# Patient Record
Sex: Male | Born: 1940 | Race: White | Hispanic: No | Marital: Married | Smoking: Never smoker
Health system: Southern US, Community
[De-identification: ages and names within clinical notes are randomized; demographics above are authoritative.]

## PROBLEM LIST (undated history)

## (undated) DIAGNOSIS — M1990 Unspecified osteoarthritis, unspecified site: Secondary | ICD-10-CM

## (undated) DIAGNOSIS — R609 Edema, unspecified: Secondary | ICD-10-CM

## (undated) DIAGNOSIS — F419 Anxiety disorder, unspecified: Secondary | ICD-10-CM

## (undated) DIAGNOSIS — Z9221 Personal history of antineoplastic chemotherapy: Secondary | ICD-10-CM

## (undated) DIAGNOSIS — H269 Unspecified cataract: Secondary | ICD-10-CM

## (undated) DIAGNOSIS — C099 Malignant neoplasm of tonsil, unspecified: Secondary | ICD-10-CM

## (undated) DIAGNOSIS — B370 Candidal stomatitis: Secondary | ICD-10-CM

## (undated) DIAGNOSIS — H9190 Unspecified hearing loss, unspecified ear: Secondary | ICD-10-CM

## (undated) DIAGNOSIS — C4491 Basal cell carcinoma of skin, unspecified: Secondary | ICD-10-CM

## (undated) DIAGNOSIS — Z923 Personal history of irradiation: Secondary | ICD-10-CM

## (undated) DIAGNOSIS — J189 Pneumonia, unspecified organism: Secondary | ICD-10-CM

## (undated) DIAGNOSIS — C779 Secondary and unspecified malignant neoplasm of lymph node, unspecified: Secondary | ICD-10-CM

## (undated) DIAGNOSIS — G709 Myoneural disorder, unspecified: Secondary | ICD-10-CM

## (undated) DIAGNOSIS — Z87438 Personal history of other diseases of male genital organs: Secondary | ICD-10-CM

## (undated) DIAGNOSIS — J329 Chronic sinusitis, unspecified: Secondary | ICD-10-CM

## (undated) DIAGNOSIS — K1230 Oral mucositis (ulcerative), unspecified: Secondary | ICD-10-CM

## (undated) DIAGNOSIS — N289 Disorder of kidney and ureter, unspecified: Secondary | ICD-10-CM

## (undated) DIAGNOSIS — Z86010 Personal history of colonic polyps: Secondary | ICD-10-CM

## (undated) DIAGNOSIS — I10 Essential (primary) hypertension: Secondary | ICD-10-CM

## (undated) DIAGNOSIS — T8859XA Other complications of anesthesia, initial encounter: Secondary | ICD-10-CM

## (undated) DIAGNOSIS — E782 Mixed hyperlipidemia: Secondary | ICD-10-CM

## (undated) DIAGNOSIS — K219 Gastro-esophageal reflux disease without esophagitis: Secondary | ICD-10-CM

## (undated) DIAGNOSIS — T7840XA Allergy, unspecified, initial encounter: Secondary | ICD-10-CM

## (undated) HISTORY — DX: Allergy, unspecified, initial encounter: T78.40XA

## (undated) HISTORY — DX: Mixed hyperlipidemia: E78.2

## (undated) HISTORY — DX: Chronic sinusitis, unspecified: J32.9

## (undated) HISTORY — DX: Disorder of kidney and ureter, unspecified: N28.9

## (undated) HISTORY — DX: Basal cell carcinoma of skin, unspecified: C44.91

## (undated) HISTORY — DX: Malignant neoplasm of tonsil, unspecified: C09.9

## (undated) HISTORY — PX: POLYPECTOMY: SHX149

## (undated) HISTORY — DX: Personal history of colonic polyps: Z86.010

## (undated) HISTORY — DX: Candidal stomatitis: B37.0

## (undated) HISTORY — PX: BLEPHAROPLASTY: SUR158

## (undated) HISTORY — DX: Edema, unspecified: R60.9

## (undated) HISTORY — DX: Unspecified osteoarthritis, unspecified site: M19.90

## (undated) HISTORY — DX: Unspecified cataract: H26.9

## (undated) HISTORY — DX: Essential (primary) hypertension: I10

## (undated) HISTORY — PX: CATARACT EXTRACTION, BILATERAL: SHX1313

## (undated) HISTORY — DX: Oral mucositis (ulcerative), unspecified: K12.30

---

## 2002-05-07 ENCOUNTER — Encounter: Payer: Self-pay | Admitting: Urology

## 2002-05-07 ENCOUNTER — Encounter: Admission: RE | Admit: 2002-05-07 | Discharge: 2002-05-07 | Payer: Self-pay | Admitting: Urology

## 2008-05-01 ENCOUNTER — Emergency Department (HOSPITAL_COMMUNITY): Admission: EM | Admit: 2008-05-01 | Discharge: 2008-05-01 | Payer: Self-pay | Admitting: Emergency Medicine

## 2009-07-14 ENCOUNTER — Encounter: Payer: Self-pay | Admitting: Internal Medicine

## 2009-07-14 ENCOUNTER — Encounter (INDEPENDENT_AMBULATORY_CARE_PROVIDER_SITE_OTHER): Payer: Self-pay | Admitting: *Deleted

## 2009-07-26 ENCOUNTER — Ambulatory Visit (HOSPITAL_COMMUNITY): Admission: RE | Admit: 2009-07-26 | Discharge: 2009-07-26 | Payer: Self-pay | Admitting: Emergency Medicine

## 2009-07-26 ENCOUNTER — Encounter: Payer: Self-pay | Admitting: Internal Medicine

## 2009-08-01 ENCOUNTER — Encounter (INDEPENDENT_AMBULATORY_CARE_PROVIDER_SITE_OTHER): Payer: Self-pay | Admitting: *Deleted

## 2009-08-04 ENCOUNTER — Ambulatory Visit: Payer: Self-pay | Admitting: Internal Medicine

## 2009-08-21 ENCOUNTER — Ambulatory Visit: Payer: Self-pay | Admitting: Internal Medicine

## 2009-08-25 DIAGNOSIS — R1013 Epigastric pain: Secondary | ICD-10-CM | POA: Insufficient documentation

## 2009-08-27 ENCOUNTER — Encounter: Payer: Self-pay | Admitting: Internal Medicine

## 2009-08-28 ENCOUNTER — Ambulatory Visit (HOSPITAL_COMMUNITY): Admission: RE | Admit: 2009-08-28 | Discharge: 2009-08-28 | Payer: Self-pay | Admitting: Internal Medicine

## 2009-08-30 HISTORY — PX: COLONOSCOPY: SHX174

## 2011-06-02 LAB — COMPREHENSIVE METABOLIC PANEL
Alkaline Phosphatase: 79
BUN: 15
Glucose, Bld: 96
Potassium: 3.8
Total Protein: 6.8

## 2011-06-02 LAB — POCT I-STAT, CHEM 8
HCT: 45
Hemoglobin: 15.3
Potassium: 3.6
Sodium: 131 — ABNORMAL LOW
TCO2: 29

## 2011-06-02 LAB — CBC
HCT: 43.4
Hemoglobin: 14.7
MCHC: 33.8
RDW: 12.8

## 2011-06-02 LAB — POCT CARDIAC MARKERS
CKMB, poc: 1.4
Myoglobin, poc: 89.6
Troponin i, poc: <0.05

## 2011-06-02 LAB — DIFFERENTIAL
Basophils Absolute: 0.1
Eosinophils Absolute: 0.1
Eosinophils Relative: 1
Lymphocytes Relative: 10 — ABNORMAL LOW
Lymphs Abs: 1
Neutrophils Relative %: 81 — ABNORMAL HIGH

## 2011-09-17 LAB — PULMONARY FUNCTION TEST

## 2012-06-22 ENCOUNTER — Encounter: Payer: Self-pay | Admitting: *Deleted

## 2012-06-23 ENCOUNTER — Encounter: Payer: Self-pay | Admitting: Cardiovascular Disease

## 2012-06-23 ENCOUNTER — Ambulatory Visit (INDEPENDENT_AMBULATORY_CARE_PROVIDER_SITE_OTHER): Payer: Medicare Other | Admitting: Cardiovascular Disease

## 2012-06-23 VITALS — BP 149/74 | HR 60 | Resp 18 | Ht 69.0 in | Wt 190.0 lb

## 2012-06-23 DIAGNOSIS — R1013 Epigastric pain: Secondary | ICD-10-CM

## 2012-06-23 DIAGNOSIS — E785 Hyperlipidemia, unspecified: Secondary | ICD-10-CM

## 2012-06-23 DIAGNOSIS — I10 Essential (primary) hypertension: Secondary | ICD-10-CM

## 2012-06-23 LAB — BASIC METABOLIC PANEL
Calcium: 9.1 mg/dL (ref 8.4–10.5)
GFR: 77.24 mL/min (ref >60.00)
Glucose, Bld: 107 mg/dL — ABNORMAL HIGH (ref 70–99)
Potassium: 3.9 mEq/L (ref 3.5–5.1)
Sodium: 132 mEq/L — ABNORMAL LOW (ref 135–145)

## 2012-06-23 LAB — LIPID PANEL
HDL: 33.6 mg/dL — ABNORMAL LOW (ref >39.00)
LDL Cholesterol: 58 mg/dL (ref 0–99)
Total CHOL/HDL Ratio: 3
VLDL: 20.6 mg/dL (ref 0.0–40.0)

## 2012-06-23 LAB — HEPATIC FUNCTION PANEL
Albumin: 3.9 g/dL (ref 3.5–5.2)
Alkaline Phosphatase: 53 U/L (ref 39–117)
Bilirubin, Direct: 0.1 mg/dL (ref 0.0–0.3)
Total Bilirubin: 0.4 mg/dL (ref 0.3–1.2)

## 2012-06-23 MED ORDER — HYDROCHLOROTHIAZIDE 25 MG PO TABS: 25.0000 mg | ORAL_TABLET | Freq: Every day | ORAL | Status: DC

## 2012-06-23 MED ORDER — POTASSIUM CHLORIDE ER 10 MEQ PO TBCR: 10.0000 meq | EXTENDED_RELEASE_TABLET | Freq: Every day | ORAL | Status: DC

## 2012-06-23 NOTE — Assessment & Plan Note (Signed)
Omar Sawyer seems  to be doing well. His blood pressure is a little bit elevated today. We will increase his HCTZ to 25 mg a day. I would like to add potassium chloride 10 mEq a day.  We discussed the importance of starting a regular exercise program.  We will Draw fasting lab work including lipid profile, hepatic profile, and basic metabolic profile. I'll see him in 3 months for followup office visit and basic metabolic profile.

## 2012-06-23 NOTE — Patient Instructions (Addendum)
Your physician recommends that you schedule a follow-up appointment in: 3 Months  Your physician has recommended you make the following change in your medication:   INCREASE HCTZ TO 25MG  IN THE MORNING START POTASSIUM DAILY WITH HCTZ  Your physician recommends that you return for lab work in: TODAY AND IN 3 MONTHS/BMET  REDUCE HIGH SODIUM FOODS LIKE CANNED SOUP, GRAVY, SAUCES, READY PREPARED FOODS LIKE FROZEN FOODS; LEAN CUISINE, LASAGNA. BACON, SAUSAGE, LUNCH MEAT, FAST FOODS.Marland Kitchen   DASH Diet The DASH diet stands for "Dietary Approaches to Stop Hypertension." It is a healthy eating plan that has been shown to reduce high blood pressure (hypertension) in as little as 14 days, while also possibly providing other significant health benefits. These other health benefits include reducing the risk of breast cancer after menopause and reducing the risk of type 2 diabetes, heart disease, colon cancer, and stroke. Health benefits also include weight loss and slowing kidney failure in patients with chronic kidney disease.  DIET GUIDELINES  Limit salt (sodium). Your diet should contain less than 1500 mg of sodium daily.  Limit refined or processed carbohydrates. Your diet should include mostly whole grains. Desserts and added sugars should be used sparingly.  Include small amounts of heart-healthy fats. These types of fats include nuts, oils, and tub margarine. Limit saturated and trans fats. These fats have been shown to be harmful in the body. CHOOSING FOODS  The following food groups are based on a 2000 calorie diet. See your Registered Dietitian for individual calorie needs. Grains and Grain Products (6 to 8 servings daily)  Eat More Often: Whole-wheat bread, brown rice, whole-grain or wheat pasta, quinoa, popcorn without added fat or salt (air popped).  Eat Less Often: White bread, white pasta, white rice, cornbread. Vegetables (4 to 5 servings daily)  Eat More Often: Fresh, frozen, and  canned vegetables. Vegetables may be raw, steamed, roasted, or grilled with a minimal amount of fat.  Eat Less Often/Avoid: Creamed or fried vegetables. Vegetables in a cheese sauce. Fruit (4 to 5 servings daily)  Eat More Often: All fresh, canned (in natural juice), or frozen fruits. Dried fruits without added sugar. One hundred percent fruit juice ( cup [237 mL] daily).  Eat Less Often: Dried fruits with added sugar. Canned fruit in light or heavy syrup. [employer], Fish, and Poultry (2 servings or less daily. One serving is 3 to 4 oz [85-114 g]).  Eat More Often: Ninety percent or leaner ground beef, tenderloin, sirloin. Round cuts of beef, chicken breast, [place] breast. All fish. Grill, bake, or broil your meat. Nothing should be fried.  Eat Less Often/Avoid: Fatty cuts of meat, [place], or chicken leg, thigh, or wing. Fried cuts of meat or fish. Dairy (2 to 3 servings)  Eat More Often: Low-fat or fat-free milk, low-fat plain or light yogurt, reduced-fat or part-skim cheese.  Eat Less Often/Avoid: Milk (whole, 2%).Whole milk yogurt. Full-fat cheeses. Nuts, Seeds, and Legumes (4 to 5 servings per week)  Eat More Often: All without added salt.  Eat Less Often/Avoid: Salted nuts and seeds, canned beans with added salt. Fats and Sweets (limited)  Eat More Often: Vegetable oils, tub margarines without trans fats, sugar-free gelatin. Mayonnaise and salad dressings.  Eat Less Often/Avoid: Coconut oils, palm oils, butter, stick margarine, cream, half and half, cookies, candy, pie. FOR MORE INFORMATION The Dash Diet Eating Plan: www.dashdiet.org Document Released: 08/05/2011 Document Revised: 11/08/2011 Document Reviewed: 08/05/2011 [facility] Patient Information 2013 [place], [place].

## 2012-06-23 NOTE — Progress Notes (Signed)
    Omar Sawyer Date of Birth  1940/12/15       Omar Sawyer    [facility] [address]  [address] [place], [place]  [address]   [place], [place]  [address] [phone]     [phone]   Fax  [phone]    Fax [phone]  Problem List: 1. Hypertension 2. Hyperlipidemia  History of Present Illness:  Omar Sawyer is a 71 year old gentleman with a long history of hypertension and hyperlipidemia. He's the son of a previous patient of mine. [name].  He presents today for continued management of his hypertension.  He avoids salt.  He does eat out quite a bit.  He is still working Retail banker).  He does not get any regular exercise.    He denies any chest pain or dyspnea with exertion.    Current Outpatient Prescriptions on File Prior to Visit  Medication Sig Dispense Refill  . amLODipine (NORVASC) 5 MG tablet Take 5 mg by mouth daily.       . hydrochlorothiazide (MICROZIDE) 12.5 MG capsule Take 12.5 mg by mouth daily.       Marland Kitchen lisinopril (PRINIVIL,ZESTRIL) 20 MG tablet       . metoprolol succinate (TOPROL-XL) 50 MG 24 hr tablet Take 50 mg by mouth daily. Take 1 1/2 tablets by mouth daily.      Marland Kitchen omeprazole (PRILOSEC) 20 MG capsule Take 20 mg by mouth daily.       . sildenafil (VIAGRA) 100 MG tablet Take 100 mg by mouth daily as needed.      . simvastatin (ZOCOR) 20 MG tablet Take 1 tablet by mouth daily.        No Known Allergies  Past Medical History  Diagnosis Date  . Essential hypertension, benign   . Mixed hyperlipidemia   . Unspecified sinusitis (chronic)   . Wheezing   . Edema   . Shortness of breath     No past surgical history on file.  History  Smoking status  . Never Smoker   Smokeless tobacco  . Not on file    History  Alcohol Use: Not on file    No family history on file.  Reviw of Systems:  Reviewed in the HPI.  All other systems are negative.  Physical Exam: Blood pressure 149/74, pulse 60,  resp. rate 18, height 5\' 9"  (1.753 m), weight 190 lb (86.183 kg), SpO2 98.00%. General: Well developed, well nourished, in no acute distress.  Head: Normocephalic, atraumatic, sclera non-icteric, mucus membranes are moist,   Neck: Supple. Carotids are 2 + without bruits. No JVD  Lungs: Clear bilaterally to auscultation.  Heart: regular rate.  normal  S1 S2. No murmurs, gallops or rubs.  Abdomen: Soft, non-tender, non-distended with normal bowel sounds. No hepatomegaly. No rebound/guarding. No masses.  Msk:  Strength and tone are normal  Extremities: No clubbing or cyanosis. No edema.  Distal pedal pulses are 2+ and equal bilaterally.  Neuro: Alert and oriented X 3. Moves all extremities spontaneously.  Psych:  Responds to questions appropriately with a normal affect.  ECG: 06/23/2012-normal sinus rhythm at 60 beats a minute. He has poor R-wave progression that is probably due to lead placement. His EKG is otherwise normal.  Assessment / Plan:

## 2012-07-03 NOTE — Progress Notes (Signed)
Received another copy

## 2012-07-03 NOTE — Progress Notes (Signed)
Received another copy 

## 2012-07-06 ENCOUNTER — Other Ambulatory Visit: Payer: Self-pay | Admitting: Internal Medicine

## 2012-07-07 ENCOUNTER — Encounter: Payer: Self-pay | Admitting: Internal Medicine

## 2012-07-23 ENCOUNTER — Encounter: Payer: Self-pay | Admitting: Internal Medicine

## 2012-07-23 DIAGNOSIS — Z860100 Personal history of colon polyps, unspecified: Secondary | ICD-10-CM

## 2012-07-23 HISTORY — DX: Personal history of colonic polyps: Z86.010

## 2012-07-23 HISTORY — DX: Personal history of colon polyps, unspecified: Z86.0100

## 2012-09-28 ENCOUNTER — Encounter: Payer: Self-pay | Admitting: Cardiovascular Disease

## 2012-09-28 ENCOUNTER — Ambulatory Visit (INDEPENDENT_AMBULATORY_CARE_PROVIDER_SITE_OTHER): Payer: Medicare Other | Admitting: Cardiovascular Disease

## 2012-09-28 VITALS — BP 110/68 | HR 78 | Ht 69.0 in | Wt 192.6 lb

## 2012-09-28 DIAGNOSIS — E785 Hyperlipidemia, unspecified: Secondary | ICD-10-CM

## 2012-09-28 DIAGNOSIS — I10 Essential (primary) hypertension: Secondary | ICD-10-CM

## 2012-09-28 LAB — BASIC METABOLIC PANEL
BUN: 18 mg/dL (ref 6–23)
GFR: 62.07 mL/min (ref >60.00)
Potassium: 3.7 mEq/L (ref 3.5–5.1)

## 2012-09-28 NOTE — Progress Notes (Signed)
Maureen Ralphs Date of Birth  March 31, 1941       [facility]    [place] [address]  [address] [place], [place]  [address]   [place], [place]  [address] [phone]     [phone]   Fax  [phone]    Fax [phone]  Problem List: 1. Hypertension 2. Hyperlipidemia  History of Present Illness:  Densel is a 72 year old gentleman with a long history of hypertension and hyperlipidemia. He's the son of a previous patient of mine. [name].  He presents today for continued management of his hypertension.  He avoids salt.  He does eat out quite a bit.  He is still working Retail banker).  He does not get any regular exercise.    He denies any chest pain or dyspnea with exertion.    September 28, 2012: He was started on HCTZ and his BP has been better.  He is having some prostate issues.  He denies any CP, syncope or presyncope.  Current Outpatient Prescriptions on File Prior to Visit  Medication Sig Dispense Refill  . amLODipine (NORVASC) 5 MG tablet Take 5 mg by mouth daily.       . Cholecalciferol (VITAMIN D-3) 1000 UNITS CAPS Take 1 tablet by mouth daily.      . fish oil-omega-3 fatty acids 1000 MG capsule Take 2 g by mouth daily.      . hydrochlorothiazide (HYDRODIURIL) 25 MG tablet Take 1 tablet (25 mg total) by mouth daily.  90 tablet  3  . lisinopril (PRINIVIL,ZESTRIL) 20 MG tablet Take 20 mg by mouth daily.       . metoprolol succinate (TOPROL-XL) 50 MG 24 hr tablet Take 50 mg by mouth daily. Take 1 1/2 tablets by mouth daily.      Marland Kitchen omeprazole (PRILOSEC) 20 MG capsule Take 20 mg by mouth daily.       . potassium chloride (K-DUR) 10 MEQ tablet Take 1 tablet (10 mEq total) by mouth daily.  30 tablet  6  . sildenafil (VIAGRA) 100 MG tablet Take 100 mg by mouth daily as needed.      . simvastatin (ZOCOR) 20 MG tablet Take 1 tablet by mouth daily.      . Tamsulosin HCl (FLOMAX) 0.4 MG CAPS Take 0.4 mg by mouth daily.          No Known Allergies  Past Medical History  Diagnosis Date  . Essential hypertension, benign   . Mixed hyperlipidemia   . Unspecified sinusitis (chronic)   . Wheezing   . Edema   . Shortness of breath   . Personal history of adenomatous colonic polyps 07/23/2012    2010    Past Surgical History  Procedure Date  . Colonoscopy     History  Smoking status  . Never Smoker   Smokeless tobacco  . Not on file    History  Alcohol Use: Not on file    No family history on file.  Reviw of Systems:  Reviewed in the HPI.  All other systems are negative.  Physical Exam: Blood pressure 110/68, pulse 78, height 5\' 9"  (1.753 m), weight 192 lb 9.6 oz (87.363 kg), SpO2 96.00%. General: Well developed, well nourished, in no acute distress.  Head: Normocephalic, atraumatic, sclera non-icteric, mucus membranes are moist,   Neck: Supple. Carotids are 2 + without bruits. No JVD  Lungs: Clear bilaterally to auscultation.  Heart: regular rate.  normal  S1 S2. No murmurs, gallops or rubs.  Abdomen: Soft, non-tender, non-distended with normal bowel sounds. No hepatomegaly. No rebound/guarding. No masses.  Msk:  Strength and tone are normal  Extremities: No clubbing or cyanosis. No edema.  Distal pedal pulses are 2+ and equal bilaterally.  Neuro: Alert and oriented X 3. Moves all extremities spontaneously.  Psych:  Responds to questions appropriately with a normal affect.  ECG:  Assessment / Plan:

## 2012-09-28 NOTE — Assessment & Plan Note (Signed)
He's doing very well from a cardiac standpoint. His blood pressure is well-controlled. He is tolerating the HCTZ and potassium. We'll send him for basic metabolic profile today. I seen again in 6 months for followup office visit.  We'll check a fasting lipid profile, lipid profile, and basic metabolic profile

## 2012-09-28 NOTE — Assessment & Plan Note (Signed)
We will check a fasting lipid profile, liver enzymes, and BMP in 6 months.

## 2012-09-28 NOTE — Patient Instructions (Addendum)
Your physician wants you to follow-up in: 6 Months You will receive a reminder letter in the mail two months in advance. If you don't receive a letter, please call our office to schedule the follow-up appointment.  Your physician recommends that you return for a FASTING lipid profile: 6 months  Your physician recommends that you continue on your current medications as directed. Please refer to the Current Medication list given to you today.  Your physician recommends that you return for a FASTING lipid profile: TODAY

## 2012-09-29 ENCOUNTER — Telehealth: Payer: Self-pay | Admitting: Cardiovascular Disease

## 2012-09-29 NOTE — Telephone Encounter (Signed)
Pt rtn call to jodette

## 2012-10-03 ENCOUNTER — Other Ambulatory Visit: Payer: Self-pay | Admitting: Otolaryngology

## 2012-10-03 HISTORY — PX: OTHER SURGICAL HISTORY: SHX169

## 2012-10-03 NOTE — Telephone Encounter (Signed)
PT WAS CALLED WITH LAB RESULTS

## 2012-10-14 ENCOUNTER — Other Ambulatory Visit: Payer: Self-pay

## 2012-10-18 ENCOUNTER — Other Ambulatory Visit (HOSPITAL_COMMUNITY): Payer: Self-pay | Admitting: Otolaryngology

## 2012-10-18 DIAGNOSIS — IMO0002 Reserved for concepts with insufficient information to code with codable children: Secondary | ICD-10-CM

## 2012-10-26 ENCOUNTER — Ambulatory Visit (HOSPITAL_COMMUNITY)
Admission: RE | Admit: 2012-10-26 | Discharge: 2012-10-26 | Disposition: A | Discharge status: Home / Self Care | Payer: Medicare Other | Source: Ambulatory Visit | Attending: Otolaryngology | Admitting: Otolaryngology

## 2012-10-26 ENCOUNTER — Encounter (HOSPITAL_COMMUNITY): Payer: Self-pay

## 2012-10-26 ENCOUNTER — Encounter: Payer: Self-pay | Admitting: Radiation Oncology

## 2012-10-26 ENCOUNTER — Encounter (HOSPITAL_COMMUNITY)
Admission: RE | Admit: 2012-10-26 | Discharge: 2012-10-26 | Disposition: A | Discharge status: Home / Self Care | Payer: Medicare Other | Source: Ambulatory Visit | Attending: Otolaryngology | Admitting: Otolaryngology

## 2012-10-26 DIAGNOSIS — C099 Malignant neoplasm of tonsil, unspecified: Secondary | ICD-10-CM | POA: Insufficient documentation

## 2012-10-26 DIAGNOSIS — C770 Secondary and unspecified malignant neoplasm of lymph nodes of head, face and neck: Secondary | ICD-10-CM | POA: Insufficient documentation

## 2012-10-26 DIAGNOSIS — IMO0002 Reserved for concepts with insufficient information to code with codable children: Secondary | ICD-10-CM

## 2012-10-26 LAB — GLUCOSE, CAPILLARY: Glucose-Capillary: 99 mg/dL (ref 70–99)

## 2012-10-26 MED ORDER — FLUDEOXYGLUCOSE F - 18 (FDG) INJECTION
19.2000 | Freq: Once | INTRAVENOUS | Status: AC | PRN
  Administered 2012-10-26: 19.2 via INTRAVENOUS

## 2012-10-26 MED ORDER — IOHEXOL 300 MG/ML  SOLN: 100.0000 mL | Freq: Once | INTRAMUSCULAR | Status: AC | PRN

## 2012-10-27 ENCOUNTER — Encounter: Payer: Self-pay | Admitting: Radiation Oncology

## 2012-10-27 ENCOUNTER — Ambulatory Visit
Admission: RE | Admit: 2012-10-27 | Discharge: 2012-10-27 | Disposition: A | Discharge status: Home / Self Care | Payer: Medicare Other | Source: Ambulatory Visit | Attending: Radiation Oncology | Admitting: Radiation Oncology

## 2012-10-27 ENCOUNTER — Telehealth: Payer: Self-pay | Admitting: Oncology

## 2012-10-27 VITALS — BP 151/61 | HR 68 | Temp 97.7°F | Resp 20 | Ht 69.0 in | Wt 195.9 lb

## 2012-10-27 DIAGNOSIS — C099 Malignant neoplasm of tonsil, unspecified: Secondary | ICD-10-CM

## 2012-10-27 DIAGNOSIS — Z79899 Other long term (current) drug therapy: Secondary | ICD-10-CM | POA: Insufficient documentation

## 2012-10-27 DIAGNOSIS — B977 Papillomavirus as the cause of diseases classified elsewhere: Secondary | ICD-10-CM | POA: Insufficient documentation

## 2012-10-27 DIAGNOSIS — I10 Essential (primary) hypertension: Secondary | ICD-10-CM | POA: Insufficient documentation

## 2012-10-27 DIAGNOSIS — K219 Gastro-esophageal reflux disease without esophagitis: Secondary | ICD-10-CM | POA: Insufficient documentation

## 2012-10-27 DIAGNOSIS — C779 Secondary and unspecified malignant neoplasm of lymph node, unspecified: Secondary | ICD-10-CM | POA: Insufficient documentation

## 2012-10-27 DIAGNOSIS — E782 Mixed hyperlipidemia: Secondary | ICD-10-CM | POA: Insufficient documentation

## 2012-10-27 DIAGNOSIS — C090 Malignant neoplasm of tonsillar fossa: Secondary | ICD-10-CM | POA: Insufficient documentation

## 2012-10-27 HISTORY — DX: Unspecified hearing loss, unspecified ear: H91.90

## 2012-10-27 HISTORY — DX: Secondary and unspecified malignant neoplasm of lymph node, unspecified: C77.9

## 2012-10-27 HISTORY — DX: Gastro-esophageal reflux disease without esophagitis: K21.9

## 2012-10-27 HISTORY — DX: Anxiety disorder, unspecified: F41.9

## 2012-10-27 HISTORY — DX: Myoneural disorder, unspecified: G70.9

## 2012-10-27 HISTORY — DX: Personal history of other diseases of male genital organs: Z87.438

## 2012-10-27 NOTE — Telephone Encounter (Signed)
C/D 10/27/12 for appt. 11/02/12

## 2012-10-27 NOTE — Progress Notes (Signed)
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

## 2012-10-27 NOTE — Progress Notes (Signed)
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

## 2012-10-27 NOTE — Progress Notes (Signed)
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 male who presented with symptoms suggestive of persistent sinusitis. He tried different rounds of antibiotics, but in spite of these he had persistent tooth, maxillary pain and retro-orbital pain. These symptoms were mainly on the left side. He was seen by Dr. Jearld Fenton of otolaryngology. At assessment, the patient also had some mild sore throat. The patient was examined and found to have an erythematous right tonsil with exudate on its surface. It was much larger than the left tonsil. This examination was on 09/15/2012. The patient was seen for reassessment on 09/29/2012. At that time his sinus symptoms had improved with clindamycin. However his right tonsil was still irregular and enlarged. The patient was scheduled for biopsy. This took place on February 4, revealing strongly/diffusely HPV positive squamous cell carcinoma. PET scan performed yesterday demonstrates a right tonsil mass, 4.0 cm in greatest dimension, with evidence of level II and level III adenopathy in the right neck  The patient denies any history of tobacco abuse. He reports that he did treat with heavily over 20 years ago. He reports that he has had a soreness in his right throat that waxes and wanes. It is not severe. He reports that his left sinusitis symptoms have gone away. He denies any dysphagia or ear pain. He did have  a gastrointestinal virus in January and lost 15 pounds at that time period... has not gained that weight weight back. He has bilateral hearing loss after serving in the [employer] and does have bilateral hearing aids. He denies any  prior cancers or radiotherapy.  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    PAST SURGICAL HISTORY: Past Surgical History  Procedure Laterality Date  . Colonoscopy    . Biopsy of right tonsil Right 10/03/2012    Squamous Cell Carcinoma     . Blepharoplasty      FAMILY HISTORY: family history includes Diabetes in his mother; Heart attack in an unspecified family member; Heart disease in his mother; Hyperlipidemia in his mother; and Hypertension in his brother, father, and mother.  SOCIAL HISTORY:  reports that he has never smoked. He has never used smokeless tobacco. He reports that he does not drink alcohol or use illicit drugs.  ALLERGIES: Review of patient's allergies indicates no known allergies.  MEDICATIONS:  Current Outpatient Prescriptions  Medication Sig Dispense Refill  . amLODipine (NORVASC) 5 MG tablet Take 5 mg by mouth daily.       . Cholecalciferol (VITAMIN D-3) 1000 UNITS CAPS Take 1 tablet by mouth daily.      . fish oil-omega-3 fatty acids 1000 MG capsule Take 2 g by mouth daily.      . hydrochlorothiazide (HYDRODIURIL) 25 MG tablet Take 1 tablet (25 mg total) by mouth daily.  90 tablet  3  . lisinopril (PRINIVIL,ZESTRIL) 20 MG  tablet Take 20 mg by mouth daily.       . metoprolol succinate (TOPROL-XL) 50 MG 24 hr tablet Take 50 mg by mouth daily. Take 1 1/2 tablets by mouth daily.      Marland Kitchen omeprazole (PRILOSEC) 20 MG capsule Take 20 mg by mouth daily.       . potassium chloride (K-DUR) 10 MEQ tablet Take 1 tablet (10 mEq total) by mouth  daily.  30 tablet  6  . sildenafil (VIAGRA) 100 MG tablet Take 100 mg by mouth daily as needed.      . simvastatin (ZOCOR) 20 MG tablet Take 1 tablet by mouth daily.      . Tamsulosin HCl (FLOMAX) 0.4 MG CAPS Take 0.4 mg by mouth daily.       Marland Kitchen doxycycline (VIBRA-TABS) 100 MG tablet Take 100 mg by mouth 2 (two) times daily.        No current facility-administered medications for this encounter.    REVIEW OF SYSTEMS:  As above   PHYSICAL EXAM:  height is 5\' 9"  (1.753 m) and weight is 195 lb 14.4 oz (88.86 kg). His oral temperature is 97.7 F (36.5 C). His blood pressure is 151/61 and his pulse is 68. His respiration is 20 and oxygen saturation is 99%.   General: Alert and oriented, in no acute distress HEENT: Head is normocephalic. Extraocular movements are intact. Oropharynx is notable for an enlarged, erythematous right tonsil. No trismus. Tongue is midline. No palpable or visible lesions elsewhere in the oropharynx. Dentition in fairly good repair, with multiple fillings.  Neck: Slight fullness in the right level II region, otherwise no detectable lymphadenopathy in the neck Heart: Regular in rate and rhythm  Chest: Clear to auscultation bilaterally, with no rhonchi, wheezes, or rales. Abdomen: Soft, nontender, nondistended, with no rigidity or guarding. Extremities: No cyanosis or edema. Lymphatics: As above  Skin: No concerning lesions. Musculoskeletal: symmetric strength and muscle tone throughout. Neurologic: Cranial nerves II through XII are grossly intact. No obvious focalities. Speech is fluent. Coordination is intact. Slight tremor in his hands bilaterally when signing forms Psychiatric: Judgment and insight are intact. Affect is appropriate.   LABORATORY DATA:  Lab Results  Component Value Date   WBC 9.8 05/01/2008   HGB 15.3 05/01/2008   HCT 45.0 05/01/2008   MCV 91.4 05/01/2008   PLT 209 05/01/2008   CMP     Component Value Date/Time   NA 132* 09/28/2012 0916   K 3.7  09/28/2012 0916   CL 91* 09/28/2012 0916   CO2 30 09/28/2012 0916   GLUCOSE 116* 09/28/2012 0916   BUN 18 09/28/2012 0916   CREATININE 1.2 09/28/2012 0916   CALCIUM 9.2 09/28/2012 0916   PROT 7.5 06/23/2012 1037   ALBUMIN 3.9 06/23/2012 1037   AST 24 06/23/2012 1037   ALT 35 06/23/2012 1037   ALKPHOS 53 06/23/2012 1037   BILITOT 0.4 06/23/2012 1037   GFRNONAA >60 05/01/2008 1947   GFRAA  Value: >60        The eGFR has been calculated using the MDRD equation. This calculation has not been validated in all clinical 05/01/2008 1947         RADIOGRAPHY: Ct Soft Tissue Neck W Contrast  10/26/2012  *RADIOLOGY REPORT*  Clinical Data: 72 year old male with right tonsil squamous cell carcinoma diagnosed this month.  CT NECK WITH CONTRAST  Technique:  Multidetector CT imaging of the neck was performed with intravenous contrast.  Contrast:  100 ml Omnipaque-300.  Comparison: PET  CT from the same day reported separately.  Findings:  Negative lung apices.  No superior mediastinal lymphadenopathy.  Negative thyroid, retropharyngeal space, left parapharyngeal space, sublingual space, submandibular glands, parotid glands, visible orbit soft tissues, and visualized brain parenchyma. Visualized paranasal sinuses and mastoids are clear. No acute osseous abnormality identified.  Incidental torus mandibularis.  Heterogeneously enhancing soft tissue mass centered at the right tonsillar pillar and inseparable from the right soft palate encompasses 29 x 27 x 40 mm (AP by transverse by CC). Associated blunting of the right glossotonsillar sulcus.  Tongue base and vallecula otherwise normal.  Epiglottis, hypopharynx and larynx are within normal limits.  Pathologic right level IIA lymph node measures 17 mm short axis (26 mm long axis).  There is also an abnormally heterogeneous lymph node at level III on the right along the posterior margin of the right sternocleidomastoid muscle measuring 8 mm short axis (23 mm long axis).  There  are intervening normal/tiny other level II and level III nodes.  No level I lymphadenopathy.  Small level IV nodes appear symmetric and within normal limits.  No left side cervical lymphadenopathy.  IMPRESSION: Right tonsil oropharyngeal mass compatible with squamous cell carcinoma measuring up to 40 mm largest dimension.  Imaging stage is T2 N2b (abnormal right level II and level III lymph nodes).   Original Report Authenticated By: Erskine Speed, M.D.    Nm Pet Image Initial (pi) Skull Base To Thigh  10/26/2012  *RADIOLOGY REPORT*  Clinical Data: Initial treatment strategy for squamous cell carcinoma of the tonsil.  NUCLEAR MEDICINE PET SKULL BASE TO THIGH  Fasting Blood Glucose:  99  Technique:  19.2 mCi F-18 FDG was injected intravenously. CT data was obtained and used for attenuation correction and anatomic localization only.  (This was not acquired as a diagnostic CT examination.) Additional exam technical data entered on technologist worksheet.  Comparison:  Next CT 10/26/2012 next the  Findings:  Neck: There is hypermetabolic right tonsillar mass measuring approximately 2.9 x 2.5 cm with SUV max = 17.6.  There is an enlarge hypermetabolic level II A lymph node measuring 18 mm short axis (image 29) with SUV max = 11.2.  There is a smaller less hypermetabolic level III lymph node on the right measuring 7 mm short axis (image 42) SUV max = 3.8.  There are no discrete hypermetabolic lymph nodes on the left.  Chest:  No hypermetabolic mediastinal or hilar nodes.  No suspicious pulmonary nodules on the CT scan.  Abdomen/Pelvis:  No abnormal hypermetabolic activity within the liver, pancreas, adrenal glands, or spleen.  No hypermetabolic lymph nodes in the abdomen or pelvis. There are small.  Several portal lymph nodes measuring less than 10 mm each (image 136) without discrete hypermetabolic activity.  Prostate gland is enlarged.  Skeleton:  No focal hypermetabolic activity to suggest skeletal metastasis.   IMPRESSION:  1.  Hypermetabolic right tonsillar mass consistent primary carcinoma. 2.  Hypermetabolic nodal metastasis to the right level IIa and level III lymph nodes. 3.  No evidence of contralateral nodal metastasis. 4.  No evidence of distant metastatic disease.   Original Report Authenticated By: Genevive Bi, M.D.       IMPRESSION/PLAN: This is a very pleasant 72 year old gentleman with clinical T2 N2b M0 right tonsil squamous cell carcinoma, HPV positive. He is an excellent candidate for radiotherapy. I discussed all of the below with the patient and his wife.  1) We will place the patient on next week's  tumor board for multidisciplinary review  2) Will refer the patient to dentistry to discuss whether he needs dental extractions, and also for trismus prevention. Patient will discuss dental hygiene/management/prophylaxis with Dr. Kristin Bruins  3) Will refer to social work for social support  4) Will refer to nutrition for nutrition support  5) Will refer to interventional radiology for PEG tube placement, as the risk of dehydration malnutrition is relatively high for patients receiving chemoradiotherapy for head and neck cancer.   6) Will refer to swallowing therapy for dysphagia prevention  7) Date of simulation TBD after discussion with Dentistry - I told the patient that I anticipate he will be planned to receive 7 weeks of radiotherapy, 35 treatments.   8) The patient was offered enrollment on our single institutional trial investigating open-faced vs close-face head/shoulder masks used to immobilize patients during head and neck radiotherapy. The patient has elected to enroll on this trial.  In regards to the trial, the patient has voluntarily signed copies of the consent forms and all trial related questions were answered.  9) I discussed with the patient the potential benefits of concurrent chemotherapy. He will be referred to medical oncology for consultation  It was a pleasure  meeting the patient today. We discussed the risks, benefits, and side effects of radiotherapy. He understands adjuvant radiotherapy will give him the best chance of cure and local regional control.  We discussed the good prognostic implications of HPV positivity in non-tobacco abusers. We talked in detail about acute and late effects. He understands that some of the most bothersome acute effects will be significant soreness of the mouth and throat, changes in taste, changes in salivary function, skin irritation, hair loss, dehydration, weight loss and fatigue. We talked about late effects which include but are not necessarily limited to dysphagia, hypothyroidism, dry mouth, trismus, nerve or spinal cord injury, dental/jaw injury, and neck edema. No guarantees of treatment were given. A consent form was signed and placed in the patient's medical record. The patient is enthusiastic about proceeding with treatment. I look forward to participating in the patient's care..   I spent 60 minutes minutes face to face with the patient and more than 50% of that time was spent in counseling and/or coordination of care.    __________________________________________   Lonie Peak, MD

## 2012-10-30 ENCOUNTER — Encounter (HOSPITAL_COMMUNITY): Payer: Self-pay | Admitting: Dentistry

## 2012-10-30 ENCOUNTER — Ambulatory Visit (HOSPITAL_COMMUNITY): Payer: Self-pay | Admitting: Dentistry

## 2012-10-30 VITALS — BP 121/67 | HR 64 | Temp 98.1°F

## 2012-10-30 DIAGNOSIS — K011 Impacted teeth: Secondary | ICD-10-CM

## 2012-10-30 DIAGNOSIS — C099 Malignant neoplasm of tonsil, unspecified: Secondary | ICD-10-CM

## 2012-10-30 DIAGNOSIS — Z0189 Encounter for other specified special examinations: Secondary | ICD-10-CM

## 2012-10-30 DIAGNOSIS — K036 Deposits [accretions] on teeth: Secondary | ICD-10-CM

## 2012-10-30 DIAGNOSIS — K0530 Chronic periodontitis, unspecified: Secondary | ICD-10-CM

## 2012-10-30 DIAGNOSIS — M264 Malocclusion, unspecified: Secondary | ICD-10-CM

## 2012-10-30 NOTE — Patient Instructions (Addendum)

## 2012-10-30 NOTE — Progress Notes (Signed)
DENTAL CONSULTATION  Date of Consultation:  10/30/2012 Patient Name:   Omar Sawyer Date of Birth:   1941/06/25 Medical Record Number: [id number]  VITALS: BP 121/67  Pulse 64  Temp(Src) 98.1 F (36.7 C) (Oral)   CHIEF COMPLAINT: Patient referred for medically necessary preradiation therapy dental protocol evaluation.  HPI: Omar Sawyer is a 72 year old male referred by Dr. Lonie Peak for a dental consultation. Patient with recent diagnosis of squamous cell carcinoma of the right tonsil. Patient with anticipated chemoradiation therapy. Patient is now seen as part of a pre-chemoradiation therapy dental protocol evaluation.   Patient currently denies acute toothache, swellings, or abscesses. Patient was last seen in January 2014 for exam and cleaning. This was with Dr. Milinda Antis. Patient is seen on an every 6 month basis. Patient has a lower left molar that needs some type of restoration.  Patient Active Problem List  Diagnosis  . ABDOMINAL PAIN, EPIGASTRIC  . HTN (hypertension)  . Hyperlipidemia  . Personal history of adenomatous colonic polyps  . Cancer  . Tonsil cancer    PMH: Past Medical History  Diagnosis Date  . Essential hypertension, benign   . Mixed hyperlipidemia   . Unspecified sinusitis (chronic)   . Wheezing   . Edema   . Shortness of breath   . Personal history of adenomatous colonic polyps 07/23/2012    2010  . Hearing loss   . Hx of prostatitis   . Hx of peripheral neuropathy   . Tonsillar cancer   . 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 Anxiety     mild new dx  . GERD (gastroesophageal reflux disease)   . Neuromuscular disorder     b/l tremors,     PSH: Past Surgical History  Procedure Laterality Date  . Colonoscopy    . Biopsy of right tonsil Right 10/03/2012     Squamous Cell Carcinoma     . Blepharoplasty      ALLERGIES:. No Known Allergies  MEDICATIONS: Current Outpatient Prescriptions  Medication Sig Dispense Refill  . amLODipine (NORVASC) 5 MG tablet Take 5 mg by mouth daily.       . Cholecalciferol (VITAMIN D-3) 1000 UNITS CAPS Take 1 tablet by mouth daily.      Marland Kitchen doxycycline (VIBRA-TABS) 100 MG tablet Take 100 mg by mouth 2 (two) times daily.       . fish oil-omega-3 fatty acids 1000 MG capsule Take 2 g by mouth daily.      . hydrochlorothiazide (HYDRODIURIL) 25 MG tablet Take 1 tablet (25 mg total) by mouth daily.  90 tablet  3  . lisinopril (PRINIVIL,ZESTRIL) 20 MG tablet Take 20 mg by mouth daily.       . metoprolol succinate (TOPROL-XL) 50 MG 24 hr tablet Take 50 mg by mouth daily. Take 1 1/2 tablets by mouth daily.      Marland Kitchen omeprazole (PRILOSEC) 20 MG capsule Take 20 mg by mouth daily.       . potassium chloride (K-DUR) 10 MEQ tablet Take 1 tablet (10 mEq total) by mouth daily.  30 tablet  6  . sildenafil (VIAGRA) 100 MG tablet Take 100 mg by mouth daily as needed.      . simvastatin (ZOCOR) 20 MG tablet Take 1 tablet  by mouth daily.      . Tamsulosin HCl (FLOMAX) 0.4 MG CAPS Take 0.4 mg by mouth daily.        No current facility-administered medications for this visit.    LABS: Lab Results  Component Value Date   WBC 9.8 05/01/2008   HGB 15.3 05/01/2008   HCT 45.0 05/01/2008   MCV 91.4 05/01/2008   PLT 209 05/01/2008      Component Value Date/Time   NA 132* 09/28/2012 0916   K 3.7 09/28/2012 0916   CL 91* 09/28/2012 0916   CO2 30 09/28/2012 0916   GLUCOSE 116* 09/28/2012 0916   BUN 18 09/28/2012 0916   CREATININE 1.2 09/28/2012 0916   CALCIUM 9.2 09/28/2012 0916   GFRNONAA >60 05/01/2008 1947   GFRAA  Value: >60        The eGFR has been calculated using the MDRD equation. This calculation has not been validated in all clinical 05/01/2008 1947   No results found for this basename: INR, PROTIME   No results found for this basename: PTT     SOCIAL HISTORY: History   Social History  . Marital Status: Married    Spouse Name: N/A    Number of Children: N/A  . Years of Education: N/A   Occupational History  . Not on file.   Social History Main Topics  . Smoking status: Never Smoker   . Smokeless tobacco: Never Used  . Alcohol Use: No  . Drug Use: No  . Sexually Active: Not on file   Other Topics Concern  . Not on file   Social History Narrative  . No narrative on file    FAMILY HISTORY: Family History  Problem Relation Age of Onset  . Heart disease Mother   . Hypertension Mother   . Diabetes Mother   . Hyperlipidemia Mother   . Hypertension Father   . Hypertension Brother   . Heart attack       REVIEW OF SYSTEMS: Reviewed with patient and postive as above.  DENTAL HISTORY: CHIEF COMPLAINT: Patient referred for medically necessary preradiation therapy dental protocol evaluation.  HPI: Omar Sawyer is a 72 year old male referred by Dr. Lonie Peak for a dental consultation. Patient with recent diagnosis of squamous cell carcinoma of the right tonsil. Patient with anticipated chemoradiation therapy. Patient is now seen as part of a pre-chemoradiation therapy dental protocol evaluation.   Patient currently denies acute toothache, swellings, or abscesses. Patient was last seen in January 2014 for exam and cleaning. This was with Dr. Milinda Antis. Patient is seen on an every 6 month basis. Patient has a lower left molar that needs some type of restoration.  DENTAL EXAMINATION:  GENERAL:  The patient is a well-developed, well-nourished male in no acute distress. HEAD AND NECK: Patient with right neck lymphadenopathy. No left neck lymphadenopathy is palpated The patient denies acute TMJ symptoms. INTRAORAL EXAM:Patient with normal saliva. Patient has large, multilobular mandibular lingual tori. DENTITION: Patient is missing tooth numbers 16 and 17. Tooth numbers 1 and 32 are impacted.   PERIODONTAL: Patient with chronic periodontitis with minimal plaque accumulations. There is incipient to moderate bone loss. No excessive tooth mobility is noted. DENTAL CARIES/SUBOPTIMAL RESTORATIONS: Tooth #19 with a distal buccal fractured cusp in need of restoration. ENDODONTIC:  The patient currently denies acute pulpitis symptoms. There is no evidence of periapical pathology.  CROWN AND BRIDGE: The patient has several crown restorations that appear to be acceptable.  PROSTHODONTIC: There  are no partial dentures.  OCCLUSION: Patient with a poor occlusal scheme secondary to multiple malpositioned teeth. The occlusion is stable however.  RADIOGRAPHIC INTERPRETATION: An orthopantogram was obtained along with a full series of dental radiographs.  There are missing tooth numbers 16 and 17. Patient has impacted tooth numbers 1 and 32. There is incipient to moderate bone loss. There are radiopacities consistent with the large, multilobular mandibular lingual tori involving the mandibular arch.  ASSESSMENTS: 1. Chronic periodontitis with bone loss 2. Accretions-minimal  3. Selective areas of gingival recession 4. Lack of attached gingiva in the mandibular anterior area 5. No significant tooth mobility 6. Missing tooth numbers 16 and 17 7. Impacted tooth numbers 1 and 32 8. Large bilateral mandibular lingual tori 9. deep overbite 10. Multiple malpositioned teeth. 11. Poor occlusal scheme but stable occlusion 12. Defect of distal buccal cusp of restoration #19   PLAN/RECOMMENDATIONS: 1. I discussed the risks, benefits, and complications of various treatment options with the patient in relationship to his medical and dental conditions, anticipated chemoradiation therapy, and chemoradiation therapy side effects to include xerostomia, mucositis, taste changes, gum and jawbone changes, and risk for infection, bleeding, and osteonecrosis. We discussed various treatment options to include no  treatment, extraction of teeth in primary field of radiation therapy with alveoloplasty, referral to an oral surgeon for second opinion concerning extraction of teeth in the primary field of radiation therapy, pre-prosthetic surgery as indicated, periodontal therapy, dental restorations, root canal therapy, crown and bridge therapy, implant therapy, and replacement of missing teeth as indicated. We also discussed obtaining impressions for fabrication of fluoride trays and scatter protection devices.The patient currently wishes to proceed with impressions today for the future fabrication of fluoride trays and scatter protection devices. The patient is thinking about obtaining a second opinion from an oral surgeon concerning extraction of teeth in primary field iof radiation therapy.  I have also left a message with Dr. Shelda Altes (primary Dentist) to obtain her opinion concerning whether teeth in the primary field radiation therapy should be extracted at this time.   2. Discussion of findings with medical team and coordination of future medical and dental care as indicated.  Charlynne Pander, DDS

## 2012-10-31 ENCOUNTER — Encounter (HOSPITAL_COMMUNITY): Payer: Self-pay | Admitting: Dentistry

## 2012-10-31 ENCOUNTER — Ambulatory Visit (HOSPITAL_COMMUNITY): Payer: Self-pay | Admitting: Dentistry

## 2012-10-31 ENCOUNTER — Other Ambulatory Visit: Payer: Self-pay | Admitting: Radiology

## 2012-10-31 VITALS — BP 125/77 | HR 61 | Temp 97.6°F

## 2012-10-31 DIAGNOSIS — C099 Malignant neoplasm of tonsil, unspecified: Secondary | ICD-10-CM

## 2012-10-31 DIAGNOSIS — Z463 Encounter for fitting and adjustment of dental prosthetic device: Secondary | ICD-10-CM

## 2012-10-31 DIAGNOSIS — Z0189 Encounter for other specified special examinations: Secondary | ICD-10-CM

## 2012-10-31 MED ORDER — SODIUM FLUORIDE 1.1 % DT GEL: DENTAL | Status: AC

## 2012-10-31 NOTE — Progress Notes (Signed)
10/31/2012  Patient:            Omar Sawyer Date of Birth:  Mar 07, 1941 MRN:                [id number]  BP 125/77  Pulse 61  Temp(Src) 97.6 F (36.4 C) (Oral)  Omar Sawyer now presents for insertion of upper lower fluoride trays and scatter protection devices.  PROCEDURE: Appliances were tried in and adjusted as needed. [place]. Postop instructions were provided and a written and verbal format concerning the use and care of appliances. All questions were answered. Patient to return to clinic for periodic oral examination in approximately 2-3 weeks during radiation therapy. Patient to call if questions or problems arise before then.   Charlynne Pander, DDS

## 2012-10-31 NOTE — Patient Instructions (Signed)
FLUORIDE TRAYS PATIENT INSTRUCTIONS    Obtain prescription from the pharmacy.  Don't be surprised if it needs to be ordered.   Be sure to let the pharmacy know when you are close to needing a new refill for them to have it ready for you without interruption of Fluoride use.   The best time to use your Fluoride is before bed time.   You must brush your teeth very well and floss before using the Fluoride in order to get the best use out of the Fluoride treatments.   Place 1 drop of Fluoride gel per tooth in the tray.   Place the tray on your lower teeth and/or your upper teeth.  Make sure the trays are seated all the way.  Remember, they only fit one way on your teeth.   Insert for 5 full minutes.   At the end of the 5 minutes, take the trays out.  SPIT OUT excess. .    Do NOT rinse your mouth!    Do NOT eat or drink after treatments for at least 30 minutes.  This is why the best time for your treatments is before bedtime.    Clean the inside of your Fluoride trays using COLD WATER and a toothbrush.    In order to keep your Trays from discoloring and free from odors, soak them overnight in denture cleaners such as Efferdent.  Do not use bleach or non denture products.    Store the trays in a safe dry place AWAY from any heat until your next treatment.    Bring the trays with you for your next dental check-up.  The dentist will confirm their fit.    If anything happens to your Fluoride trays, or they don't fit as well after any dental work, please let us know as soon as possible.

## 2012-11-01 ENCOUNTER — Inpatient Hospital Stay
Admission: RE | Admit: 2012-11-01 | Discharge: 2012-11-01 | Disposition: A | Discharge status: Home / Self Care | Payer: Self-pay | Source: Ambulatory Visit | Attending: Radiation Oncology | Admitting: Radiation Oncology

## 2012-11-01 ENCOUNTER — Telehealth: Payer: Self-pay | Admitting: Radiation Oncology

## 2012-11-01 ENCOUNTER — Ambulatory Visit
Admission: RE | Admit: 2012-11-01 | Discharge: 2012-11-01 | Disposition: A | Discharge status: Home / Self Care | Payer: Medicare Other | Source: Ambulatory Visit | Attending: Radiation Oncology | Admitting: Radiation Oncology

## 2012-11-01 DIAGNOSIS — Y842 Radiological procedure and radiotherapy as the cause of abnormal reaction of the patient, or of later complication, without mention of misadventure at the time of the procedure: Secondary | ICD-10-CM | POA: Insufficient documentation

## 2012-11-01 DIAGNOSIS — Z510 Encounter for antineoplastic radiation therapy: Secondary | ICD-10-CM | POA: Insufficient documentation

## 2012-11-01 DIAGNOSIS — Z931 Gastrostomy status: Secondary | ICD-10-CM | POA: Insufficient documentation

## 2012-11-01 DIAGNOSIS — Z79899 Other long term (current) drug therapy: Secondary | ICD-10-CM | POA: Insufficient documentation

## 2012-11-01 DIAGNOSIS — K1231 Oral mucositis (ulcerative) due to antineoplastic therapy: Secondary | ICD-10-CM | POA: Insufficient documentation

## 2012-11-01 DIAGNOSIS — B370 Candidal stomatitis: Secondary | ICD-10-CM | POA: Insufficient documentation

## 2012-11-01 DIAGNOSIS — H9190 Unspecified hearing loss, unspecified ear: Secondary | ICD-10-CM | POA: Insufficient documentation

## 2012-11-01 DIAGNOSIS — C099 Malignant neoplasm of tonsil, unspecified: Secondary | ICD-10-CM | POA: Insufficient documentation

## 2012-11-01 NOTE — Progress Notes (Signed)
Simulation, IMRT treatment planning, and Special treatment procedure note   OUTPATIENT  Diagnosis: head and neck cancer - RIGHT tonsil The patient was taken to the CT simulator and laid in the supine position on the table. An Aquaplast head and shoulder mask was custom fitted to his anatomy. High-resolution CT axial imaging was obtained of the head and neck with contrast. I verified that the quality of the imaging is good for treatment planning.   Treatment planning note I plan to treat the patient with helical Tomotherapy, IMRT. I plan to treat the patient's primary tumor and bilateral neck nodes. I plan to treat to a total dose of 70 Gray in 35 fractions   IMRT planning Note  IMRT is an important modality to deliver adequate dose to the patient's at risk tissues while sparing the patient's normal structures, including the: Parotid tissue, mandible, brain stem, spinal cord, oral cavity, brachial plexus .  This justifies the use of IMRT in the patient's treatment.   Special Treatment Procedure Note:  The patient will be receiving chemotherapy concurrently. Chemotherapy heightens the risk of side effects. I have considered this during the patient's treatment planning process and will monitor the patient accordingly for side effects on a weekly basis. Concurrent chemotherapy increases the complexity of this patient's treatment and therefore this constitutes a special treatment procedure.  -----------------------------------  Lonie Peak, MD

## 2012-11-01 NOTE — Telephone Encounter (Signed)
Met w patient to discuss RO billing. Pt had no financial concerns today.  Dx: Tonsil cancer - Primary 146.0   Attending Rad: SS   Rad Tx: IMRT

## 2012-11-01 NOTE — Progress Notes (Signed)
IV started in left outer arm, lateral to the antecubital space with a 22 gauge angiocath without any resistance.  Brisk blood retrun.  Flushed without any resistance or swelling .  Patient denied any pain.  Escorted to simulation at 12:12pm.  BUN and Creat from 09/28/12 WNL.  He is not a diabetic.

## 2012-11-02 ENCOUNTER — Encounter: Payer: Self-pay | Admitting: Oncology

## 2012-11-02 ENCOUNTER — Telehealth: Payer: Self-pay | Admitting: Oncology

## 2012-11-02 ENCOUNTER — Ambulatory Visit (HOSPITAL_BASED_OUTPATIENT_CLINIC_OR_DEPARTMENT_OTHER): Payer: Medicare Other | Admitting: Oncology

## 2012-11-02 ENCOUNTER — Ambulatory Visit (HOSPITAL_BASED_OUTPATIENT_CLINIC_OR_DEPARTMENT_OTHER): Payer: Medicare Other

## 2012-11-02 ENCOUNTER — Other Ambulatory Visit (HOSPITAL_BASED_OUTPATIENT_CLINIC_OR_DEPARTMENT_OTHER): Payer: Medicare Other | Admitting: Lab

## 2012-11-02 ENCOUNTER — Telehealth: Payer: Self-pay | Admitting: *Deleted

## 2012-11-02 VITALS — BP 128/76 | HR 69 | Temp 97.6°F | Resp 18 | Ht 69.0 in | Wt 194.2 lb

## 2012-11-02 DIAGNOSIS — B977 Papillomavirus as the cause of diseases classified elsewhere: Secondary | ICD-10-CM

## 2012-11-02 DIAGNOSIS — C099 Malignant neoplasm of tonsil, unspecified: Secondary | ICD-10-CM

## 2012-11-02 LAB — COMPREHENSIVE METABOLIC PANEL (CC13)
ALT: 48 U/L (ref 0–55)
AST: 28 U/L (ref 5–34)
Albumin: 3.9 g/dL (ref 3.5–5.0)
CO2: 28 mEq/L (ref 22–29)
Calcium: 9.5 mg/dL (ref 8.4–10.4)
Chloride: 99 mEq/L (ref 98–107)
Creatinine: 1.1 mg/dL (ref 0.7–1.3)
Potassium: 3.8 mEq/L (ref 3.5–5.1)
Total Protein: 7.4 g/dL (ref 6.4–8.3)

## 2012-11-02 LAB — CBC WITH DIFFERENTIAL/PLATELET
BASO%: 0.6 % (ref 0.0–2.0)
EOS%: 3 % (ref 0.0–7.0)
HCT: 44.3 % (ref 38.4–49.9)
HGB: 15.1 g/dL (ref 13.0–17.1)
MCHC: 34 g/dL (ref 32.0–36.0)
MONO#: 0.6 10*3/uL (ref 0.1–0.9)
NEUT%: 68.2 % (ref 39.0–75.0)
RDW: 13.8 % (ref 11.0–14.6)
WBC: 7.7 10*3/uL (ref 4.0–10.3)
lymph#: 1.6 10*3/uL (ref 0.9–3.3)

## 2012-11-02 MED ORDER — ONDANSETRON HCL 8 MG PO TABS: 8.0000 mg | ORAL_TABLET | Freq: Two times a day (BID) | ORAL | Status: DC | PRN

## 2012-11-02 MED ORDER — PROCHLORPERAZINE MALEATE 10 MG PO TABS: 10.0000 mg | ORAL_TABLET | Freq: Four times a day (QID) | ORAL | Status: DC | PRN

## 2012-11-02 MED ORDER — LORAZEPAM 0.5 MG PO TABS: 0.5000 mg | ORAL_TABLET | Freq: Four times a day (QID) | ORAL | Status: DC | PRN

## 2012-11-02 NOTE — Patient Instructions (Addendum)
1.  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  2.  Treatment options:  *  Upfront resection followed by adjuvant radiation +/- chemotherapy.  Without adjuvant therapy, there chance of recurrence can be up to 30-40%.   *  Upfront chemoradiation.  Surgery is then reserved for residual cancer.  Chance of response to upfront chemoradiation is about 80-90%.  Chance of cure with HPV positive cancer is about 70% at 5 years.  3.  Reason to add chemo to radiation:  Improve response, decrease recurrence, increase overall survival by about 5% as opposed to no chemo. 4.  Different types of chemo:  Cisplatin versus Cetuximab (aka Erbitux).  Side effects of cisplatin include but not limited to nausea vomiting, hair loss, fatigue, mouthsore, hearing impairment, kidney dysfunction, abnormal electrolytes, low blood count, risk of bleeding and infection.  Side effects of Erbitux include but not limited to skin rash, electrolytes abnormality, infusion reaction, diarrhea.  5.  There is no definitive data to support one over the other right now.  The caveat is that Cisplatin has been around the longest and by Limmie Schoenberg is considered to be standard of care.  Outside of clinical trial, I reserve Erbitux for patients who are in very poor shape to get chemo.  6.  There is a randomized clinical trial for patients with HPV positive oropharynx cancer.  Everybody receives radiation.  One group gets cisplatin; the other, Erbitux.

## 2012-11-02 NOTE — Telephone Encounter (Signed)
Per staff message and POF I have scheduled appts.  JMW  

## 2012-11-02 NOTE — Progress Notes (Signed)
[facility]  Telephone:([phone] [phone]   MEDICAL ONCOLOGY - INITIAL CONSULATION    Referral MD:  Omar Sawyer, M.D.  Reason for Referral: newly diagnosed cT2 N2b M0 right tonsil squamous cell carcinoma.    HPI:  [name] is a 72 year-old man with no history of smoking.  He developed about 6 weeks ago sinusitis.  He was referred to Dr. Suzanna Sawyer.  His symptoms improved with a course of empiric antibiotic. However, he was noted to have fungating right tonsil mass.  Upon repeat evaluation, the mass was still there.  On 10/03/2012, he had biopsy of the tonsil mass with path case # [id number] consistent with squamous cell carcinoma, p16 positive.  A neck CT on 10/26/2012 showed right tonsil mass (40mm) and right level IIA node at 17mm; and a level III node at 8mm (but 23mm long axis).  A PET scan on 10/26/2012 showed uptake at right tonsil mass with SUV 17; right level II A node SUV 11; right leve II node SUV 3.8. There was no evidence of metastatic disease.  He was kindly referred to the Cancer for evaluation.   Omar Sawyer presented to the clinic for the first time today with his wife.  He reported resolution of sinus draining, congestion, or pain.  He has mild discomfort in the right tonsil/base of tongue.  He does not notice the cervical nodes or any other location.  He still works full time a a Medical illustrator. Patient denies fever, anorexia, weight loss, fatigue, headache, visual changes, confusion, drenching night sweats, mucositis, odynophagia, dysphagia, nausea vomiting, jaundice, chest pain, palpitation, shortness of breath, dyspnea on exertion, productive cough, gum bleeding, epistaxis, hematemesis, hemoptysis, abdominal pain, abdominal swelling, early satiety, melena, hematochezia, hematuria, skin rash, spontaneous bleeding, joint swelling, joint pain, heat or cold intolerance, bowel bladder incontinence, back pain, focal motor weakness, paresthesia,  depression.      Past Medical History  Diagnosis Date  . Essential hypertension, benign   . Mixed hyperlipidemia   . Unspecified sinusitis (chronic)   . Edema   . Personal history of adenomatous colonic polyps 07/23/2012    2010  . Hearing loss   . Hx of prostatitis   . 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 Anxiety     mild new dx  . GERD (gastroesophageal reflux disease)   . Neuromuscular disorder     b/l tremors,   . Basal cell carcinoma   :  Past Surgical History  Procedure Laterality Date  . Colonoscopy  2011    polyps. Dr. Leone Sawyer.  Due now 2013  . Biopsy of right tonsil Right 10/03/2012    Squamous Cell Carcinoma     . Blepharoplasty    :  Current Outpatient Prescriptions  Medication Sig Dispense Refill  . amLODipine (NORVASC) 5 MG tablet Take 5 mg by mouth daily.       Marland Kitchen aspirin 81 MG tablet Take 81 mg by mouth daily.      . Cholecalciferol (VITAMIN D-3) 1000 UNITS CAPS Take 1 tablet by mouth daily.      . fish oil-omega-3 fatty acids 1000 MG capsule Take 2 g by mouth daily.      . hydrochlorothiazide (HYDRODIURIL) 25 MG tablet Take 25 mg by mouth  every other day.      . lisinopril (PRINIVIL,ZESTRIL) 20 MG tablet Take 20 mg by mouth daily.       . metoprolol succinate (TOPROL-XL) 50 MG 24 hr tablet Take 50 mg by mouth daily. Take 1 1/2 tablets by mouth daily.      Marland Kitchen omeprazole (PRILOSEC) 20 MG capsule Take 20 mg by mouth daily.       . potassium chloride (K-DUR) 10 MEQ tablet Take 1 tablet (10 mEq total) by mouth daily.  30 tablet  6  . sildenafil (VIAGRA) 100 MG tablet Take 100 mg by mouth daily as needed.      . simvastatin (ZOCOR) 20 MG tablet Take 1 tablet by mouth daily.      . sodium fluoride (FLUORISHIELD) 1.1 % GEL dental gel Brush and floss. Instill one drop of fluoride into fluoride tray.  Place over teeth for 5 minutes. Remove. Spit out excess. Repeat nightly  120 mL  prn  . Tamsulosin HCl (FLOMAX) 0.4 MG CAPS Take 0.4 mg by mouth daily.        No current facility-administered medications for this visit.     No Known Allergies:  Family History  Problem Relation Age of Onset  . Heart disease Mother   . Hypertension Mother   . Diabetes Mother   . Hyperlipidemia Mother   . Hypertension Father   . Heart disease Father   . Hypertension Brother   . Heart attack    . Cancer Sister     breast cancer  :  History   Social History  . Marital Status: Married    Spouse Name: N/A    Number of Children: 1  . Years of Education: N/A   Occupational History  .      working as a Copywriter, advertising; Lobbyist    Social History Main Topics  . Smoking status: Never Smoker   . Smokeless tobacco: Never Used  . Alcohol Use: No  . Drug Use: No  . Sexually Active: Not on file   Other Topics Concern  . Not on file   Social History Narrative  . No narrative on file  : Exam: ECOG 0.   General:  well-nourished in no acute distress.  Eyes:  no scleral icterus.  ENT:  There was a large, fungating, nonbleeding right tonsil mass. Neck was without thyromegaly.  Lymphatics:  Positive for right cervical level II node about 2cm.  I could not appreciate the right level III node.  There was no supraclavicular or axillary adenopathy.  Respiratory: lungs were clear bilaterally without wheezing or crackles.  Cardiovascular:  Regular rate and rhythm, S1/S2, without murmur, rub or gallop.  There was no pedal edema.  GI:  abdomen was soft, flat, nontender, nondistended, without organomegaly.  Muscoloskeletal:  no spinal tenderness of palpation of vertebral spine.  Skin exam was without echymosis, petichae.  Neuro exam was nonfocal.  Patient was able to get on and off exam table without assistance.  Gait was normal.  Patient was alerted and oriented.  Attention was good.   Language  was appropriate.  Mood was normal without depression.  Speech was not pressured.  Thought content was not tangential.     Lab Results  Component Value Date   WBC 7.7 11/02/2012   HGB 15.1 11/02/2012   HCT 44.3 11/02/2012   PLT 194 11/02/2012   GLUCOSE 106* 11/02/2012   CHOL 112 06/23/2012   TRIG 103.0 06/23/2012   HDL 33.60*  06/23/2012   LDLCALC 58 06/23/2012   ALT 48 11/02/2012   AST 28 11/02/2012   NA 138 11/02/2012   K 3.8 11/02/2012   CL 99 11/02/2012   CREATININE 1.1 11/02/2012   BUN 18.0 11/02/2012   CO2 28 11/02/2012     Assessment and Plan:    1.  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  2.  Treatment options:  *  Upfront resection followed by adjuvant radiation +/- chemotherapy.  Without adjuvant therapy, there chance of recurrence can be up to 30-40%.   *  Upfront chemoradiation.  Surgery is then reserved for residual cancer.  Chance of response to upfront chemoradiation is about 80-90%.  Chance of cure with HPV positive cancer is about 70% at 5 years.   Mr. Dallman would like to proceed with definitive concurrent chemoradiation.  There is no definitive data to support the use of induction chemo in patients without massive tumour burden and HPV positive.   3.  Reason to add chemo to radiation:  Improve response, decrease recurrence, increase overall survival by about 5% as opposed to no chemo. 4.  Different types of chemo:  Cisplatin versus Cetuximab (aka Erbitux).  Side effects of cisplatin include but not limited to nausea vomiting, hair loss, fatigue, mouthsore, hearing impairment, kidney dysfunction, abnormal electrolytes, low blood count, risk of bleeding and infection.  Side effects of Erbitux include but not limited to skin rash, electrolytes abnormality, infusion reaction, diarrhea.  5.  There is no definitive data to support one over the other right now.  The caveat is that Cisplatin has been around the longest and by default is considered to be  standard of care.  Outside of clinical trial, I reserve Erbitux for patients who are in very poor shape to get chemo.  6.  There is a randomized clinical trial RTOG 1016 for patients with HPV positive oropharynx cancer.  Mr. Dalziel was not interested in the trial since he did not want to be getting cetuximab every week.   In preparation for therapy, Dr. Basilio Cairo had referred patient to PEG tube, dental, Speech. He is due to start radiation on 11/13/2012.  I will see him that day to answer last minute question before starting therapy.  He was referred to chemo class.  I prescribed Ativan/Zofran/Compazine prn.   The length of time of the face-to-face encounter was 60 minutes. More than 50% of time was spent counseling and coordination of care.     Thank you for this referral.

## 2012-11-02 NOTE — Progress Notes (Signed)
Checked in new patient. No financial issues. °

## 2012-11-02 NOTE — Telephone Encounter (Signed)
Gave pt appt for chemo class then MD visit with chemo for MArch and April 2014

## 2012-11-03 ENCOUNTER — Ambulatory Visit (HOSPITAL_COMMUNITY): Admission: RE | Admit: 2012-11-03 | Payer: Medicare Other | Source: Ambulatory Visit

## 2012-11-03 ENCOUNTER — Encounter: Payer: Self-pay | Admitting: Nutrition

## 2012-11-05 ENCOUNTER — Other Ambulatory Visit: Payer: Self-pay | Admitting: Radiology

## 2012-11-06 ENCOUNTER — Ambulatory Visit: Payer: Medicare Other

## 2012-11-06 ENCOUNTER — Ambulatory Visit: Payer: Medicare Other | Admitting: Nutrition

## 2012-11-06 ENCOUNTER — Ambulatory Visit: Payer: Medicare Other | Attending: Radiation Oncology

## 2012-11-06 ENCOUNTER — Other Ambulatory Visit: Payer: Medicare Other

## 2012-11-06 DIAGNOSIS — R1311 Dysphagia, oral phase: Secondary | ICD-10-CM | POA: Insufficient documentation

## 2012-11-06 DIAGNOSIS — IMO0001 Reserved for inherently not codable concepts without codable children: Secondary | ICD-10-CM | POA: Insufficient documentation

## 2012-11-06 NOTE — Progress Notes (Signed)
This is a 72 year old male patient of Dr. Gaylyn Rong and Dr. Basilio Cairo diagnosed with tonsil cancer to receive concurrent chemoradiation therapy.  Past medical history includes hypertension, hyperlipidemia, edema, prostatitis, anxiety, and GERD.  Medications include vitamin D 3, omega-3 fatty acids, Ativan, Prilosec, Zofran, K-Dur, Compazine, and Zocor.  Labs include glucose of 106 on March 6.  Height: 69 inches. Weight: 194.2 pounds. Usual body weight 190 pounds in October 2013. BMI 28.67.  Estimated nutrition needs: 2200-2400 calories, 115-130 g protein, 2.4 L fluid.  Patient and wife present to nutrition assessment. Patient is receiving his PEG tomorrow. He has completed chemotherapy teaching class. He is interested in information on eating with tonsil cancer.  Nutrition diagnosis: Food and nutrition related knowledge deficit related to new diagnosis of tonsil cancer and associated treatments as evidenced by no prior need for nutrition related information.  Intervention: I've educated patient and his wife on strategies for consuming a high protein, high calorie diet to achieve weight maintenance throughout treatment. I've reviewed high-protein foods with patient and encouraged him to consume 6 meals or snacks daily. I have briefly educated him on strategies for eating with nausea and vomiting as well as constipation. I've encouraged increased fluid intake. I've encouraged him to take nausea medications as needed. I have briefly reviewed methods for feeding through his feeding tube. I provided fact sheets, oral nutrition supplement samples, and my contact information. I've answered his questions. Teach back method used.  Monitoring, evaluation, goals: Patient will tolerate adequate calories and protein to minimize weight loss. Patient will begin tube feedings once weight loss exceeds 5% of his usual body weight.  Next visit: Monday, March 17, during chemotherapy.

## 2012-11-07 ENCOUNTER — Ambulatory Visit (HOSPITAL_COMMUNITY)
Admission: RE | Admit: 2012-11-07 | Discharge: 2012-11-07 | Disposition: A | Discharge status: Home / Self Care | Payer: Medicare Other | Source: Ambulatory Visit | Attending: Radiation Oncology | Admitting: Radiation Oncology

## 2012-11-07 ENCOUNTER — Encounter (HOSPITAL_COMMUNITY): Payer: Self-pay

## 2012-11-07 DIAGNOSIS — Z79899 Other long term (current) drug therapy: Secondary | ICD-10-CM | POA: Insufficient documentation

## 2012-11-07 DIAGNOSIS — I10 Essential (primary) hypertension: Secondary | ICD-10-CM | POA: Insufficient documentation

## 2012-11-07 DIAGNOSIS — E782 Mixed hyperlipidemia: Secondary | ICD-10-CM | POA: Insufficient documentation

## 2012-11-07 DIAGNOSIS — K219 Gastro-esophageal reflux disease without esophagitis: Secondary | ICD-10-CM | POA: Insufficient documentation

## 2012-11-07 DIAGNOSIS — C099 Malignant neoplasm of tonsil, unspecified: Secondary | ICD-10-CM

## 2012-11-07 LAB — CBC
HCT: 44.9 % (ref 39.0–52.0)
MCHC: 33.9 g/dL (ref 30.0–36.0)
Platelets: 198 10*3/uL (ref 150–400)
RDW: 13.1 % (ref 11.5–15.5)
WBC: 6.8 10*3/uL (ref 4.0–10.5)

## 2012-11-07 LAB — APTT: aPTT: 32 seconds (ref 24–37)

## 2012-11-07 LAB — PROTIME-INR
INR: 1.09 (ref 0.00–1.49)
Prothrombin Time: 14 seconds (ref 11.6–15.2)

## 2012-11-07 MED ORDER — FENTANYL CITRATE 0.05 MG/ML IJ SOLN
INTRAMUSCULAR | Status: AC
  Filled 2012-11-07: qty 6

## 2012-11-07 MED ORDER — SODIUM CHLORIDE 0.9 % IV SOLN: INTRAVENOUS | Status: DC

## 2012-11-07 MED ORDER — MIDAZOLAM HCL 2 MG/2ML IJ SOLN
INTRAMUSCULAR | Status: AC
  Filled 2012-11-07: qty 6

## 2012-11-07 MED ORDER — FENTANYL CITRATE 0.05 MG/ML IJ SOLN
INTRAMUSCULAR | Status: AC | PRN
  Administered 2012-11-07: 100 ug via INTRAVENOUS

## 2012-11-07 MED ORDER — CEFAZOLIN SODIUM-DEXTROSE 2-3 GM-% IV SOLR
2.0000 g | INTRAVENOUS | Status: AC
  Administered 2012-11-07: 2 g via INTRAVENOUS
  Filled 2012-11-07: qty 50

## 2012-11-07 MED ORDER — IOHEXOL 300 MG/ML  SOLN
10.0000 mL | Freq: Once | INTRAMUSCULAR | Status: AC | PRN
  Administered 2012-11-07: 20 mL

## 2012-11-07 MED ORDER — GLUCAGON HCL (RDNA) 1 MG IJ SOLR
1.0000 mg | Freq: Once | INTRAMUSCULAR | Status: AC | PRN
  Filled 2012-11-07: qty 1

## 2012-11-07 MED ORDER — MIDAZOLAM HCL 2 MG/2ML IJ SOLN
INTRAMUSCULAR | Status: AC | PRN
  Administered 2012-11-07: 2 mg via INTRAVENOUS

## 2012-11-07 NOTE — H&P (Signed)
Chief Complaint: "I'm here for a feeding tube" Referring Physician:Ha HPI: Omar Sawyer is an 72 y.o. male with tonsillar cancer who is to receive chemotherapy and radiation therapy. He is at risk for dysphagia from the radiation and therefore is scheduled for perc gastrostomy tube in case he needs TF. He has drank his barium last night. He denies any other c/o and feels well this am. PMHx and meds reviewed.  Past Medical History:  Past Medical History  Diagnosis Date  . Essential hypertension, benign   . Mixed hyperlipidemia   . Unspecified sinusitis (chronic)   . Edema   . Personal history of adenomatous colonic polyps 07/23/2012    2010  . Hearing loss   . Hx of prostatitis   . 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 Anxiety     mild new dx  . GERD (gastroesophageal reflux disease)   . Neuromuscular disorder     b/l tremors,   . Basal cell carcinoma     Past Surgical History:  Past Surgical History  Procedure Laterality Date  . Colonoscopy  2011    polyps. Dr. Leone Payor.  Due now 2013  . Biopsy of right tonsil Right 10/03/2012    Squamous Cell Carcinoma     . Blepharoplasty      Family History:  Family History  Problem Relation Age of Onset  . Heart disease Mother   . Hypertension Mother   . Diabetes Mother   . Hyperlipidemia Mother   . Hypertension Father   . Heart disease Father   . Hypertension Brother   . Heart attack    . Cancer Sister     breast cancer    Social History:  reports that he has never smoked. He has never used smokeless tobacco. He reports that he does not drink alcohol or use illicit drugs.  Allergies: No Known Allergies  Medications: amLODipine (NORVASC) 5 MG tablet 03/28/2012 Sig - Route: Take 5 mg by mouth daily. - Oral Class: Historical Med Number of times this order has been  changed since signing: 3 Order [address] fish oil-omega-3 fatty acids 1000 MG capsule Sig - Route: Take 2 g by mouth daily. - Oral Class: Historical Med Number of times this order has been changed since signing: 1 Order [address] lisinopril (PRINIVIL,ZESTRIL) 20 MG tablet 04/23/2012 Sig - Route: Take 20 mg by mouth daily. - Oral Class: Historical Med Number of times this order has been changed since signing: 3 Order [address] metoprolol succinate (TOPROL-XL) 50 MG 24 hr tablet 04/11/2012 Sig - Route: Take 75 mg by mouth daily. - Oral Class: Historical Med Number of times this order has been changed since signing: 3 Order [address] omeprazole (PRILOSEC) 20 MG capsule 04/23/2012 Sig - Route: Take 20 mg by mouth daily. - Oral Class: Historical Med Number of times this order has been changed since signing: 3 Order [address] potassium chloride (K-DUR) 10 MEQ tablet 30 tablet 6 06/23/2012 Sig - Route: Take 1 tablet (10 mEq total) by mouth daily. - Oral Number of times this order has been changed since signing: 1 Order [address] sildenafil (VIAGRA) 100 MG tablet Sig - Route: Take 100 mg by mouth daily as needed. - Oral Class: Historical Med Number of times this order  has been changed since signing: 1 Order [address] simvastatin (ZOCOR) 20 MG tablet 05/20/2012 Sig - Route: Take 1 tablet by mouth daily. - Oral Class: Historical Med Number of times this order has been changed since signing: 1 Order [address] sodium fluoride (FLUORISHIELD) 1.1 % GEL dental gel 120 mL prn 10/31/2012 10/30/2013 Sig: Brush and floss. Instill one drop of fluoride into fluoride tray. Place over teeth for 5 minutes. Remove. Spit out excess. Repeat nightly Class: Print Number of times this order has been changed since signing: 1 Order [address] Tamsulosin HCl (FLOMAX) 0.4 MG CAPS 05/01/2012 Sig - Route: Take 0.4 mg by mouth daily. - Oral Class: Historical Med   Please HPI for pertinent positives, otherwise complete 10 system ROS  negative.  Physical Exam: Blood pressure 135/73, pulse 66, temperature 97.9 F (36.6 C), temperature source Oral, resp. rate 20, height 5\' 9"  (1.753 m), weight 194 lb (87.998 kg), SpO2 100.00%. Body mass index is 28.64 kg/(m^2).   General Appearance:  Alert, cooperative, no distress, appears stated age  Head:  Normocephalic, without obvious abnormality, atraumatic  ENT: Unremarkable  Neck: Supple, symmetrical, trachea midline, no adenopathy, thyroid: not enlarged, symmetric, no tenderness/mass/nodules  Lungs:   Clear to auscultation bilaterally, no w/r/r, respirations unlabored without use of accessory muscles.  Heart:  Regular rate and rhythm, S1, S2 normal, no murmur, rub or gallop. Carotids 2+ without bruit.  Abdomen:   Soft, non-tender, non distended. No scars.  Neurologic: Normal affect, no gross deficits.   Results for orders placed during the hospital encounter of 11/07/12 (from the past 48 hour(s))  CBC     Status: None   Collection Time    11/07/12  7:45 AM      Result Value Range   WBC 6.8  4.0 - 10.5 K/uL   RBC 5.00  4.22 - 5.81 MIL/uL   Hemoglobin 15.2  13.0 - 17.0 g/dL   HCT 16.1  09.6 - 04.5 %   MCV 89.8  78.0 - 100.0 fL   MCH 30.4  26.0 - 34.0 pg   MCHC 33.9  30.0 - 36.0 g/dL   RDW 40.9  81.1 - 91.4 %   Platelets 198  150 - 400 K/uL  PROTIME-INR     Status: None   Collection Time    11/07/12  8:30 AM      Result Value Range   Prothrombin Time 14.0  11.6 - 15.2 seconds   INR 1.09  0.00 - 1.49  APTT     Status: None   Collection Time    11/07/12  8:30 AM      Result Value Range   aPTT 32  24 - 37 seconds   No results found.  Assessment/Plan Tonsillar cancer Discussed G-tube placement, risks, complications, and eventual removal. Labs reviewed. Consent signed in chart  Brayton El PA-C 11/07/2012, 9:03 AM

## 2012-11-07 NOTE — Procedures (Signed)
Successful fluoroscopic guided insertion of gastrostomy tube without immediate post procedural complicatoin.   The gastrostomy tube may be used immediately for medications.  Tube feeds may be initiated in 24 hours as per the primary team.   

## 2012-11-08 ENCOUNTER — Other Ambulatory Visit: Payer: Self-pay | Admitting: Radiation Oncology

## 2012-11-08 DIAGNOSIS — C099 Malignant neoplasm of tonsil, unspecified: Secondary | ICD-10-CM

## 2012-11-10 ENCOUNTER — Encounter (HOSPITAL_COMMUNITY): Payer: Self-pay | Admitting: Pharmacy Technician

## 2012-11-13 ENCOUNTER — Ambulatory Visit
Admission: RE | Admit: 2012-11-13 | Discharge: 2012-11-13 | Disposition: A | Discharge status: Home / Self Care | Payer: Medicare Other | Source: Ambulatory Visit | Attending: Radiation Oncology | Admitting: Radiation Oncology

## 2012-11-13 ENCOUNTER — Encounter: Payer: Self-pay | Admitting: Radiation Oncology

## 2012-11-13 ENCOUNTER — Ambulatory Visit (HOSPITAL_BASED_OUTPATIENT_CLINIC_OR_DEPARTMENT_OTHER): Payer: Medicare Other | Admitting: Oncology

## 2012-11-13 ENCOUNTER — Telehealth: Payer: Self-pay | Admitting: Oncology

## 2012-11-13 ENCOUNTER — Ambulatory Visit (HOSPITAL_COMMUNITY)
Admission: RE | Admit: 2012-11-13 | Discharge: 2012-11-13 | Disposition: A | Discharge status: Home / Self Care | Payer: Medicare Other | Source: Ambulatory Visit | Attending: Oncology | Admitting: Oncology

## 2012-11-13 ENCOUNTER — Ambulatory Visit (HOSPITAL_BASED_OUTPATIENT_CLINIC_OR_DEPARTMENT_OTHER): Payer: Medicare Other

## 2012-11-13 ENCOUNTER — Ambulatory Visit: Payer: Medicare Other | Admitting: Nutrition

## 2012-11-13 VITALS — BP 142/75 | HR 69 | Temp 97.3°F | Resp 18 | Ht 69.0 in | Wt 197.2 lb

## 2012-11-13 VITALS — BP 135/76 | HR 66 | Temp 97.6°F | Wt 194.4 lb

## 2012-11-13 DIAGNOSIS — C099 Malignant neoplasm of tonsil, unspecified: Secondary | ICD-10-CM

## 2012-11-13 DIAGNOSIS — Z431 Encounter for attention to gastrostomy: Secondary | ICD-10-CM | POA: Insufficient documentation

## 2012-11-13 DIAGNOSIS — Z5111 Encounter for antineoplastic chemotherapy: Secondary | ICD-10-CM

## 2012-11-13 MED ORDER — POTASSIUM CHLORIDE 2 MEQ/ML IV SOLN
Freq: Once | INTRAVENOUS | Status: AC
  Administered 2012-11-13: 10:00:00 via INTRAVENOUS
  Filled 2012-11-13: qty 10

## 2012-11-13 MED ORDER — IOHEXOL 300 MG/ML  SOLN
10.0000 mL | Freq: Once | INTRAMUSCULAR | Status: AC | PRN
  Administered 2012-11-13: 10 mL

## 2012-11-13 MED ORDER — DEXAMETHASONE SODIUM PHOSPHATE 4 MG/ML IJ SOLN
12.0000 mg | Freq: Once | INTRAMUSCULAR | Status: AC
  Administered 2012-11-13: 12 mg via INTRAVENOUS

## 2012-11-13 MED ORDER — SODIUM CHLORIDE 0.9 % IV SOLN
150.0000 mg | Freq: Once | INTRAVENOUS | Status: AC
  Administered 2012-11-13: 150 mg via INTRAVENOUS
  Filled 2012-11-13: qty 5

## 2012-11-13 MED ORDER — PALONOSETRON HCL INJECTION 0.25 MG/5ML
0.2500 mg | Freq: Once | INTRAVENOUS | Status: AC
  Administered 2012-11-13: 0.25 mg via INTRAVENOUS

## 2012-11-13 MED ORDER — CISPLATIN CHEMO INJECTION 100MG/100ML
200.0000 mg | Freq: Once | INTRAVENOUS | Status: AC
  Administered 2012-11-13: 200 mg via INTRAVENOUS
  Filled 2012-11-13: qty 200

## 2012-11-13 NOTE — Progress Notes (Signed)
Omar Sawyer has received his first fraction to his right tonsil today.  He will receive chemotherapy today in med/onc infusion and currently has 20 mcg of potassium infusing.  No voiced concerns today.  Peg tube in place and he is flushing daily with water.  Given Radiatiion therapy and You booklet with pages turned down based on potential side-effect profile.  Will review with him on tomorrow.

## 2012-11-13 NOTE — Procedures (Signed)
Appropriately positioned and functioning gastrostomy tube.  No exchange performed.  Gastrostomy tube is ready for immediate use.

## 2012-11-13 NOTE — Patient Instructions (Addendum)
1.  Head and neck cancer. 2.  Treatment:  Concurrent chemo radiation with every 3 weeks Cisplatin and daily radiation. 3.  Mouth sore prevention:  Salt/baking soda mouth rinses at least 3x/day between meals in addition to Biotene  4.  To prevent esophageal stricture:  Please perform swallowing exercises several times a day. 5.  Nausea/vomiting medications:  If you experience nausea and vomiting with chemotherapy, please take your prescribed medications.  These medications should NOT be taken at the same times.  Take one by itself, and if the first one does not work in about 2-3 hours, take a different medications.  In other words, they should be rotated.   1.  Prochloroperzaine (Brand name:  Compazine):  10mg  by mouth, every 6 hours as needed.  2.  Odansetron (Brand name:  Zofran):  4-8 mg by mouth or under the tongue, every 8-12 hours as needed.  3.   Lorazepam (Brand name:  Ativan):  0.5mg  by mouth or under the tongue, every 6 hours; especially before meal for anticipatory nausea and vomiting.   There are a few other "home remedies" which you may consider trying:  1.  Slide of fresh gingers under the tongue; or ginger candy, or ginger ale.   2.  Sea Band:  Some patients swear by this.  It is a wrist band that is claimed to have some acupressure property.      Follow up:  In about 10 days to ensure you're doing well.

## 2012-11-13 NOTE — Progress Notes (Signed)
@  0920 pt to interventional rad for peg tube (per dr ha tube is too tight). Ambulatory with hydration fluids accompanied by tina from [facility].  dmr

## 2012-11-13 NOTE — Addendum Note (Signed)
Encounter addended by: Lonie Peak, MD on: 11/13/2012  5:37 PM<BR>     Documentation filed: Notes Section

## 2012-11-13 NOTE — Progress Notes (Signed)
I spoke briefly with patient's wife in the chemotherapy area. Patient was receiving his radiation treatment at that time. Today's his first chemotherapy treatment. Patient's wife reports patient has been flushing his feeding tube with water and taking care of it as directed. His weight is stable 194 pounds. He has no nutrition issues at this time.  Nutrition diagnosis: Food and nutrition related knowledge deficit improved.  Intervention: I've educated patient's wife to continue high-calorie, high-protein foods in small amounts throughout the day to promote weight maintenance. I will continue to work with patient and his wife during treatment.  Monitoring, evaluation, goals: Patient will tolerate adequate calories and protein to minimize weight loss. Patient will begin tube feedings once weight loss exceeds 5% of his usual body weight.  Next visit: I will followup with patient on a regular basis throughout treatment. He has my contact information for questions or concerns.

## 2012-11-13 NOTE — Progress Notes (Signed)
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   PAST THERAPY:  Biopsy only.   CURRENT THERAPY:  Due to start concurrent chemo with q3 weeks Cisplatin and daily radiation today 11/13/2012.   INTERVAL HISTORY: Omar Sawyer 72 y.o. male returns for regular follow up to start therapy.  He reports feeling a catch in the right side of his throat.  He needs to clear his throat throughout the day.  He had PEG tube placement with discomfort in the area.  He denied fever, SOB, chest pain, erythema, purulent discharge from PEG tube.  The rest of the 14-point review of system was negative.   Past Medical History  Diagnosis Date  . Essential hypertension, benign   . Mixed hyperlipidemia   . Unspecified sinusitis (chronic)   . Edema   . Personal history of adenomatous colonic polyps 07/23/2012    2010  . Hearing loss   . Hx of prostatitis   . 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 Anxiety     mild new dx  . GERD (gastroesophageal reflux disease)   . Neuromuscular disorder     b/l tremors,   . Basal cell carcinoma     Past Surgical History  Procedure Laterality Date  . Colonoscopy  2011    polyps. Dr. Leone Payor.  Due now 2013  . Biopsy of right tonsil Right 10/03/2012    Squamous Cell Carcinoma     . Blepharoplasty      Current Outpatient Prescriptions  Medication Sig Dispense Refill  . amLODipine (NORVASC) 5 MG tablet Take 5 mg by mouth daily before breakfast.       . aspirin EC 81 MG tablet Take 81 mg by mouth every evening.       . cholecalciferol (VITAMIN D) 1000 UNITS tablet Take 1,000 Units by mouth daily.      . fish oil-omega-3 fatty acids 1000 MG capsule Take 1 g by mouth  daily.       . hydrochlorothiazide (HYDRODIURIL) 25 MG tablet Take 25 mg by mouth every other day.       . lisinopril (PRINIVIL,ZESTRIL) 20 MG tablet Take 20 mg by mouth every evening.       . metoprolol succinate (TOPROL-XL) 50 MG 24 hr tablet Take 75 mg by mouth daily before breakfast.       . omeprazole (PRILOSEC) 20 MG capsule Take 20 mg by mouth daily.       . potassium chloride (K-DUR) 10 MEQ tablet Take 1 tablet (10 mEq total) by mouth daily.  30 tablet  6  . simvastatin (ZOCOR) 20 MG tablet Take 1 tablet by mouth at bedtime.       . sodium fluoride (FLUORISHIELD) 1.1 % GEL dental gel Brush and floss. Instill one drop of fluoride into fluoride tray. Place over teeth for 5 minutes. Remove. Spit out excess. Repeat nightly  120 mL  prn  . Tamsulosin HCl (FLOMAX) 0.4 MG CAPS Take 0.4 mg by mouth daily.       Marland Kitchen LORazepam (ATIVAN) 0.5 MG tablet Take 1 tablet (0.5 mg total) by mouth every 6 (six) hours as needed (anticipation nausea  or vomiting).  30 tablet  1  . ondansetron (ZOFRAN) 8 MG tablet Take 1 tablet (8 mg total) by mouth every 12 (twelve) hours as needed for nausea. Take two times a day as needed for nausea or vomiting starting on the third day after chemotherapy.  30 tablet  1  . prochlorperazine (COMPAZINE) 10 MG tablet Take 1 tablet (10 mg total) by mouth every 6 (six) hours as needed (Nausea or vomiting).  30 tablet  1   No current facility-administered medications for this visit.   Facility-Administered Medications Ordered in Other Visits  Medication Dose Route Frequency Provider Last Rate Last Dose  . CISplatin (PLATINOL) 200 mg in sodium chloride 0.9 % 500 mL chemo infusion  200 mg Intravenous Once Exie Parody, MD      . fosaprepitant (EMEND) 150 mg in sodium chloride 0.9 % 145 mL IVPB  150 mg Intravenous Once Exie Parody, MD 300 mL/hr at 11/13/12 1315 150 mg at 11/13/12 1315    ALLERGIES:  has No Known Allergies.   Filed Vitals:   11/13/12 0825  BP: 142/75  Pulse: 69  Temp:  97.3 F (36.3 C)  Resp: 18   Wt Readings from Last 3 Encounters:  11/13/12 194 lb 6.4 oz (88.179 kg)  11/13/12 197 lb 3.2 oz (89.449 kg)  11/07/12 194 lb (87.998 kg)   ECOG Performance status: 0  PHYSICAL EXAMINATION:   General:  well-nourished man, in no acute distress.  Eyes:  no scleral icterus.  ENT:  There were no oropharyngeal lesions.  Neck was without thyromegaly.  Lymphatics:  Negative cervical, supraclavicular or axillary adenopathy.  Respiratory: lungs were clear bilaterally without wheezing or crackles.  Cardiovascular:  Regular rate and rhythm, S1/S2, without murmur, rub or gallop.  There was no pedal edema.  GI:  abdomen was soft, flat, nontender, nondistended, without organomegaly. PEG tube showed mild erythema at the stopper site due to tight fitting but not purulent discharge.  Muscoloskeletal:  no spinal tenderness of palpation of vertebral spine.  Skin exam was without echymosis, petichae.  Neuro exam was nonfocal.  Patient was able to get on and off exam table without assistance.  Gait was normal.  Patient was alerted and oriented.  Attention was good.   Language was appropriate.  Mood was normal without depression.  Speech was not pressured.  Thought content was not tangential.      LABORATORY/RADIOLOGY DATA:  Lab Results  Component Value Date   WBC 6.8 11/07/2012   HGB 15.2 11/07/2012   HCT 44.9 11/07/2012   PLT 198 11/07/2012   GLUCOSE 106* 11/02/2012   CHOL 112 06/23/2012   TRIG 103.0 06/23/2012   HDL 33.60* 06/23/2012   LDLCALC 58 06/23/2012   ALKPHOS 66 11/02/2012   ALT 48 11/02/2012   AST 28 11/02/2012   NA 138 11/02/2012   K 3.8 11/02/2012   CL 99 11/02/2012   CREATININE 1.1 11/02/2012   BUN 18.0 11/02/2012   CO2 28 11/02/2012   INR 1.09 11/07/2012    ASSESSMENT AND PLAN:   1.  Diagnosis:  Head and neck cancer. 2.  Treatment:  Concurrent chemo radiation with every 3 weeks Cisplatin and daily radiation.  I discussed with patient and his wife potential side effects of  Cisplatin which include but not limited to nausea vomiting, alopecia, fatigue, mucositis, ototoxicity, nephrotoxocity, abnormal electrolytes, cytopenia, risk of bleeding and infection.  Omar Sawyer and his wife expressed informed understanding and wished to start chemo today.  3.  Mouth sore prevention:  Salt/baking soda mouth rinses at least 3x/day between meals in addition to Biotene  4.  To prevent esophageal stricture:  Please perform swallowing exercises several times a day. 5.  PEG tube tightness:  I referred him back to IR for loosening of his PEG tube if appropriate.  6.  Nausea/vomiting medications:  - Prochloroperzaine (Brand name:  Compazine):  10mg  by mouth, every 6 hours as needed.  - Odansetron (Brand name:  Zofran):  4-8 mg by mouth or under the tongue, every 8-12 hours as needed.  -  Lorazepam (Brand name:  Ativan):  0.5mg  by mouth or under the tongue, every 6 hours; especially before meal for anticipatory nausea and vomiting.   Follow up:  In about 10 days to ensure he is doing well.       The length of time of the face-to-face encounter was 25 minutes. More than 50% of time was spent counseling and coordination of care.

## 2012-11-13 NOTE — Patient Instructions (Addendum)
Cisplatin injection What is this medicine? CISPLATIN (SIS pla tin) is a chemotherapy drug. It targets fast dividing cells, like cancer cells, and causes these cells to die. This medicine is used to treat many types of cancer like bladder, ovarian, and testicular cancers. This medicine may be used for other purposes; ask your health care provider or pharmacist if you have questions. What should I tell my health care provider before I take this medicine? They need to know if you have any of these conditions: -blood disorders -hearing problems -kidney disease -recent or ongoing radiation therapy -an unusual or allergic reaction to cisplatin, carboplatin, other chemotherapy, other medicines, foods, dyes, or preservatives -pregnant or trying to get pregnant -breast-feeding How should I use this medicine? This drug is given as an infusion into a vein. It is administered in a hospital or clinic by a specially trained health care professional. Talk to your pediatrician regarding the use of this medicine in children. Special care may be needed. Overdosage: If you think you have taken too much of this medicine contact a poison control center or emergency room at once. NOTE: This medicine is only for you. Do not share this medicine with others. What if I miss a dose? It is important not to miss a dose. Call your doctor or health care professional if you are unable to keep an appointment. What may interact with this medicine? -dofetilide -foscarnet -medicines for seizures -medicines to increase blood counts like filgrastim, pegfilgrastim, sargramostim -probenecid -pyridoxine used with altretamine -rituximab -some antibiotics like amikacin, gentamicin, neomycin, polymyxin B, streptomycin, tobramycin -sulfinpyrazone -vaccines -zalcitabine Talk to your doctor or health care professional before taking any of these medicines: -acetaminophen -aspirin -ibuprofen -ketoprofen -naproxen This list may  not describe all possible interactions. Give your health care provider a list of all the medicines, herbs, non-prescription drugs, or dietary supplements you use. Also tell them if you smoke, drink alcohol, or use illegal drugs. Some items may interact with your medicine. What should I watch for while using this medicine? Your condition will be monitored carefully while you are receiving this medicine. You will need important blood work done while you are taking this medicine. This drug may make you feel generally unwell. This is not uncommon, as chemotherapy can affect healthy cells as well as cancer cells. Report any side effects. Continue your course of treatment even though you feel ill unless your doctor tells you to stop. In some cases, you may be given additional medicines to help with side effects. Follow all directions for their use. Call your doctor or health care professional for advice if you get a fever, chills or sore throat, or other symptoms of a cold or flu. Do not treat yourself. This drug decreases your body's ability to fight infections. Try to avoid being around people who are sick. This medicine may increase your risk to bruise or bleed. Call your doctor or health care professional if you notice any unusual bleeding. Be careful brushing and flossing your teeth or using a toothpick because you may get an infection or bleed more easily. If you have any dental work done, tell your dentist you are receiving this medicine. Avoid taking products that contain aspirin, acetaminophen, ibuprofen, naproxen, or ketoprofen unless instructed by your doctor. These medicines may hide a fever. Do not become pregnant while taking this medicine. Women should inform their doctor if they wish to become pregnant or think they might be pregnant. There is a potential for serious side effects to   an unborn child. Talk to your health care professional or pharmacist for more information. Do not breast-feed an  infant while taking this medicine. Drink fluids as directed while you are taking this medicine. This will help protect your kidneys. Call your doctor or health care professional if you get diarrhea. Do not treat yourself. What side effects may I notice from receiving this medicine? Side effects that you should report to your doctor or health care professional as soon as possible: -allergic reactions like skin rash, itching or hives, swelling of the face, lips, or tongue -signs of infection - fever or chills, cough, sore throat, pain or difficulty passing urine -signs of decreased platelets or bleeding - bruising, pinpoint red spots on the skin, black, tarry stools, nosebleeds -signs of decreased red blood cells - unusually weak or tired, fainting spells, lightheadedness -breathing problems -changes in hearing -gout pain -low blood counts - This drug may decrease the number of white blood cells, red blood cells and platelets. You may be at increased risk for infections and bleeding. -nausea and vomiting -pain, swelling, redness or irritation at the injection site -pain, tingling, numbness in the hands or feet -problems with balance, movement -trouble passing urine or change in the amount of urine Side effects that usually do not require medical attention (report to your doctor or health care professional if they continue or are bothersome): -changes in vision -loss of appetite -metallic taste in the mouth or changes in taste This list may not describe all possible side effects. Call your doctor for medical advice about side effects. You may report side effects to FDA at 1-800-FDA-1088. Where should I keep my medicine? This drug is given in a hospital or clinic and will not be stored at home. NOTE: This sheet is a summary. It may not cover all possible information. If you have questions about this medicine, talk to your doctor, pharmacist, or health care provider.  2012, [employer]  Standard. (11/21/2007 2:40:54 PM) 

## 2012-11-13 NOTE — Progress Notes (Signed)
Weekly Management Note:  outpatient Current Dose:  2 Gy  Projected Dose: 70 Gy   Narrative:  The patient presents for routine under treatment assessment.  CBCT/MVCT images/Port film x-rays were reviewed.  The chart was checked. No complaints thus far. Receiving chemotherapy per IV. S/p PEG tube placement.  Physical Findings:  Vitals with Age-Percentiles 11/13/2012  Length 175.3 cm  Systolic 142  Diastolic 75  Pulse 69  Respiration 18  Weight 89.449 kg  VISIT REPORT    Well - appearing.  CBC    Component Value Date/Time   WBC 6.8 11/07/2012 0745   WBC 7.7 11/02/2012 1039   RBC 5.00 11/07/2012 0745   RBC 4.86 11/02/2012 1039   HGB 15.2 11/07/2012 0745   HGB 15.1 11/02/2012 1039   HCT 44.9 11/07/2012 0745   HCT 44.3 11/02/2012 1039   PLT 198 11/07/2012 0745   PLT 194 11/02/2012 1039   MCV 89.8 11/07/2012 0745   MCV 91.2 11/02/2012 1039   MCH 30.4 11/07/2012 0745   MCH 31.0 11/02/2012 1039   MCHC 33.9 11/07/2012 0745   MCHC 34.0 11/02/2012 1039   RDW 13.1 11/07/2012 0745   RDW 13.8 11/02/2012 1039   LYMPHSABS 1.6 11/02/2012 1039   LYMPHSABS 1.0 05/01/2008 1947   MONOABS 0.6 11/02/2012 1039   MONOABS 0.7 05/01/2008 1947   EOSABS 0.2 11/02/2012 1039   EOSABS 0.1 05/01/2008 1947   BASOSABS 0.0 11/02/2012 1039   BASOSABS 0.1 05/01/2008 1947    CMP     Component Value Date/Time   NA 138 11/02/2012 1039   NA 132* 09/28/2012 0916   K 3.8 11/02/2012 1039   K 3.7 09/28/2012 0916   CL 99 11/02/2012 1039   CL 91* 09/28/2012 0916   CO2 28 11/02/2012 1039   CO2 30 09/28/2012 0916   GLUCOSE 106* 11/02/2012 1039   GLUCOSE 116* 09/28/2012 0916   BUN 18.0 11/02/2012 1039   BUN 18 09/28/2012 0916   CREATININE 1.1 11/02/2012 1039   CREATININE 1.2 09/28/2012 0916   CALCIUM 9.5 11/02/2012 1039   CALCIUM 9.2 09/28/2012 0916   PROT 7.4 11/02/2012 1039   PROT 7.5 06/23/2012 1037   ALBUMIN 3.9 11/02/2012 1039   ALBUMIN 3.9 06/23/2012 1037   AST 28 11/02/2012 1039   AST 24 06/23/2012 1037   ALT 48 11/02/2012 1039   ALT 35 06/23/2012 1037   ALKPHOS 66 11/02/2012 1039   ALKPHOS 53 06/23/2012 1037   BILITOT 0.71 11/02/2012 1039   BILITOT 0.4 06/23/2012 1037   GFRNONAA >60 05/01/2008 1947   GFRAA  Value: >60        The eGFR has been calculated using the MDRD equation. This calculation has not been validated in all clinical 05/01/2008 1947     Impression:  The patient is tolerating radiotherapy.  Plan:  Continue radiotherapy as planned.  ________________________________   Lonie Peak, M.D.

## 2012-11-13 NOTE — Progress Notes (Addendum)
   IMRT Device Note - outpatient  10.2  delivered field widths represent one set of IMRT treatment devices. The code is [id number].   -----------------------------------  Lonie Peak, MD

## 2012-11-14 ENCOUNTER — Ambulatory Visit
Admission: RE | Admit: 2012-11-14 | Discharge: 2012-11-14 | Disposition: A | Discharge status: Home / Self Care | Payer: Medicare Other | Source: Ambulatory Visit | Attending: Radiation Oncology | Admitting: Radiation Oncology

## 2012-11-15 ENCOUNTER — Ambulatory Visit: Admission: RE | Admit: 2012-11-15 | Payer: Medicare Other | Source: Ambulatory Visit

## 2012-11-15 ENCOUNTER — Ambulatory Visit
Admission: RE | Admit: 2012-11-15 | Discharge: 2012-11-15 | Disposition: A | Discharge status: Home / Self Care | Payer: Medicare Other | Source: Ambulatory Visit | Attending: Radiation Oncology | Admitting: Radiation Oncology

## 2012-11-15 DIAGNOSIS — C099 Malignant neoplasm of tonsil, unspecified: Secondary | ICD-10-CM

## 2012-11-15 MED ORDER — RADIAPLEXRX EX GEL
Freq: Once | CUTANEOUS | Status: AC
  Administered 2012-11-15: 1 via TOPICAL

## 2012-11-15 NOTE — Progress Notes (Signed)
Education completed today regarding management of side effects related to receiving radiation therapy to the head and neck region for cancer of the right tonsillar region.  Reviewed management of skin care, dysphagia, odonyphagia, pain, fatigue, dry mouth, oral care with biotene mouthwash, tooth paste, and gel to moisten mouth.  Given Radiation Therapy and You booklet prior to this visit to reviewed marked pages regarding above information.  Also stressed importance of performing jaw and moth exercises as instructed by Speech pathologist to prevent trismus of his jaw.  Noted yellow drainage from PEG tube today.  Obtained a culture as ordered by Dr. Basilio Cairo and sent to the lab.  Area slightly reddened at PEG site.  Area cleansed with sterile NS, drain sponge applied for comfort and tube secured with paper tape to prevent tugging.

## 2012-11-16 ENCOUNTER — Ambulatory Visit
Admission: RE | Admit: 2012-11-16 | Discharge: 2012-11-16 | Disposition: A | Discharge status: Home / Self Care | Payer: Medicare Other | Source: Ambulatory Visit | Attending: Radiation Oncology | Admitting: Radiation Oncology

## 2012-11-17 ENCOUNTER — Ambulatory Visit
Admission: RE | Admit: 2012-11-17 | Discharge: 2012-11-17 | Disposition: A | Discharge status: Home / Self Care | Payer: Medicare Other | Source: Ambulatory Visit | Attending: Radiation Oncology | Admitting: Radiation Oncology

## 2012-11-20 ENCOUNTER — Ambulatory Visit
Admission: RE | Admit: 2012-11-20 | Discharge: 2012-11-20 | Disposition: A | Discharge status: Home / Self Care | Payer: Medicare Other | Source: Ambulatory Visit | Attending: Radiation Oncology | Admitting: Radiation Oncology

## 2012-11-20 VITALS — BP 110/63 | HR 68 | Temp 97.4°F | Wt 187.2 lb

## 2012-11-20 DIAGNOSIS — C099 Malignant neoplasm of tonsil, unspecified: Secondary | ICD-10-CM

## 2012-11-20 LAB — WOUND CULTURE

## 2012-11-20 MED ORDER — CEFUROXIME AXETIL 500 MG PO TABS: 500.0000 mg | ORAL_TABLET | Freq: Two times a day (BID) | ORAL | Status: DC

## 2012-11-20 MED ORDER — FLUCONAZOLE 100 MG PO TABS: ORAL_TABLET | ORAL | Status: DC

## 2012-11-20 NOTE — Progress Notes (Signed)
Omar Sawyer has received 6 fractions to neck.  Received chemotherapy on 11/13/12.  He c/o of a  Level 1 sore throat presently.  He is very fatigued and sleeping frequently.    Has gone to bed at 6 pm for the past 2 days.   He has lost 5 lbs since 11/13/12.  Continues to have green drainage with odor around PEG site.

## 2012-11-20 NOTE — Progress Notes (Signed)
Weekly Management Note: Outpatient Current Dose:  12Gy  Projected Dose: 70 Gy   Narrative:  The patient presents for routine under treatment assessment.  CBCT/MVCT images/Port film x-rays were reviewed.  The chart was checked. Omar Sawyer has received 6 fractions to neck. Received chemotherapy on 11/13/12. He c/o of a Level 1 sore throat presently. He is very fatigued and sleeping frequently. Has gone to bed at 6 pm for the past 2 days. He has lost 5 lbs since 11/13/12. Continues to have yellow drainage with odor around PEG site.  Lost hearing to significant degree soon after cycle 1 CDDP   Physical Findings:  weight is 187 lb 3.2 oz (84.913 kg). His temperature is 97.4 F (36.3 C). His blood pressure is 110/63 and his pulse is 68.  White film, probably thrush over tongue.  No obvious tumor response yet in right tonsil. CBC    Component Value Date/Time   WBC 6.8 11/07/2012 0745   WBC 7.7 11/02/2012 1039   RBC 5.00 11/07/2012 0745   RBC 4.86 11/02/2012 1039   HGB 15.2 11/07/2012 0745   HGB 15.1 11/02/2012 1039   HCT 44.9 11/07/2012 0745   HCT 44.3 11/02/2012 1039   PLT 198 11/07/2012 0745   PLT 194 11/02/2012 1039   MCV 89.8 11/07/2012 0745   MCV 91.2 11/02/2012 1039   MCH 30.4 11/07/2012 0745   MCH 31.0 11/02/2012 1039   MCHC 33.9 11/07/2012 0745   MCHC 34.0 11/02/2012 1039   RDW 13.1 11/07/2012 0745   RDW 13.8 11/02/2012 1039   LYMPHSABS 1.6 11/02/2012 1039   LYMPHSABS 1.0 05/01/2008 1947   MONOABS 0.6 11/02/2012 1039   MONOABS 0.7 05/01/2008 1947   EOSABS 0.2 11/02/2012 1039   EOSABS 0.1 05/01/2008 1947   BASOSABS 0.0 11/02/2012 1039   BASOSABS 0.1 05/01/2008 1947     CMP     Component Value Date/Time   NA 138 11/02/2012 1039   NA 132* 09/28/2012 0916   K 3.8 11/02/2012 1039   K 3.7 09/28/2012 0916   CL 99 11/02/2012 1039   CL 91* 09/28/2012 0916   CO2 28 11/02/2012 1039   CO2 30 09/28/2012 0916   GLUCOSE 106* 11/02/2012 1039   GLUCOSE 116* 09/28/2012 0916   BUN 18.0 11/02/2012 1039   BUN 18 09/28/2012 0916   CREATININE  1.1 11/02/2012 1039   CREATININE 1.2 09/28/2012 0916   CALCIUM 9.5 11/02/2012 1039   CALCIUM 9.2 09/28/2012 0916   PROT 7.4 11/02/2012 1039   PROT 7.5 06/23/2012 1037   ALBUMIN 3.9 11/02/2012 1039   ALBUMIN 3.9 06/23/2012 1037   AST 28 11/02/2012 1039   AST 24 06/23/2012 1037   ALT 48 11/02/2012 1039   ALT 35 06/23/2012 1037   ALKPHOS 66 11/02/2012 1039   ALKPHOS 53 06/23/2012 1037   BILITOT 0.71 11/02/2012 1039   BILITOT 0.4 06/23/2012 1037   GFRNONAA >60 05/01/2008 1947   GFRAA  Value: >60        The eGFR has been calculated using the MDRD equation. This calculation has not been validated in all clinical 05/01/2008 1947     Impression:  The patient is tolerating radiotherapy.  Plan:  Continue radiotherapy as planned. Discussed wound culture results with [facility] pharmacist.  Recommended Ceftin 500mg  BID. Will also start fluconazole for thrush, and patient will stop Lipitor during this medication.  Pt will discuss hearing loss w/ med onc.  ________________________________   Lonie Peak, M.D.

## 2012-11-21 ENCOUNTER — Ambulatory Visit: Payer: Medicare Other | Admitting: Nutrition

## 2012-11-21 ENCOUNTER — Telehealth: Payer: Self-pay | Admitting: *Deleted

## 2012-11-21 ENCOUNTER — Ambulatory Visit
Admission: RE | Admit: 2012-11-21 | Discharge: 2012-11-21 | Disposition: A | Discharge status: Home / Self Care | Payer: Medicare Other | Source: Ambulatory Visit | Attending: Radiation Oncology | Admitting: Radiation Oncology

## 2012-11-21 NOTE — Progress Notes (Signed)
Patient and wife present to nutrition followup. Patient reports he has a PEG tube infection. He was diagnosed with thrush. Food is becoming more difficult to eat secondary to taste alterations. Patient reporting constipation is an issue. Patient realizes that he has lost some hearing after his last chemotherapy. He states he had several days of extreme nausea.  Diagnosis: Food and nutrition related knowledge deficit continues.  New diagnosis of inadequate oral intake related to poor appetite and taste alterations as evidenced by 7 pound weight loss.  Estimated nutrition needs: 2200-2400 calories, 115-130 g protein, 2.4 L fluid.  Tube feeding: Patient will begin with Jevity 1.2 twice a day with 120 ml free water before and after feeding to provide 570 calories, 26.4 g protein, 862 mL free water.  Intervention: Patient was educated to begin Jevity 1.2 with 120 mL of free water before and after each bolus feeding.  He will use one can of Jevity 1.2 twice a day for now. He will continue to drink boost plus and milk shakes as desired along with soft foods. Patient was provided with a sample case of Jevity 1.2. Patient and wife were educated on giving bolus feedings. Patient was educated that he will require approximately 8 cans of Jevity 1.2 per day or 6 cans of boost plus a day if he is unable to consume foods. I have also recommended that patient increase free water either by mouth or tube to 80 ounces a day or 2400 mL. Written instructions were provided. Teach back method used.  Monitoring, evaluation, goals: Unable to tolerate adequate calories and protein to minimize weight loss. Patient will tolerate one can of Jevity 1.2 twice a day along with boost plus and oral nutrition to meet 100% of his estimated nutrition needs. Tube feedings will be increased as oral intake decreases.  Next visit: I will followup with patient by telephone this week. There is a followup scheduled for Monday, April 7, during  chemotherapy.

## 2012-11-21 NOTE — Telephone Encounter (Signed)
Wife called to report pt's hearing is getting much worse,  Most noticeably on the right side per pt, since this past Friday 3/21.  He already wears hearing aides,  But reports hearing is worse than usual and wife states pt has t.v. On "blasting" which he doesn't usually.   Instructed wife to make sure pt keeps appt w/ Belenda Cruise as scheduled this Friday 3/28 and I will call her back if Dr. Gaylyn Rong has any further instructions or anything to do before his next appt.. She verbalized understanding.

## 2012-11-21 NOTE — Telephone Encounter (Signed)
Called Omar Sawyer to inform her that Dr. Basilio Cairo has approved Ceftin because of the inflammation and  yellow-greenish drainage at the PEG insertion site.  The culture was negative for Staph.  She was also given a verbal order to continue Home visitations for 4 more visits.

## 2012-11-22 ENCOUNTER — Ambulatory Visit (HOSPITAL_COMMUNITY): Payer: Self-pay | Admitting: Dentistry

## 2012-11-22 ENCOUNTER — Ambulatory Visit
Admission: RE | Admit: 2012-11-22 | Discharge: 2012-11-22 | Disposition: A | Discharge status: Home / Self Care | Payer: Medicare Other | Source: Ambulatory Visit | Attending: Radiation Oncology | Admitting: Radiation Oncology

## 2012-11-22 ENCOUNTER — Encounter (HOSPITAL_COMMUNITY): Payer: Self-pay | Admitting: Dentistry

## 2012-11-22 VITALS — BP 108/70 | HR 138 | Temp 97.6°F | Wt 187.0 lb

## 2012-11-22 DIAGNOSIS — R432 Parageusia: Secondary | ICD-10-CM

## 2012-11-22 DIAGNOSIS — Z0189 Encounter for other specified special examinations: Secondary | ICD-10-CM

## 2012-11-22 DIAGNOSIS — R634 Abnormal weight loss: Secondary | ICD-10-CM

## 2012-11-22 DIAGNOSIS — C01 Malignant neoplasm of base of tongue: Secondary | ICD-10-CM

## 2012-11-22 DIAGNOSIS — B370 Candidal stomatitis: Secondary | ICD-10-CM

## 2012-11-22 DIAGNOSIS — Z09 Encounter for follow-up examination after completed treatment for conditions other than malignant neoplasm: Secondary | ICD-10-CM

## 2012-11-22 DIAGNOSIS — C099 Malignant neoplasm of tonsil, unspecified: Secondary | ICD-10-CM

## 2012-11-22 NOTE — Patient Instructions (Addendum)
RECOMMENDATIONS: 1. Brush after meals and at bedtime. Brush tongue daily. Use fluoride at bedtime. 2. Use trismus exercises as directed. 3. Use Biotene Rinse or salt water/baking soda rinses. Consider 1-5 dilution of peroxide and water rinses for thick saliva. 4. Multiple sips of water as needed. 5. RTC in two months for periodic oral examination status post radiation therapy .  Call if problems before then.  Dr. Kristin Bruins

## 2012-11-22 NOTE — Progress Notes (Signed)
11/22/2012  Patient:            Omar Sawyer Date of Birth:  [birth year] MRN:                [id number]  BP 108/70  Pulse 138  Temp(Src) 97.6 F (36.4 C)  Wt 187 lb (84.823 kg)  BMI 27.6 kg/m2  Omar Sawyer presents for a periodic oral examination during radiation therapy.    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 Patient currently undergoing active chemoradiation therapy. Patient has completed 7 of 35 radiation treatments.  REVIEW OF CHIEF COMPLAINTS: DRY MOUTH: Yes HARD TO SWALLOW: No HURT TO SWALLOW: No TASTE CHANGES: Taste is changing. SORES IN MOUTH: No, but patient had thrush on his tongue. TRISMUS SYMPTOMS: None SYMPTOM RELIEF:  Using Biotene Rinses. HOME ORAL HYGIENE REGIMEN:  BRUSHING: 2 X a day FLOSSING: 2 X a day RINSING: see above FLUORIDE: Doing fluoride at night having no problems TRISMUS EXERCISES: Maximum interincisal opening:  40 mm. 26 sticks.  DENTAL EXAM: ORAL HYGIENE(PLAQUE): Minimal plaque noted. Oral hygiene was stressed. Patient encouraged to brush his tongue daily. LOCATION OF MUCOSITIS: None noted DESCRIPTION OF SALIVA: Decreased, foamy saliva. Incipient xerostomia. ANY EXPOSED BONE: None noted OTHER WATCHED AREAS:  DIAGNOSES: 1.  Xerostomia  2.  Dysgeusia 3.  Oral candidiasis -affecting dorsum of tongue.  4.  Weight Loss  RECOMMENDATIONS: 1. Brush after meals and at bedtime. Brush tongue daily. Use fluoride at bedtime. 2. Use trismus exercises as directed. 3. Use Biotene Rinse or salt water/baking soda rinses. Consider 1-5 dilution of peroxide and water rinses for thick saliva. 4. Multiple sips of water as needed. 5. RTC in two months for periodic oral examination status post radiation therapy .  Call if problems before then.  Charlynne Pander, DDS

## 2012-11-23 ENCOUNTER — Ambulatory Visit
Admission: RE | Admit: 2012-11-23 | Discharge: 2012-11-23 | Disposition: A | Discharge status: Home / Self Care | Payer: Medicare Other | Source: Ambulatory Visit | Attending: Radiation Oncology | Admitting: Radiation Oncology

## 2012-11-24 ENCOUNTER — Other Ambulatory Visit (HOSPITAL_BASED_OUTPATIENT_CLINIC_OR_DEPARTMENT_OTHER): Payer: Medicare Other | Admitting: Lab

## 2012-11-24 ENCOUNTER — Ambulatory Visit (HOSPITAL_BASED_OUTPATIENT_CLINIC_OR_DEPARTMENT_OTHER): Payer: Medicare Other | Admitting: Oncology

## 2012-11-24 ENCOUNTER — Ambulatory Visit
Admission: RE | Admit: 2012-11-24 | Discharge: 2012-11-24 | Disposition: A | Discharge status: Home / Self Care | Payer: Medicare Other | Source: Ambulatory Visit | Attending: Radiation Oncology | Admitting: Radiation Oncology

## 2012-11-24 ENCOUNTER — Encounter: Payer: Self-pay | Admitting: Oncology

## 2012-11-24 VITALS — BP 125/62 | HR 68 | Temp 97.6°F | Resp 20 | Ht 69.0 in | Wt 188.4 lb

## 2012-11-24 DIAGNOSIS — C099 Malignant neoplasm of tonsil, unspecified: Secondary | ICD-10-CM

## 2012-11-24 DIAGNOSIS — B370 Candidal stomatitis: Secondary | ICD-10-CM

## 2012-11-24 LAB — CBC WITH DIFFERENTIAL/PLATELET
BASO%: 0.1 % (ref 0.0–2.0)
Eosinophils Absolute: 0 10*3/uL (ref 0.0–0.5)
LYMPH%: 4.5 % — ABNORMAL LOW (ref 14.0–49.0)
MONO#: 0.7 10*3/uL (ref 0.1–0.9)
NEUT#: 7.5 10*3/uL — ABNORMAL HIGH (ref 1.5–6.5)
Platelets: 140 10*3/uL (ref 140–400)
RBC: 4.69 10*6/uL (ref 4.20–5.82)
WBC: 8.6 10*3/uL (ref 4.0–10.3)
lymph#: 0.4 10*3/uL — ABNORMAL LOW (ref 0.9–3.3)
nRBC: 0 % (ref 0–0)

## 2012-11-24 LAB — BASIC METABOLIC PANEL (CC13)
CO2: 25 mEq/L (ref 22–29)
Chloride: 90 mEq/L — ABNORMAL LOW (ref 98–107)
Creatinine: 1.2 mg/dL (ref 0.7–1.3)
Potassium: 4.4 mEq/L (ref 3.5–5.1)
Sodium: 123 mEq/L — ABNORMAL LOW (ref 136–145)

## 2012-11-24 NOTE — Progress Notes (Signed)
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   PAST THERAPY:  Biopsy only.   CURRENT THERAPY:  Concurrent chemo with q3 weeks Cisplatin and daily radiation started 11/13/2012.   INTERVAL HISTORY: Omar Sawyer 72 y.o. male returns for regular follow up with is wife.  Received his first cycle of chemo last week. Reports that  His hearing is muffled. No ringing in his ears. Continues to eat all food by mouth. Flushes PEG tube with water only.   On fluconazole for thrush. Developed infection at PEG site and is on antibiotics. He denied fever, SOB, chest pain. The rest of the 14-point review of system was negative.   Past Medical History  Diagnosis Date  . Essential hypertension, benign   . Mixed hyperlipidemia   . Unspecified sinusitis (chronic)   . Edema   . Personal history of adenomatous colonic polyps 07/23/2012    2010  . Hearing loss   . Hx of prostatitis   . 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 Anxiety     mild new dx  . GERD (gastroesophageal reflux disease)   . Neuromuscular disorder     b/l tremors,   . Basal cell carcinoma     Past Surgical History  Procedure Laterality Date  . Colonoscopy  2011    polyps. Dr. Leone Payor.  Due now 2013  . Biopsy of right tonsil Right 10/03/2012    Squamous Cell Carcinoma     . Blepharoplasty      Current Outpatient Prescriptions  Medication Sig Dispense Refill  . amLODipine (NORVASC) 5 MG tablet Take 5 mg by mouth daily before breakfast.       . aspirin EC 81 MG tablet Take 81 mg by mouth every evening.       . cefUROXime (CEFTIN) 500 MG tablet Take 1 tablet (500 mg total) by mouth 2 (two) times daily. For 10 days.  20 tablet  0   . cholecalciferol (VITAMIN D) 1000 UNITS tablet Take 1,000 Units by mouth daily.      . fish oil-omega-3 fatty acids 1000 MG capsule Take 1 g by mouth daily.       . fluconazole (DIFLUCAN) 100 MG tablet Take 2 tabs today, then 1 tab daily for 20 more days. Do not take Lipitor when on this medication.  22 tablet  0  . hydrochlorothiazide (HYDRODIURIL) 25 MG tablet Take 25 mg by mouth every other day.       . lisinopril (PRINIVIL,ZESTRIL) 20 MG tablet Take 20 mg by mouth every evening.       Marland Kitchen LORazepam (ATIVAN) 0.5 MG tablet Take 1 tablet (0.5 mg total) by mouth every 6 (six) hours as needed (anticipation nausea or vomiting).  30 tablet  1  . metoprolol succinate (TOPROL-XL) 50 MG 24 hr tablet Take 75 mg by mouth daily before breakfast.       . omeprazole (PRILOSEC) 20 MG capsule Take 20 mg by mouth daily.       . ondansetron (ZOFRAN) 8 MG tablet Take 1 tablet (8 mg total) by mouth every 12 (twelve) hours as needed for nausea. Take two times a day  as needed for nausea or vomiting starting on the third day after chemotherapy.  30 tablet  1  . potassium chloride (K-DUR) 10 MEQ tablet Take 1 tablet (10 mEq total) by mouth daily.  30 tablet  6  . prochlorperazine (COMPAZINE) 10 MG tablet Take 1 tablet (10 mg total) by mouth every 6 (six) hours as needed (Nausea or vomiting).  30 tablet  1  . simvastatin (ZOCOR) 20 MG tablet Take 1 tablet by mouth at bedtime.       . sodium fluoride (FLUORISHIELD) 1.1 % GEL dental gel Brush and floss. Instill one drop of fluoride into fluoride tray. Place over teeth for 5 minutes. Remove. Spit out excess. Repeat nightly  120 mL  prn  . Tamsulosin HCl (FLOMAX) 0.4 MG CAPS Take 0.4 mg by mouth daily.        No current facility-administered medications for this visit.    ALLERGIES:  has No Known Allergies.   Filed Vitals:   11/24/12 0835  BP: 125/62  Pulse: 68  Temp: 97.6 F (36.4 C)  Resp: 20   Wt Readings from Last 3 Encounters:  11/24/12 188 lb 6.4 oz  (85.458 kg)  11/22/12 187 lb (84.823 kg)  11/20/12 187 lb 3.2 oz (84.913 kg)   ECOG Performance status: 0  PHYSICAL EXAMINATION:   General:  well-nourished man, in no acute distress.  Eyes:  no scleral icterus.  ENT:  There were no oropharyngeal lesions.  Neck was without thyromegaly.  Lymphatics:  Negative cervical, supraclavicular or axillary adenopathy.  Respiratory: lungs were clear bilaterally without wheezing or crackles.  Cardiovascular:  Regular rate and rhythm, S1/S2, without murmur, rub or gallop.  There was no pedal edema.  GI:  abdomen was soft, flat, nontender, nondistended, without organomegaly. PEG tube showed mild erythema with a small amount of purulent discharge.  Muscoloskeletal:  no spinal tenderness of palpation of vertebral spine.  Skin exam was without echymosis, petichae.  Neuro exam was nonfocal.  Patient was able to get on and off exam table without assistance.  Gait was normal.  Patient was alerted and oriented.  Attention was good.   Language was appropriate.  Mood was normal without depression.  Speech was not pressured.  Thought content was not tangential.      LABORATORY/RADIOLOGY DATA:  Lab Results  Component Value Date   WBC 8.6 11/24/2012   HGB 14.3 11/24/2012   HCT 41.8 11/24/2012   PLT 140 11/24/2012   GLUCOSE 136* 11/24/2012   CHOL 112 06/23/2012   TRIG 103.0 06/23/2012   HDL 33.60* 06/23/2012   LDLCALC 58 06/23/2012   ALKPHOS 66 11/02/2012   ALT 48 11/02/2012   AST 28 11/02/2012   NA 123 Repeated and Verified* 11/24/2012   K 4.4 11/24/2012   CL 90 Repeated and Verified* 11/24/2012   CREATININE 1.2 11/24/2012   BUN 24.8 11/24/2012   CO2 25 11/24/2012   INR 1.09 11/07/2012    ASSESSMENT AND PLAN:   1.  Diagnosis:  Head and neck cancer. 2.  Treatment:  Concurrent chemo radiation with every 3 weeks Cisplatin and daily radiation.  He reports grade 2 hearing loss. Will reassess this at next visit. May need dose reduction of Cisplatin with future doses. 3.  Mouth  sore prevention:  Salt/baking soda mouth rinses at least 3x/day between meals in addition to Biotene  4.  To prevent esophageal stricture:  Please perform swallowing exercises several times a day. 5.  PEG tube infection: On Ceftin per  Rad Onc. 6.  Oral thrush: On fluconazole. 7.  Nausea/vomiting prophylaxis:  - Prochloroperzaine (Brand name:  Compazine):  10mg  by mouth, every 6 hours as needed.  - Odansetron (Brand name:  Zofran):  4-8 mg by mouth or under the tongue, every 8-12 hours as needed.  -  Lorazepam (Brand name:  Ativan):  0.5mg  by mouth or under the tongue, every 6 hours; especially before meal for anticipatory nausea and vomiting.   8. Follow up: 4/7 for next cycle of chemo.   The length of time of the face-to-face encounter was 15 minutes. More than 50% of time was spent counseling and coordination of care.

## 2012-11-24 NOTE — Patient Instructions (Addendum)
Mouth rinse: Alternate between the following:  - salt/baking soda (1 teaspoon of salt; 1 teaspoon of baking soda; in 1 Liter of water). May mix up ahead of time. Use 3-4 times per day to prevent mouth sores. - diluted hydrogen peroxide (1 part peroxide to 4 parts water). You will need to mix this just before use. Use 3-4 times per day to thin the thick mucus in your mouth.

## 2012-11-27 ENCOUNTER — Ambulatory Visit
Admission: RE | Admit: 2012-11-27 | Discharge: 2012-11-27 | Disposition: A | Discharge status: Home / Self Care | Payer: Medicare Other | Source: Ambulatory Visit | Attending: Radiation Oncology | Admitting: Radiation Oncology

## 2012-11-27 VITALS — BP 118/63 | HR 72 | Temp 97.9°F | Resp 20 | Wt 188.4 lb

## 2012-11-27 DIAGNOSIS — C099 Malignant neoplasm of tonsil, unspecified: Secondary | ICD-10-CM

## 2012-11-27 MED ORDER — SUCRALFATE 1 G PO TABS: ORAL_TABLET | ORAL | Status: DC

## 2012-11-27 MED ORDER — MAGIC MOUTHWASH W/LIDOCAINE: ORAL | Status: DC

## 2012-11-27 MED ORDER — HYDROCODONE-ACETAMINOPHEN 7.5-325 MG/15ML PO SOLN: 15.0000 mL | ORAL | Status: DC | PRN

## 2012-11-27 NOTE — Progress Notes (Addendum)
   Weekly Management Note:  outpatient Current Dose:  22 Gy  Projected Dose: 70 Gy   Narrative:  The patient presents for routine under treatment assessment.  CBCT/MVCT images/Port film x-rays were reviewed.  The chart was checked. Starting to have a sore throat and mouth. Eating all food by mouth. PEG site much better with Ceftin.  Physical Findings:  weight is 188 lb 6.4 oz (85.458 kg). His oral temperature is 97.9 F (36.6 C). His blood pressure is 118/63 and his pulse is 72. His respiration is 20.  Patchy mucositis in oropharynx.  Tonsil tumor on right starting to shrink.  No thrush.  Peg site clean, no active infection  CBC    Component Value Date/Time   WBC 8.6 11/24/2012 0812   WBC 6.8 11/07/2012 0745   RBC 4.69 11/24/2012 0812   RBC 5.00 11/07/2012 0745   HGB 14.3 11/24/2012 0812   HGB 15.2 11/07/2012 0745   HCT 41.8 11/24/2012 0812   HCT 44.9 11/07/2012 0745   PLT 140 11/24/2012 0812   PLT 198 11/07/2012 0745   MCV 89.1 11/24/2012 0812   MCV 89.8 11/07/2012 0745   MCH 30.5 11/24/2012 0812   MCH 30.4 11/07/2012 0745   MCHC 34.2 11/24/2012 0812   MCHC 33.9 11/07/2012 0745   RDW 13.3 11/24/2012 0812   RDW 13.1 11/07/2012 0745   LYMPHSABS 0.4* 11/24/2012 0812   LYMPHSABS 1.0 05/01/2008 1947   MONOABS 0.7 11/24/2012 0812   MONOABS 0.7 05/01/2008 1947   EOSABS 0.0 11/24/2012 0812   EOSABS 0.1 05/01/2008 1947   BASOSABS 0.0 11/24/2012 0812   BASOSABS 0.1 05/01/2008 1947    CMP     Component Value Date/Time   NA 123 Repeated and Verified* 11/24/2012 0812   NA 132* 09/28/2012 0916   K 4.4 11/24/2012 0812   K 3.7 09/28/2012 0916   CL 90 Repeated and Verified* 11/24/2012 0812   CL 91* 09/28/2012 0916   CO2 25 11/24/2012 0812   CO2 30 09/28/2012 0916   GLUCOSE 136* 11/24/2012 0812   GLUCOSE 116* 09/28/2012 0916   BUN 24.8 11/24/2012 0812   BUN 18 09/28/2012 0916   CREATININE 1.2 11/24/2012 0812   CREATININE 1.2 09/28/2012 0916   CALCIUM 9.1 11/24/2012 0812   CALCIUM 9.2 09/28/2012 0916   PROT 7.4 11/02/2012  1039   PROT 7.5 06/23/2012 1037   ALBUMIN 3.9 11/02/2012 1039   ALBUMIN 3.9 06/23/2012 1037   AST 28 11/02/2012 1039   AST 24 06/23/2012 1037   ALT 48 11/02/2012 1039   ALT 35 06/23/2012 1037   ALKPHOS 66 11/02/2012 1039   ALKPHOS 53 06/23/2012 1037   BILITOT 0.71 11/02/2012 1039   BILITOT 0.4 06/23/2012 1037   GFRNONAA >60 05/01/2008 1947   GFRAA  Value: >60        The eGFR has been calculated using the MDRD equation. This calculation has not been validated in all clinical 05/01/2008 1947    Impression:  The patient is tolerating radiotherapy.  Plan:  Continue radiotherapy as planned.  For sore throat: Rx given for Sucralfate, MMW with lidocaine, and Hycet.  Miralax to be used PRN constipation.  Baking soda rinses PRN as well.  In regards to the mask trial, the patient has signed copies of the consent forms and all trial related questions were answered.  ________________________________   Lonie Peak, M.D.

## 2012-11-27 NOTE — Addendum Note (Signed)
Encounter addended by: Lonie Peak, MD on: 11/27/2012  9:42 AM<BR>     Documentation filed: Visit Diagnoses, Notes Section

## 2012-11-27 NOTE — Progress Notes (Signed)
Patient here weekly rad tx  B/l neck, , using radiaplex gel daily when he remembers, alert,oriented x3, flushes peg  Tube looks good,no infection noted, flushing with free water 2x day 40 ounces stated,eating soft foods,  Sore throat only c/o, 9:05 AM

## 2012-11-28 ENCOUNTER — Ambulatory Visit
Admission: RE | Admit: 2012-11-28 | Discharge: 2012-11-28 | Disposition: A | Discharge status: Home / Self Care | Payer: Medicare Other | Source: Ambulatory Visit | Attending: Radiation Oncology | Admitting: Radiation Oncology

## 2012-11-29 ENCOUNTER — Ambulatory Visit
Admission: RE | Admit: 2012-11-29 | Discharge: 2012-11-29 | Disposition: A | Discharge status: Home / Self Care | Payer: Medicare Other | Source: Ambulatory Visit | Attending: Radiation Oncology | Admitting: Radiation Oncology

## 2012-11-30 ENCOUNTER — Ambulatory Visit
Admission: RE | Admit: 2012-11-30 | Discharge: 2012-11-30 | Disposition: A | Discharge status: Home / Self Care | Payer: Medicare Other | Source: Ambulatory Visit | Attending: Radiation Oncology | Admitting: Radiation Oncology

## 2012-12-01 ENCOUNTER — Ambulatory Visit
Admission: RE | Admit: 2012-12-01 | Discharge: 2012-12-01 | Disposition: A | Discharge status: Home / Self Care | Payer: Medicare Other | Source: Ambulatory Visit | Attending: Radiation Oncology | Admitting: Radiation Oncology

## 2012-12-04 ENCOUNTER — Encounter: Payer: Self-pay | Admitting: Oncology

## 2012-12-04 ENCOUNTER — Ambulatory Visit (HOSPITAL_BASED_OUTPATIENT_CLINIC_OR_DEPARTMENT_OTHER): Payer: Medicare Other

## 2012-12-04 ENCOUNTER — Ambulatory Visit
Admission: RE | Admit: 2012-12-04 | Discharge: 2012-12-04 | Disposition: A | Discharge status: Home / Self Care | Payer: Medicare Other | Source: Ambulatory Visit | Attending: Radiation Oncology | Admitting: Radiation Oncology

## 2012-12-04 ENCOUNTER — Other Ambulatory Visit (HOSPITAL_BASED_OUTPATIENT_CLINIC_OR_DEPARTMENT_OTHER): Payer: Medicare Other | Admitting: Lab

## 2012-12-04 ENCOUNTER — Ambulatory Visit: Payer: Medicare Other | Admitting: Nutrition

## 2012-12-04 ENCOUNTER — Ambulatory Visit: Payer: Medicare Other | Attending: Radiation Oncology

## 2012-12-04 ENCOUNTER — Ambulatory Visit: Payer: Self-pay

## 2012-12-04 ENCOUNTER — Ambulatory Visit (HOSPITAL_BASED_OUTPATIENT_CLINIC_OR_DEPARTMENT_OTHER): Payer: Medicare Other | Admitting: Oncology

## 2012-12-04 VITALS — BP 101/60 | HR 79 | Temp 97.7°F | Resp 20 | Ht 69.0 in | Wt 185.4 lb

## 2012-12-04 DIAGNOSIS — C099 Malignant neoplasm of tonsil, unspecified: Secondary | ICD-10-CM

## 2012-12-04 DIAGNOSIS — K1230 Oral mucositis (ulcerative), unspecified: Secondary | ICD-10-CM

## 2012-12-04 DIAGNOSIS — R1311 Dysphagia, oral phase: Secondary | ICD-10-CM | POA: Insufficient documentation

## 2012-12-04 DIAGNOSIS — Z5111 Encounter for antineoplastic chemotherapy: Secondary | ICD-10-CM

## 2012-12-04 DIAGNOSIS — IMO0001 Reserved for inherently not codable concepts without codable children: Secondary | ICD-10-CM | POA: Insufficient documentation

## 2012-12-04 DIAGNOSIS — K121 Other forms of stomatitis: Secondary | ICD-10-CM

## 2012-12-04 HISTORY — DX: Oral mucositis (ulcerative), unspecified: K12.30

## 2012-12-04 LAB — CBC WITH DIFFERENTIAL/PLATELET
Basophils Absolute: 0 10*3/uL (ref 0.0–0.1)
Eosinophils Absolute: 0 10*3/uL (ref 0.0–0.5)
HGB: 13.3 g/dL (ref 13.0–17.1)
MONO#: 0.6 10*3/uL (ref 0.1–0.9)
NEUT#: 3.2 10*3/uL (ref 1.5–6.5)
RBC: 4.32 10*6/uL (ref 4.20–5.82)
RDW: 13.2 % (ref 11.0–14.6)
WBC: 4.4 10*3/uL (ref 4.0–10.3)

## 2012-12-04 LAB — COMPREHENSIVE METABOLIC PANEL (CC13)
Albumin: 3.6 g/dL (ref 3.5–5.0)
CO2: 23 mEq/L (ref 22–29)
Calcium: 9.9 mg/dL (ref 8.4–10.4)
Chloride: 90 mEq/L — ABNORMAL LOW (ref 98–107)
Glucose: 123 mg/dl — ABNORMAL HIGH (ref 70–99)
Potassium: 5.2 mEq/L — ABNORMAL HIGH (ref 3.5–5.1)
Sodium: 124 mEq/L — ABNORMAL LOW (ref 136–145)
Total Protein: 7.3 g/dL (ref 6.4–8.3)

## 2012-12-04 MED ORDER — SODIUM CHLORIDE 0.9 % IV SOLN
150.0000 mg | Freq: Once | INTRAVENOUS | Status: AC
  Administered 2012-12-04: 150 mg via INTRAVENOUS
  Filled 2012-12-04: qty 5

## 2012-12-04 MED ORDER — FENTANYL 12 MCG/HR TD PT72: 1.0000 | MEDICATED_PATCH | TRANSDERMAL | Status: DC

## 2012-12-04 MED ORDER — SODIUM CHLORIDE 0.9 % IV SOLN
100.0000 mg/m2 | Freq: Once | INTRAVENOUS | Status: AC
  Administered 2012-12-04: 200 mg via INTRAVENOUS
  Filled 2012-12-04: qty 200

## 2012-12-04 MED ORDER — POTASSIUM CHLORIDE 2 MEQ/ML IV SOLN
Freq: Once | INTRAVENOUS | Status: AC
  Administered 2012-12-04: 11:00:00 via INTRAVENOUS
  Filled 2012-12-04: qty 10

## 2012-12-04 MED ORDER — DEXAMETHASONE SODIUM PHOSPHATE 4 MG/ML IJ SOLN
12.0000 mg | Freq: Once | INTRAMUSCULAR | Status: AC
  Administered 2012-12-04: 12 mg via INTRAVENOUS

## 2012-12-04 MED ORDER — PALONOSETRON HCL INJECTION 0.25 MG/5ML
0.2500 mg | Freq: Once | INTRAVENOUS | Status: AC
  Administered 2012-12-04: 0.25 mg via INTRAVENOUS

## 2012-12-04 NOTE — Patient Instructions (Signed)
 Cancer Center Discharge Instructions for Patients Receiving Chemotherapy  Today you received the following chemotherapy agents Cisplatin To help prevent nausea and vomiting after your treatment, we encourage you to take your nausea medication as prescribed.If you develop nausea and vomiting that is not controlled by your nausea medication, call the clinic. If it is after clinic hours your family physician or the after hours number for the clinic or go to the Emergency Department.   BELOW ARE SYMPTOMS THAT SHOULD BE REPORTED IMMEDIATELY:  *FEVER GREATER THAN 100.5 F  *CHILLS WITH OR WITHOUT FEVER  NAUSEA AND VOMITING THAT IS NOT CONTROLLED WITH YOUR NAUSEA MEDICATION  *UNUSUAL SHORTNESS OF BREATH  *UNUSUAL BRUISING OR BLEEDING  TENDERNESS IN MOUTH AND THROAT WITH OR WITHOUT PRESENCE OF ULCERS  *URINARY PROBLEMS  *BOWEL PROBLEMS  UNUSUAL RASH Items with * indicate a potential emergency and should be followed up as soon as possible.  One of the nurses will contact you 24 hours after your treatment. Please let the nurse know about any problems that you may have experienced. Feel free to call the clinic you have any questions or concerns. The clinic phone number is [phone].   I have been informed and understand all the instructions given to me. I know to contact the clinic, my physician, or go to the Emergency Department if any problems should occur. I do not have any questions at this time, but understand that I may call the clinic during office hours   should I have any questions or need assistance in obtaining follow up care.    __________________________________________  _____________  __________ Signature of Patient or Authorized Representative            Date                   Time    __________________________________________ Nurse's Signature

## 2012-12-04 NOTE — Progress Notes (Signed)
1522 Patient IV in left hand noted to be swollen. Patient denies pain. IV drips to gravity, no redness noted. IV sites rotated.

## 2012-12-04 NOTE — Progress Notes (Signed)
Patient states he is trying to eat and drink. He verbalizes the need to use his feeding tube however has not done so as of yet. Patient reports he had nausea this morning. He took an Ativan which did help.  Nutrition diagnosis food and nutrition related knowledge deficit and inadequate oral intake both continue.  Intervention: I have again educated patient to begin Jevity 1.2 with 120 mL of free water before and after bolus feedings. He will either utilize Jevity 1.2 through his feeding tube or he will drink boost plus. I educated him that he needs to do 4 daily along with oral diet to meet estimated needs. Patient verbalized understanding. Teach back method used.  Monitoring, evaluation, goals: Patient has been unable to increase oral intake. Patient also is not utilizing feeding tube as previously instructed. He will work to increase oral intake along with tube feedings to meet estimated nutrition needs.  Next visit: I will followup with patient by phone.

## 2012-12-04 NOTE — Patient Instructions (Addendum)
1.  Head and neck cancer. 2.  Treatment:  Chemoradiation. 3.  Issue:  Hearing loss.   4.  Options:  *  Continue Cisplatin but at weekly dose.  *  Switch chemo to Carboplatin/Taxol.  No hearing loss with this regimen; but higher chance of lower blood count, more mouth sore.  *  Stop chemo and just continue with radiation alone.

## 2012-12-04 NOTE — Progress Notes (Signed)
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   PAST THERAPY:  Biopsy only.   CURRENT THERAPY:  Started concurrent chemo with q3 weeks Cisplatin and daily radiation on 11/13/2012.   INTERVAL HISTORY: Omar Sawyer 72 y.o. male returns for regular follow up with his wife.  He develops worsening hearing capacity in the right ear after one dose of chemotherapy. Of note he had hearing aid to begin with. He denied tinnitus. He had some nausea with chemotherapy without vomiting. He has decreased stamina with chemotherapy. He was able to ambulate however much less active compared to prior to chemotherapy. He has mucositis and has been taking hydrocodone liquid elixir about twice a day for his mucositis pain. He has not been very adherent with swallowing exercise due to mucositis pain. His PEG tube has been fine without discharge or pain. Due to his mucositis pain, he has decreased oral intake. He only drinks boost or liquid food. Therefore he is having weight loss. The rest of the 14 point review of system was negative.  Past Medical History  Diagnosis Date  . Essential hypertension, benign   . Mixed hyperlipidemia   . Unspecified sinusitis (chronic)   . Edema   . Personal history of adenomatous colonic polyps 07/23/2012    2010  . Hearing loss   . Hx of prostatitis   . 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 Anxiety     mild new dx  . GERD (gastroesophageal reflux disease)   . Neuromuscular disorder     b/l tremors,   . Basal cell carcinoma   . Mucositis 12/04/2012    Past Surgical History  Procedure Laterality Date  . Colonoscopy  2011    polyps. Dr. Leone Payor.  Due now 2013  . Biopsy  of right tonsil Right 10/03/2012    Squamous Cell Carcinoma     . Blepharoplasty      Current Outpatient Prescriptions  Medication Sig Dispense Refill  . Alum & Mag Hydroxide-Simeth (MAGIC MOUTHWASH W/LIDOCAINE) SOLN 1part nystatin,1part Maaloxplus,1part benadryl,3parts 2%viscous lidocaine. Swish/swallow up to QID, 30 min before meals/bedtime  480 mL  5  . amLODipine (NORVASC) 5 MG tablet Take 5 mg by mouth daily before breakfast.       . aspirin EC 81 MG tablet Take 81 mg by mouth every evening.       . cholecalciferol (VITAMIN D) 1000 UNITS tablet Take 1,000 Units by mouth daily.      . fish oil-omega-3 fatty acids 1000 MG capsule Take 1 g by mouth daily.       . fluconazole (DIFLUCAN) 100 MG tablet Take 2 tabs today, then 1 tab daily for 20 more days. Do not take Lipitor when on this medication.  22 tablet  0  . hydrochlorothiazide (HYDRODIURIL) 25 MG tablet Take 25 mg by mouth every other day.       Marland Kitchen HYDROcodone-acetaminophen (HYCET) 7.5-325 mg/15 ml solution Take 15 mLs by mouth every 4 (four) hours as needed for pain.  473 mL  3  . lisinopril (PRINIVIL,ZESTRIL) 20 MG tablet Take 20 mg by mouth every evening.       Marland Kitchen  LORazepam (ATIVAN) 0.5 MG tablet Take 1 tablet (0.5 mg total) by mouth every 6 (six) hours as needed (anticipation nausea or vomiting).  30 tablet  1  . metoprolol succinate (TOPROL-XL) 50 MG 24 hr tablet Take 75 mg by mouth daily before breakfast.       . omeprazole (PRILOSEC) 20 MG capsule Take 20 mg by mouth daily.       . ondansetron (ZOFRAN) 8 MG tablet Take 1 tablet (8 mg total) by mouth every 12 (twelve) hours as needed for nausea. Take two times a day as needed for nausea or vomiting starting on the third day after chemotherapy.  30 tablet  1  . potassium chloride (K-DUR) 10 MEQ tablet Take 1 tablet (10 mEq total) by mouth daily.  30 tablet  6  . prochlorperazine (COMPAZINE) 10 MG tablet Take 1 tablet (10 mg total) by mouth every 6 (six) hours as needed (Nausea or  vomiting).  30 tablet  1  . simvastatin (ZOCOR) 20 MG tablet Take 1 tablet by mouth at bedtime.       . sodium fluoride (FLUORISHIELD) 1.1 % GEL dental gel Brush and floss. Instill one drop of fluoride into fluoride tray. Place over teeth for 5 minutes. Remove. Spit out excess. Repeat nightly  120 mL  prn  . Tamsulosin HCl (FLOMAX) 0.4 MG CAPS Take 0.4 mg by mouth daily.       . fentaNYL (DURAGESIC - DOSED MCG/HR) 12 MCG/HR Place 1 patch (12.5 mcg total) onto the skin every 3 (three) days.  5 patch  0  . sucralfate (CARAFATE) 1 G tablet crush 1 tablet in 10 mL H20 and swallow up to four times daily to soothe throat  45 tablet  5   No current facility-administered medications for this visit.    ALLERGIES:  has No Known Allergies.   Filed Vitals:   12/04/12 0840  BP: 101/60  Pulse: 79  Temp: 97.7 F (36.5 C)  Resp: 20   Wt Readings from Last 3 Encounters:  12/04/12 185 lb 6.4 oz (84.097 kg)  11/27/12 188 lb 6.4 oz (85.458 kg)  11/24/12 188 lb 6.4 oz (85.458 kg)   ECOG Performance status: 1  PHYSICAL EXAMINATION:   General:  well-nourished man, in no acute distress.  Eyes:  no scleral icterus.  ENT:  There were no oropharyngeal lesions.  Neck was without thyromegaly.  Lymphatics:  Negative cervical, supraclavicular or axillary adenopathy.  Respiratory: lungs were clear bilaterally without wheezing or crackles.  Cardiovascular:  Regular rate and rhythm, S1/S2, without murmur, rub or gallop.  There was no pedal edema.  GI:  abdomen was soft, flat, nontender, nondistended, without organomegaly. PEG tube showed mild erythema at the stopper site due to tight fitting but not purulent discharge.  Muscoloskeletal:  no spinal tenderness of palpation of vertebral spine.  Skin exam was without echymosis, petichae.  Neuro exam was nonfocal.  Patient was able to get on and off exam table without assistance.  Gait was normal.  Patient was alerted and oriented.  Attention was good.   Language was  appropriate.  Mood was normal without depression.  Speech was not pressured.  Thought content was not tangential.      LABORATORY/RADIOLOGY DATA:  Lab Results  Component Value Date   WBC 4.4 12/04/2012   HGB 13.3 12/04/2012   HCT 37.6* 12/04/2012   PLT 202 12/04/2012   GLUCOSE 136* 11/24/2012   CHOL 112 06/23/2012   TRIG 103.0 06/23/2012  HDL 33.60* 06/23/2012   LDLCALC 58 06/23/2012   ALKPHOS 66 11/02/2012   ALT 48 11/02/2012   AST 28 11/02/2012   NA 123 Repeated and Verified* 11/24/2012   K 4.4 11/24/2012   CL 90 Repeated and Verified* 11/24/2012   CREATININE 1.2 11/24/2012   BUN 24.8 11/24/2012   CO2 25 11/24/2012   INR 1.09 11/07/2012    ASSESSMENT AND PLAN:   1.  Diagnosis:  Head and neck cancer. 2.  Treatment:  Concurrent chemo radiation with every 3 weeks Cisplatin and daily radiation. 3.  Side effects: grade 2-3 right sided worsening hearing loss from baseline; grade 1-2 fatigue; grade 2 mucositis; grade 2 anorexia/weight loss.    4.  Options:  *  Continue Cisplatin but at weekly dose.  *  Switch chemo to Carboplatin/Taxol.  No hearing loss with this regimen; but higher chance of lower blood count, more mouth sore.  *  Stop chemo and just continue with radiation alone.  He would like to continue with the 2nd of q3week Cisplatin.   5.  Mucositis:  He has Lortab elixir.  I added on Fentanyl patch 12.69mcg/hr with anticipation with worsening mucositis.  6.  To prevent esophageal stricture:  I advised him to perform swallowing exercises more routinely.  7.  Nausea/vomiting medications:  - Prochloroperzaine (Brand name:  Compazine):  10mg  by mouth, every 6 hours as needed.  - Odansetron (Brand name:  Zofran):  4-8 mg by mouth or under the tongue, every 8-12 hours as needed.  -  Lorazepam (Brand name:  Ativan):  0.5mg  by mouth or under the tongue, every 6 hours; especially before meal for anticipatory nausea and vomiting.   Follow up:  In about 10 days to ensure he is doing well.        The length of time of the face-to-face encounter was 25 minutes. More than 50% of time was spent counseling and coordination of care.      Jaslynn Thome T. Gaylyn Rong, M.D.

## 2012-12-05 ENCOUNTER — Telehealth: Payer: Self-pay | Admitting: Internal Medicine

## 2012-12-05 ENCOUNTER — Encounter: Payer: Self-pay | Admitting: Radiation Oncology

## 2012-12-05 ENCOUNTER — Ambulatory Visit
Admission: RE | Admit: 2012-12-05 | Discharge: 2012-12-05 | Disposition: A | Discharge status: Home / Self Care | Payer: Medicare Other | Source: Ambulatory Visit | Attending: Radiation Oncology | Admitting: Radiation Oncology

## 2012-12-05 VITALS — BP 129/74 | HR 73 | Temp 97.5°F | Wt 185.7 lb

## 2012-12-05 DIAGNOSIS — C099 Malignant neoplasm of tonsil, unspecified: Secondary | ICD-10-CM

## 2012-12-05 NOTE — Progress Notes (Signed)
   Weekly Management Note:  outpatient Current Dose:  34 Gy  Projected Dose: 70 Gy   Narrative:  The patient presents for routine under treatment assessment.  CBCT/MVCT images/Port film x-rays were reviewed.  The chart was checked. Had diarrhea last night through this morning. It has resolved. He is going to take Imodium as recommended by med onc, as needed. His sodium is low and he is going to drink Gatorade for this. He is also able to shakes, eggs, chicken and rice soup. He reports that he is using the Magic mouthwash and the sucralfate  And hycet as needed. He is  meeting with the swallowing therapist. Pain in the throat is 5/10 when swallowing. He denies fevers or vomiting  Physical Findings:  weight is 185 lb 11.2 oz (84.233 kg). His temperature is 97.5 F (36.4 C). His blood pressure is 129/74 and his pulse is 73.  tumor in the tonsil region is no longer visible. He has confluent mucositis in the oropharynx. Mildly erythematous skin over his neck  CBC    Component Value Date/Time   WBC 4.4 12/04/2012 0814   WBC 6.8 11/07/2012 0745   RBC 4.32 12/04/2012 0814   RBC 5.00 11/07/2012 0745   HGB 13.3 12/04/2012 0814   HGB 15.2 11/07/2012 0745   HCT 37.6* 12/04/2012 0814   HCT 44.9 11/07/2012 0745   PLT 202 12/04/2012 0814   PLT 198 11/07/2012 0745   MCV 87.0 12/04/2012 0814   MCV 89.8 11/07/2012 0745   MCH 30.8 12/04/2012 0814   MCH 30.4 11/07/2012 0745   MCHC 35.4 12/04/2012 0814   MCHC 33.9 11/07/2012 0745   RDW 13.2 12/04/2012 0814   RDW 13.1 11/07/2012 0745   LYMPHSABS 0.5* 12/04/2012 0814   LYMPHSABS 1.0 05/01/2008 1947   MONOABS 0.6 12/04/2012 0814   MONOABS 0.7 05/01/2008 1947   EOSABS 0.0 12/04/2012 0814   EOSABS 0.1 05/01/2008 1947   BASOSABS 0.0 12/04/2012 0814   BASOSABS 0.1 05/01/2008 1947    CMP     Component Value Date/Time   NA 124* 12/04/2012 0814   NA 132* 09/28/2012 0916   K 5.2* 12/04/2012 0814   K 3.7 09/28/2012 0916   CL 90* 12/04/2012 0814   CL 91* 09/28/2012 0916   CO2 23 12/04/2012 0814   CO2  30 09/28/2012 0916   GLUCOSE 123* 12/04/2012 0814   GLUCOSE 116* 09/28/2012 0916   BUN 21.8 12/04/2012 0814   BUN 18 09/28/2012 0916   CREATININE 1.2 12/04/2012 0814   CREATININE 1.2 09/28/2012 0916   CALCIUM 9.9 12/04/2012 0814   CALCIUM 9.2 09/28/2012 0916   PROT 7.3 12/04/2012 0814   PROT 7.5 06/23/2012 1037   ALBUMIN 3.6 12/04/2012 0814   ALBUMIN 3.9 06/23/2012 1037   AST 17 12/04/2012 0814   AST 24 06/23/2012 1037   ALT 25 12/04/2012 0814   ALT 35 06/23/2012 1037   ALKPHOS 72 12/04/2012 0814   ALKPHOS 53 06/23/2012 1037   BILITOT 0.36 12/04/2012 0814   BILITOT 0.4 06/23/2012 1037   GFRNONAA >60 05/01/2008 1947   GFRAA  Value: >60        The eGFR has been calculated using the MDRD equation. This calculation has not been validated in all clinical 05/01/2008 1947     Impression:  The patient is tolerating radiotherapy.  Plan:  Continue radiotherapy as planned.  ________________________________   Lonie Peak, M.D.

## 2012-12-05 NOTE — Telephone Encounter (Signed)
Omar Sawyer wife called this am to report that Omar Sawyer has had several small liquid BM's occurring this morning after receiving C2 cisplatin yesterday.  Not particularly foul-smelling as far as they can tell.  No fever, taking liquids PO.  I advised him to continue to take in fluids, including gatorade (last sodium was 124).  I told him to take an immodium tablet so he can get to radiation and if it continues to be a problem, to check in with clinic later in the day.

## 2012-12-05 NOTE — Progress Notes (Addendum)
.    Mr Solorio has received  17 fractions to his right Tonsillar Region.  He received chemotherapy yesterday with Cisplatin with resulting watery diarrhea x 6 an ended at 7 am this morning.  The Med/Onc attending ordered imodium.  He also received a call this am that his Sodium level is 124 and given instructions to drink more Gatorade. He C/O of a sore throat, but states this is tolerable.  Note mucositis at the back of the throat.   He has a Peg tube and he is only flushing it presently.  Lower face and neck are red, but skin is intact.  He is performing his swallowing exercises and he states that he saw Almira Coaster yesterday.  Weight loss of 3 lbs since 11/28/11.  Drinking 4 boost, and eating eggs, and chicken and rice soup.    Pain in Throat level 3.  Level 5 when swallowing.

## 2012-12-06 ENCOUNTER — Ambulatory Visit
Admission: RE | Admit: 2012-12-06 | Discharge: 2012-12-06 | Disposition: A | Discharge status: Home / Self Care | Payer: Medicare Other | Source: Ambulatory Visit | Attending: Radiation Oncology | Admitting: Radiation Oncology

## 2012-12-06 ENCOUNTER — Telehealth: Payer: Self-pay | Admitting: Oncology

## 2012-12-06 ENCOUNTER — Telehealth: Payer: Self-pay | Admitting: Nutrition

## 2012-12-06 NOTE — Telephone Encounter (Signed)
Called patient on telephone for followup. I spoke with patient's wife who reports patient is still refusing to use his feeding tube for nutrition support and for water flushes. He has not been eating and drinking enough. Patient's wife states that his nausea has improved. She reports patient will have to come to decision to use feeding tube on his own.  Nutrition diagnosis: Food and nutrition related knowledge deficit and inadequate oral intake both continue.  Intervention: I have provided support and encouragement for patient's wife. I've encouraged her to contact me if she has any questions regarding use of his feeding tube for enteral nutrition support and free water flushes.  Patient's wife expresses appreciation.  Next visit: I will continue to followup with patient by phone. They're to call me if they develop questions or concerns.

## 2012-12-06 NOTE — Telephone Encounter (Signed)
lvm for pt regarding to 4.28.14 appt...michelle added tx

## 2012-12-06 NOTE — Telephone Encounter (Signed)
emaield Omar Sawyer to add tx....will call pt with d/t

## 2012-12-07 ENCOUNTER — Ambulatory Visit
Admission: RE | Admit: 2012-12-07 | Discharge: 2012-12-07 | Disposition: A | Discharge status: Home / Self Care | Payer: Medicare Other | Source: Ambulatory Visit | Attending: Radiation Oncology | Admitting: Radiation Oncology

## 2012-12-08 ENCOUNTER — Ambulatory Visit
Admission: RE | Admit: 2012-12-08 | Discharge: 2012-12-08 | Disposition: A | Discharge status: Home / Self Care | Payer: Medicare Other | Source: Ambulatory Visit | Attending: Radiation Oncology | Admitting: Radiation Oncology

## 2012-12-11 ENCOUNTER — Ambulatory Visit
Admission: RE | Admit: 2012-12-11 | Discharge: 2012-12-11 | Disposition: A | Discharge status: Home / Self Care | Payer: Medicare Other | Source: Ambulatory Visit | Attending: Radiation Oncology | Admitting: Radiation Oncology

## 2012-12-11 VITALS — BP 125/78 | HR 77 | Temp 98.5°F | Resp 20 | Wt 182.6 lb

## 2012-12-11 DIAGNOSIS — C099 Malignant neoplasm of tonsil, unspecified: Secondary | ICD-10-CM

## 2012-12-11 NOTE — Addendum Note (Signed)
Encounter addended by: Glennie Hawk, RN on: 12/11/2012  2:49 PM<BR>     Documentation filed: Inpatient Document Flowsheet

## 2012-12-11 NOTE — Progress Notes (Signed)
   Weekly Management Note: Outpatient Current Dose:  42 Gy  Projected Dose: 70 Gy   Narrative:  The patient presents for routine under treatment assessment.  CBCT/MVCT images/Port film x-rays were reviewed.  The chart was checked. Throat sore, hesitates to use PEG due to nausea with bolus feeding.  4 cans boost PO daily at most.  MMW, duragesic, hycet help pain. Hard to speak today.  Physical Findings:  weight is 182 lb 9.6 oz (82.827 kg). His temperature is 98.5 F (36.9 C). His blood pressure is 125/78 and his pulse is 77. His respiration is 20.  Hoarse.  No tumor visible in oropharynx. Confluent mucositis. No thrush. Skin intact.  CBC    Component Value Date/Time   WBC 4.4 12/04/2012 0814   WBC 6.8 11/07/2012 0745   RBC 4.32 12/04/2012 0814   RBC 5.00 11/07/2012 0745   HGB 13.3 12/04/2012 0814   HGB 15.2 11/07/2012 0745   HCT 37.6* 12/04/2012 0814   HCT 44.9 11/07/2012 0745   PLT 202 12/04/2012 0814   PLT 198 11/07/2012 0745   MCV 87.0 12/04/2012 0814   MCV 89.8 11/07/2012 0745   MCH 30.8 12/04/2012 0814   MCH 30.4 11/07/2012 0745   MCHC 35.4 12/04/2012 0814   MCHC 33.9 11/07/2012 0745   RDW 13.2 12/04/2012 0814   RDW 13.1 11/07/2012 0745   LYMPHSABS 0.5* 12/04/2012 0814   LYMPHSABS 1.0 05/01/2008 1947   MONOABS 0.6 12/04/2012 0814   MONOABS 0.7 05/01/2008 1947   EOSABS 0.0 12/04/2012 0814   EOSABS 0.1 05/01/2008 1947   BASOSABS 0.0 12/04/2012 0814   BASOSABS 0.1 05/01/2008 1947    CMP     Component Value Date/Time   NA 124* 12/04/2012 0814   NA 132* 09/28/2012 0916   K 5.2* 12/04/2012 0814   K 3.7 09/28/2012 0916   CL 90* 12/04/2012 0814   CL 91* 09/28/2012 0916   CO2 23 12/04/2012 0814   CO2 30 09/28/2012 0916   GLUCOSE 123* 12/04/2012 0814   GLUCOSE 116* 09/28/2012 0916   BUN 21.8 12/04/2012 0814   BUN 18 09/28/2012 0916   CREATININE 1.2 12/04/2012 0814   CREATININE 1.2 09/28/2012 0916   CALCIUM 9.9 12/04/2012 0814   CALCIUM 9.2 09/28/2012 0916   PROT 7.3 12/04/2012 0814   PROT 7.5 06/23/2012 1037   ALBUMIN 3.6  12/04/2012 0814   ALBUMIN 3.9 06/23/2012 1037   AST 17 12/04/2012 0814   AST 24 06/23/2012 1037   ALT 25 12/04/2012 0814   ALT 35 06/23/2012 1037   ALKPHOS 72 12/04/2012 0814   ALKPHOS 53 06/23/2012 1037   BILITOT 0.36 12/04/2012 0814   BILITOT 0.4 06/23/2012 1037   GFRNONAA >60 05/01/2008 1947   GFRAA  Value: >60        The eGFR has been calculated using the MDRD equation. This calculation has not been validated in all clinical 05/01/2008 1947      Impression:  The patient is tolerating radiotherapy.  Plan:  Continue radiotherapy as planned. Suggested decreasing size of PEG feedings for better toleration.   ________________________________   Lonie Peak, M.D.

## 2012-12-11 NOTE — Progress Notes (Signed)
Pt c/o painful swallowing; he states "it is no better". Hycet and MMW are helpful. Pt is drinking fluids but not eating solid foods. He tries to drink Boost 4 cans daily, not able to every day. Pt has seen dietician.  Pt states Carafate was not helpful at all w/swallowing. Wearing Duragesic patch. Pt applying Radiaplex to skin of lower face/neck in tx area for tanning and hyperpigmentation, no desquamation noted today.

## 2012-12-12 ENCOUNTER — Other Ambulatory Visit: Payer: Self-pay | Admitting: Oncology

## 2012-12-12 ENCOUNTER — Ambulatory Visit
Admission: RE | Admit: 2012-12-12 | Discharge: 2012-12-12 | Disposition: A | Discharge status: Home / Self Care | Payer: Medicare Other | Source: Ambulatory Visit | Attending: Radiation Oncology | Admitting: Radiation Oncology

## 2012-12-12 DIAGNOSIS — C099 Malignant neoplasm of tonsil, unspecified: Secondary | ICD-10-CM

## 2012-12-13 ENCOUNTER — Encounter (HOSPITAL_COMMUNITY): Payer: Self-pay

## 2012-12-13 ENCOUNTER — Ambulatory Visit
Admission: RE | Admit: 2012-12-13 | Discharge: 2012-12-13 | Disposition: A | Discharge status: Home / Self Care | Payer: Medicare Other | Source: Ambulatory Visit | Attending: Radiation Oncology | Admitting: Radiation Oncology

## 2012-12-13 ENCOUNTER — Other Ambulatory Visit: Payer: Self-pay | Admitting: Oncology

## 2012-12-13 ENCOUNTER — Other Ambulatory Visit (HOSPITAL_BASED_OUTPATIENT_CLINIC_OR_DEPARTMENT_OTHER): Payer: Medicare Other | Admitting: Lab

## 2012-12-13 ENCOUNTER — Inpatient Hospital Stay (HOSPITAL_COMMUNITY)
Admission: AD | Admit: 2012-12-13 | Discharge: 2012-12-16 | DRG: 683 | Disposition: A | Discharge status: Home / Self Care | Payer: Medicare Other | Source: Ambulatory Visit | Attending: Oncology | Admitting: Oncology

## 2012-12-13 ENCOUNTER — Encounter: Payer: Self-pay | Admitting: Oncology

## 2012-12-13 ENCOUNTER — Ambulatory Visit (HOSPITAL_BASED_OUTPATIENT_CLINIC_OR_DEPARTMENT_OTHER): Payer: Medicare Other | Admitting: Oncology

## 2012-12-13 VITALS — BP 112/68 | HR 82 | Temp 97.2°F | Resp 20 | Ht 69.0 in | Wt 180.2 lb

## 2012-12-13 DIAGNOSIS — C099 Malignant neoplasm of tonsil, unspecified: Secondary | ICD-10-CM | POA: Diagnosis present

## 2012-12-13 DIAGNOSIS — Z923 Personal history of irradiation: Secondary | ICD-10-CM

## 2012-12-13 DIAGNOSIS — K219 Gastro-esophageal reflux disease without esophagitis: Secondary | ICD-10-CM | POA: Diagnosis present

## 2012-12-13 DIAGNOSIS — K1231 Oral mucositis (ulcerative) due to antineoplastic therapy: Secondary | ICD-10-CM | POA: Diagnosis present

## 2012-12-13 DIAGNOSIS — Z9221 Personal history of antineoplastic chemotherapy: Secondary | ICD-10-CM

## 2012-12-13 DIAGNOSIS — C779 Secondary and unspecified malignant neoplasm of lymph node, unspecified: Secondary | ICD-10-CM | POA: Diagnosis present

## 2012-12-13 DIAGNOSIS — Z79899 Other long term (current) drug therapy: Secondary | ICD-10-CM

## 2012-12-13 DIAGNOSIS — E782 Mixed hyperlipidemia: Secondary | ICD-10-CM | POA: Diagnosis present

## 2012-12-13 DIAGNOSIS — N179 Acute kidney failure, unspecified: Principal | ICD-10-CM | POA: Diagnosis present

## 2012-12-13 DIAGNOSIS — K121 Other forms of stomatitis: Secondary | ICD-10-CM

## 2012-12-13 DIAGNOSIS — R112 Nausea with vomiting, unspecified: Secondary | ICD-10-CM | POA: Diagnosis present

## 2012-12-13 DIAGNOSIS — E871 Hypo-osmolality and hyponatremia: Secondary | ICD-10-CM | POA: Diagnosis present

## 2012-12-13 DIAGNOSIS — D6481 Anemia due to antineoplastic chemotherapy: Secondary | ICD-10-CM | POA: Diagnosis present

## 2012-12-13 DIAGNOSIS — K1230 Oral mucositis (ulcerative), unspecified: Secondary | ICD-10-CM

## 2012-12-13 DIAGNOSIS — Z931 Gastrostomy status: Secondary | ICD-10-CM

## 2012-12-13 DIAGNOSIS — E440 Moderate protein-calorie malnutrition: Secondary | ICD-10-CM | POA: Diagnosis present

## 2012-12-13 DIAGNOSIS — E860 Dehydration: Secondary | ICD-10-CM

## 2012-12-13 DIAGNOSIS — T451X5A Adverse effect of antineoplastic and immunosuppressive drugs, initial encounter: Secondary | ICD-10-CM | POA: Diagnosis present

## 2012-12-13 DIAGNOSIS — I10 Essential (primary) hypertension: Secondary | ICD-10-CM | POA: Diagnosis present

## 2012-12-13 LAB — COMPREHENSIVE METABOLIC PANEL (CC13)
Albumin: 3.2 g/dL — ABNORMAL LOW (ref 3.5–5.0)
Alkaline Phosphatase: 74 U/L (ref 40–150)
BUN: 81.7 mg/dL — ABNORMAL HIGH (ref 7.0–26.0)
CO2: 27 mEq/L (ref 22–29)
Glucose: 189 mg/dl — ABNORMAL HIGH (ref 70–99)
Sodium: 128 mEq/L — ABNORMAL LOW (ref 136–145)
Total Bilirubin: 0.58 mg/dL (ref 0.20–1.20)
Total Protein: 7.1 g/dL (ref 6.4–8.3)

## 2012-12-13 LAB — CBC WITH DIFFERENTIAL/PLATELET
Basophils Absolute: 0 10*3/uL (ref 0.0–0.1)
EOS%: 0 % (ref 0.0–7.0)
Eosinophils Absolute: 0 10*3/uL (ref 0.0–0.5)
HCT: 35.5 % — ABNORMAL LOW (ref 38.4–49.9)
HGB: 12.3 g/dL — ABNORMAL LOW (ref 13.0–17.1)
MCH: 30.6 pg (ref 27.2–33.4)
MCV: 88.3 fL (ref 79.3–98.0)
MONO%: 3 % (ref 0.0–14.0)
NEUT#: 6.2 10*3/uL (ref 1.5–6.5)
NEUT%: 92.9 % — ABNORMAL HIGH (ref 39.0–75.0)
Platelets: 120 10*3/uL — ABNORMAL LOW (ref 140–400)

## 2012-12-13 MED ORDER — JEVITY 1.2 CAL PO LIQD
237.0000 mL | Freq: Four times a day (QID) | ORAL | Status: DC
  Administered 2012-12-13 – 2012-12-15 (×6): 237 mL
  Filled 2012-12-13 (×9): qty 237

## 2012-12-13 MED ORDER — METOPROLOL SUCCINATE ER 50 MG PO TB24
75.0000 mg | ORAL_TABLET | Freq: Every day | ORAL | Status: DC
  Administered 2012-12-14 – 2012-12-16 (×3): 75 mg via ORAL
  Filled 2012-12-13 (×4): qty 1

## 2012-12-13 MED ORDER — HYDROCODONE-ACETAMINOPHEN 7.5-325 MG/15ML PO SOLN
15.0000 mL | Freq: Four times a day (QID) | ORAL | Status: DC | PRN
  Administered 2012-12-13 – 2012-12-14 (×2): 15 mL via ORAL
  Filled 2012-12-13 (×2): qty 15

## 2012-12-13 MED ORDER — BIOTENE DRY MOUTH MT LIQD: 15.0000 mL | Freq: Two times a day (BID) | OROMUCOSAL | Status: DC

## 2012-12-13 MED ORDER — ONDANSETRON HCL 4 MG/2ML IJ SOLN
4.0000 mg | Freq: Four times a day (QID) | INTRAMUSCULAR | Status: DC | PRN
  Administered 2012-12-13 – 2012-12-15 (×4): 4 mg via INTRAVENOUS
  Filled 2012-12-13 (×4): qty 2

## 2012-12-13 MED ORDER — SIMVASTATIN 20 MG PO TABS
20.0000 mg | ORAL_TABLET | Freq: Every day | ORAL | Status: DC
  Administered 2012-12-13 – 2012-12-14 (×2): 20 mg via ORAL
  Filled 2012-12-13 (×4): qty 1

## 2012-12-13 MED ORDER — ONDANSETRON HCL 4 MG PO TABS: 4.0000 mg | ORAL_TABLET | Freq: Four times a day (QID) | ORAL | Status: DC | PRN

## 2012-12-13 MED ORDER — ZOLPIDEM TARTRATE 5 MG PO TABS: 5.0000 mg | ORAL_TABLET | Freq: Every evening | ORAL | Status: DC | PRN

## 2012-12-13 MED ORDER — VITAMIN D3 25 MCG (1000 UNIT) PO TABS
1000.0000 [IU] | ORAL_TABLET | Freq: Every day | ORAL | Status: DC
  Administered 2012-12-14 – 2012-12-16 (×3): 1000 [IU] via ORAL
  Filled 2012-12-13 (×3): qty 1

## 2012-12-13 MED ORDER — LORAZEPAM 0.5 MG PO TABS: 0.5000 mg | ORAL_TABLET | Freq: Four times a day (QID) | ORAL | Status: DC | PRN

## 2012-12-13 MED ORDER — CHLORHEXIDINE GLUCONATE 0.12 % MT SOLN
15.0000 mL | Freq: Two times a day (BID) | OROMUCOSAL | Status: DC
  Administered 2012-12-13: 15 mL via OROMUCOSAL
  Filled 2012-12-13 (×2): qty 15

## 2012-12-13 MED ORDER — FENTANYL 25 MCG/HR TD PT72
25.0000 ug | MEDICATED_PATCH | TRANSDERMAL | Status: DC
  Administered 2012-12-13: 25 ug via TRANSDERMAL
  Filled 2012-12-13: qty 1

## 2012-12-13 MED ORDER — FENTANYL 25 MCG/HR TD PT72: 1.0000 | MEDICATED_PATCH | TRANSDERMAL | Status: DC

## 2012-12-13 MED ORDER — TAMSULOSIN HCL 0.4 MG PO CAPS
0.4000 mg | ORAL_CAPSULE | Freq: Every day | ORAL | Status: DC
  Administered 2012-12-14 – 2012-12-16 (×3): 0.4 mg via ORAL
  Filled 2012-12-13 (×3): qty 1

## 2012-12-13 MED ORDER — PROCHLORPERAZINE MALEATE 10 MG PO TABS: 10.0000 mg | ORAL_TABLET | Freq: Four times a day (QID) | ORAL | Status: DC | PRN

## 2012-12-13 MED ORDER — PANTOPRAZOLE SODIUM 40 MG PO TBEC
40.0000 mg | DELAYED_RELEASE_TABLET | Freq: Every day | ORAL | Status: DC
  Administered 2012-12-14 – 2012-12-16 (×3): 40 mg via ORAL
  Filled 2012-12-13 (×3): qty 1

## 2012-12-13 MED ORDER — LORAZEPAM 0.5 MG PO TABS
0.5000 mg | ORAL_TABLET | Freq: Four times a day (QID) | ORAL | Status: DC | PRN
  Administered 2012-12-15: 0.5 mg via ORAL
  Filled 2012-12-13 (×2): qty 1

## 2012-12-13 MED ORDER — MORPHINE SULFATE 2 MG/ML IJ SOLN: 2.0000 mg | INTRAMUSCULAR | Status: DC | PRN

## 2012-12-13 MED ORDER — SODIUM CHLORIDE 0.9 % IV SOLN
INTRAVENOUS | Status: DC
  Administered 2012-12-13 – 2012-12-16 (×6): via INTRAVENOUS

## 2012-12-13 MED ORDER — AMLODIPINE BESYLATE 5 MG PO TABS
5.0000 mg | ORAL_TABLET | Freq: Every day | ORAL | Status: DC
  Administered 2012-12-14 – 2012-12-16 (×3): 5 mg via ORAL
  Filled 2012-12-13 (×4): qty 1

## 2012-12-13 MED ORDER — SODIUM FLUORIDE 1.1 % DT GEL
Freq: Every day | DENTAL | Status: DC
  Administered 2012-12-14: 21:00:00 via DENTAL

## 2012-12-13 MED ORDER — FREE WATER
60.0000 mL | Freq: Four times a day (QID) | Status: DC
  Administered 2012-12-13 – 2012-12-16 (×10): 60 mL

## 2012-12-13 MED ORDER — MAGIC MOUTHWASH W/LIDOCAINE
5.0000 mL | Freq: Four times a day (QID) | ORAL | Status: DC | PRN
  Administered 2012-12-14: 5 mL via ORAL
  Filled 2012-12-13: qty 5

## 2012-12-13 MED ORDER — DOCUSATE SODIUM 100 MG PO CAPS
100.0000 mg | ORAL_CAPSULE | Freq: Two times a day (BID) | ORAL | Status: DC
  Administered 2012-12-13 – 2012-12-16 (×5): 100 mg via ORAL
  Filled 2012-12-13 (×7): qty 1

## 2012-12-13 MED ORDER — ONDANSETRON HCL 8 MG PO TABS: 8.0000 mg | ORAL_TABLET | Freq: Two times a day (BID) | ORAL | Status: DC | PRN

## 2012-12-13 MED ORDER — ASPIRIN EC 81 MG PO TBEC
81.0000 mg | DELAYED_RELEASE_TABLET | Freq: Every evening | ORAL | Status: DC
  Administered 2012-12-13 – 2012-12-15 (×3): 81 mg via ORAL
  Filled 2012-12-13 (×4): qty 1

## 2012-12-13 MED ORDER — BOOST PLUS PO LIQD
237.0000 mL | Freq: Three times a day (TID) | ORAL | Status: DC
  Administered 2012-12-13 – 2012-12-15 (×5): 237 mL via ORAL
  Filled 2012-12-13 (×12): qty 237

## 2012-12-13 MED ORDER — HEPARIN SODIUM (PORCINE) 5000 UNIT/ML IJ SOLN
5000.0000 [IU] | Freq: Three times a day (TID) | INTRAMUSCULAR | Status: DC
  Administered 2012-12-13 – 2012-12-16 (×8): 5000 [IU] via SUBCUTANEOUS
  Filled 2012-12-13 (×11): qty 1

## 2012-12-13 NOTE — Progress Notes (Addendum)
Pt admitted due to acute renal failure. See H&P dated same date.

## 2012-12-13 NOTE — Progress Notes (Signed)
INITIAL NUTRITION ASSESSMENT  Pt meets criteria for severe MALNUTRITION in the context of chronic illness as evidenced by <75% estimated energy intake with 7.2% weight loss in the past month.  DOCUMENTATION CODES Per approved criteria  -Severe malnutrition in the context of chronic illness   INTERVENTION: - Boost Plus QID (will provide a total of 1440 calories, 56g protein)  - Jevity 1.2 4 cans day which will provide 1140 calories, 53g protein, free water - will order 60ml free water before and after each bolus TF administration - Combination of Boost Plus with Jevity 1.2 QID will provide 2580 calories, 109g protein meeting 117% estimated calorie needs, 95% estimated protein needs - Will initiate adult enteral protocol - Will continue to monitor   NUTRITION DIAGNOSIS: Inadequate oral intake related to mucositis, tonsil CA on chemoradiation as evidenced by unintended weight loss.   Goal: 1. Pt to consume >90% of meals/supplements 2. TF tolerance  Monitor:  Weights, labs, intake, TF tolerance   Reason for Assessment: Consult   72 y.o. 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 Pt followed by [facility] RD. Pt has been advised to either consume 6 Boost Plus/day or 8 cans of Jevity 1.2 via PEG to meet nutritional needs. Pt has been consuming 2-3 Boost Plus/day at home (each provides 360 calories, 14g protein). Pt reports he has not been consuming much else and only used the PEG tube 1 time since it was placed. Pt's weight has dropped 14 pounds in the past month. Pt with mucositis and c/o pain with swallowing however states he can consume the Boost without difficulty. Pt c/o being tired at home. Pt reports having occasional nausea and states he had some vomiting 2 days ago after taking his hydrocodone.   Height: Ht Readings from Last 1 Encounters:  12/13/12  5\' 9"  (1.753 m)    Weight: Wt Readings from Last 1 Encounters:  12/13/12 180 lb (81.647 kg)    Ideal Body Weight: 160 lb  % Ideal Body Weight: 112  Wt Readings from Last 10 Encounters:  12/13/12 180 lb (81.647 kg)  12/13/12 180 lb 3.2 oz (81.738 kg)  12/11/12 182 lb 9.6 oz (82.827 kg)  12/05/12 185 lb 11.2 oz (84.233 kg)  12/04/12 185 lb 6.4 oz (84.097 kg)  11/27/12 188 lb 6.4 oz (85.458 kg)  11/24/12 188 lb 6.4 oz (85.458 kg)  11/22/12 187 lb (84.823 kg)  11/20/12 187 lb 3.2 oz (84.913 kg)  11/13/12 194 lb 6.4 oz (88.179 kg)    Usual Body Weight: 190 lb in October 2013  % Usual Body Weight: 95  BMI:  Body mass index is 26.57 kg/(m^2).  Estimated Nutritional Needs: Kcal: 2200-2400 Protein: 115-130g Fluid: 2.4L/day  Skin: Intact   Diet Order: General  EDUCATION NEEDS: -No education needs identified at this time  No intake or output data in the 24 hours ending 12/13/12 1559  Last BM: PTA  Labs:   Recent Labs Lab 12/13/12 0952  NA 128*  K 5.2*  CL 88*  CO2 27  BUN 81.7*  CREATININE 5.6 Repeated and Verified*  CALCIUM 9.4  GLUCOSE 189*    CBG (last 3)  No results found for this basename: GLUCAP,  in the last 72 hours  Scheduled Meds: . [START ON 12/14/2012] amLODipine  5 mg Oral QAC breakfast  . aspirin EC  81 mg Oral QPM  . [  START ON 12/14/2012] cholecalciferol  1,000 Units Oral Daily  . docusate sodium  100 mg Oral BID  . fentaNYL  25 mcg Transdermal Q72H  . heparin  5,000 Units Subcutaneous Q8H  . [START ON 12/14/2012] metoprolol succinate  75 mg Oral QAC breakfast  . [START ON 12/14/2012] pantoprazole  40 mg Oral Daily  . simvastatin  20 mg Oral QHS  . sodium fluoride   dental QHS  . [START ON 12/14/2012] tamsulosin  0.4 mg Oral Daily    Continuous Infusions: . sodium chloride 150 mL/hr at 12/13/12 1535    Past Medical History  Diagnosis Date  . Essential hypertension, benign   . Mixed hyperlipidemia   . Unspecified sinusitis  (chronic)   . Edema   . Personal history of adenomatous colonic polyps 07/23/2012    2010  . Hearing loss   . Hx of prostatitis   . 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 Anxiety     mild new dx  . GERD (gastroesophageal reflux disease)   . Neuromuscular disorder     b/l tremors,   . Basal cell carcinoma   . Mucositis 12/04/2012    Past Surgical History  Procedure Laterality Date  . Colonoscopy  2011    polyps. Dr. Leone Payor.  Due now 2013  . Biopsy of right tonsil Right 10/03/2012    Squamous Cell Carcinoma     . Blepharoplasty      Levon Hedger MS, RD, LDN [phone] Pager [phone] After Hours Pager

## 2012-12-13 NOTE — Addendum Note (Signed)
Addended by: Myrtis Ser on: 12/13/2012 03:58 PM   Modules accepted: Level of Service

## 2012-12-13 NOTE — Progress Notes (Signed)
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  PAST THERAPY: Biopsy only.   CURRENT THERAPY: Started concurrent chemo with q3 weeks Cisplatin and daily radiation on 11/13/2012.   REASON FOR ADMISSION: Acute renal failure, dehydration, nausea/vomiting.  INTERVAL HISTORY: Omar Sawyer 72 y.o. male returns for routine visit today with his wife. He was seen by me earlier today, but was found to have a BUN 81 of and creatinine of 5.6 after he had already left our office. He was asked to come back to arrange for admission to the hospital. He has developed worsening hearing capacity in the right ear. Of note he had hearing aid to begin with. He denied tinnitus. He had some nausea with chemotherapy and vomited about 2 days ago. Using antiemetics routinely. He has decreased stamina with chemotherapy. He was able to ambulate however much less active compared to prior to chemotherapy. He has mucositis and has been taking hydrocodone liquid elixir about 3-4 times per day for his mucositis pain. He has a Fentanyl patch at 12 mcg/hr. States that he is doing swallowing exercises. His PEG tube has been fine without discharge or pain. Due to his mucositis pain, he has decreased oral intake. He only drinks boost, water, or liquid food. Therefore he is having weight loss. He has not been putting any nutrition through his PEG despite education about this from [place] and Dietician. He continues to drink water well per his wife though. The rest of the 14 point review of system was negative.   Past Medical History  Diagnosis Date  . Essential hypertension, benign   . Mixed hyperlipidemia   . Unspecified sinusitis (chronic)   . Edema   . Personal history of adenomatous colonic polyps 07/23/2012    2010  . Hearing loss   . Hx of prostatitis   . Metastasis to lymph  nodes Pet Scan 10/26/12    Right Level IIa abd Level III Lymph Nodes  . 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 Anxiety     mild new dx  . GERD (gastroesophageal reflux disease)   . Neuromuscular disorder     b/l tremors,   . Basal cell carcinoma   . Mucositis 12/04/2012    Past Surgical History  Procedure Laterality Date  . Colonoscopy  2011    polyps. Dr. Leone Payor.  Due now 2013  . Biopsy of right tonsil Right 10/03/2012    Squamous Cell Carcinoma     . Blepharoplasty      No current facility-administered medications for this encounter.   Current Outpatient Prescriptions  Medication Sig Dispense Refill  . Alum & Mag Hydroxide-Simeth (MAGIC MOUTHWASH W/LIDOCAINE) SOLN 1part nystatin,1part Maaloxplus,1part benadryl,3parts 2%viscous lidocaine. Swish/swallow up to QID, 30 min before meals/bedtime  480 mL  5  . amLODipine (NORVASC) 5 MG tablet Take 5 mg by mouth daily before breakfast.       . aspirin EC 81 MG tablet Take 81 mg by mouth every evening.       . cholecalciferol (VITAMIN D) 1000 UNITS tablet Take 1,000 Units by mouth daily.      . fentaNYL (DURAGESIC - DOSED MCG/HR) 25 MCG/HR Place 1 patch (25 mcg total) onto the skin every 3 (three) days.  10 patch  0  .  fish oil-omega-3 fatty acids 1000 MG capsule Take 1 g by mouth daily.       . hydrochlorothiazide (HYDRODIURIL) 25 MG tablet Take 25 mg by mouth every other day.       Marland Kitchen HYDROcodone-acetaminophen (HYCET) 7.5-325 mg/15 ml solution Take 15 mLs by mouth every 4 (four) hours as needed for pain.  473 mL  3  . lisinopril (PRINIVIL,ZESTRIL) 20 MG tablet Take 20 mg by mouth every evening.       Marland Kitchen LORazepam (ATIVAN) 0.5 MG tablet Take 1 tablet (0.5 mg total) by mouth every 6 (six) hours as needed (anticipation nausea or vomiting).  30 tablet  1  . metoprolol succinate (TOPROL-XL) 50 MG 24 hr tablet Take 75 mg by mouth daily before breakfast.       . omeprazole  (PRILOSEC) 20 MG capsule Take 20 mg by mouth daily.       . ondansetron (ZOFRAN) 8 MG tablet Take 1 tablet (8 mg total) by mouth every 12 (twelve) hours as needed for nausea. Take two times a day as needed for nausea or vomiting starting on the third day after chemotherapy.  30 tablet  1  . potassium chloride (K-DUR) 10 MEQ tablet Take 1 tablet (10 mEq total) by mouth daily.  30 tablet  6  . prochlorperazine (COMPAZINE) 10 MG tablet Take 1 tablet (10 mg total) by mouth every 6 (six) hours as needed (Nausea or vomiting).  30 tablet  1  . simvastatin (ZOCOR) 20 MG tablet Take 1 tablet by mouth at bedtime.       . sodium fluoride (FLUORISHIELD) 1.1 % GEL dental gel Brush and floss. Instill one drop of fluoride into fluoride tray. Place over teeth for 5 minutes. Remove. Spit out excess. Repeat nightly  120 mL  prn  . Tamsulosin HCl (FLOMAX) 0.4 MG CAPS Take 0.4 mg by mouth daily.         ALLERGIES:  has No Known Allergies.  REVIEW OF SYSTEMS:  The rest of the 14-point review of system was negative.   There were no vitals filed for this visit. Wt Readings from Last 3 Encounters:  12/13/12 180 lb 3.2 oz (81.738 kg)  12/11/12 182 lb 9.6 oz (82.827 kg)  12/05/12 185 lb 11.2 oz (84.233 kg)   ECOG Performance status: 1  PHYSICAL EXAMINATION:   General: well-nourished man, in no acute distress. Eyes: no scleral icterus. ENT: There were no oropharyngeal lesions. Neck was without thyromegaly. Lymphatics: Negative cervical, supraclavicular or axillary adenopathy. Respiratory: lungs were clear bilaterally without wheezing or crackles. Cardiovascular: Regular rate and rhythm, S1/S2, without murmur, rub or gallop. There was no pedal edema. GI: abdomen was soft, flat, nontender, nondistended, without organomegaly. PEG tube showed mild erythema at the stopper site due to tight fitting but not purulent discharge. Muscoloskeletal: no spinal tenderness of palpation of vertebral spine. Skin exam was without  echymosis, petichae. Neuro exam was nonfocal. Patient was able to get on and off exam table without assistance. Gait was normal. Patient was alerted and oriented. Attention was good. Language was appropriate. Mood was normal without depression. Speech was not pressured. Thought content was not tangential.    LABORATORY/RADIOLOGY DATA:  Lab Results  Component Value Date   WBC 6.7 12/13/2012   HGB 12.3* 12/13/2012   HCT 35.5* 12/13/2012   PLT 120* 12/13/2012   GLUCOSE 189* 12/13/2012   CHOL 112 06/23/2012   TRIG 103.0 06/23/2012   HDL 33.60* 06/23/2012   LDLCALC 58 06/23/2012  ALKPHOS 74 12/13/2012   ALT 31 12/13/2012   AST 17 12/13/2012   NA 128* 12/13/2012   K 5.2* 12/13/2012   CL 88* 12/13/2012   CREATININE 5.6 Repeated and Verified* 12/13/2012   BUN 81.7* 12/13/2012   CO2 27 12/13/2012   INR 1.09 11/07/2012    ASSESSMENT AND PLAN:   1. Acute renal failure; Due to dehydration and Cisplatin: Will admit to the hospital for hydration. Monitor BMET daily. No additional Cisplatin will be given.  We will obtain a renal ultrasound to ensure no hydronephrosis. If his renal function worsens despite IV fluid, we will consider nephrology consultation.  2. Dehydration: IVF as noted above.  3. Nausea/vomiting; due to Cisplatin: Will continue Zofran, Ativan, and Compazine.  4. Mucositis: Continue salt/baking soda rinses. Continue Fentanyl 25 mcg/hr patch and Hydrocodone elixir for breakthrough pain.  5. Anemia; Due to chemo. No active bleeding noted. No transfusion is indicated.  6. Protein calorie malnutrition: He has had weight loss on treatment. Recommend Dietician consult. He may continue to take foods and liquids by mouth as he tolerates. Recommend that he begin tube feedings to maintain nutrition and hydration.   Full code.  ======================  ATTENDING'S ATTESTATION:  I personally reviewed patient's chart, examined patient myself, formulated the treatment plan as followed.

## 2012-12-13 NOTE — Progress Notes (Deleted)
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   PAST THERAPY:  Biopsy only.   CURRENT THERAPY:  Started concurrent chemo with q3 weeks Cisplatin and daily radiation on 11/13/2012.   INTERVAL HISTORY: Omar Sawyer 71 y.o. male returns for regular follow up with his wife.  He develops worsening hearing capacity in the right ear. Of note he had hearing aid to begin with. He denied tinnitus. He had some nausea with chemotherapy and vomited about 2 days ago. Using antiemetics routinely. He has decreased stamina with chemotherapy. He was able to ambulate however much less active compared to prior to chemotherapy. He has mucositis and has been taking hydrocodone liquid elixir about 3-4 times per day for his mucositis pain. He has a Fentanyl patch at 12 mcg/hr. States that he is doing swallowing exercises. His PEG tube has been fine without discharge or pain. Due to his mucositis pain, he has decreased oral intake. He only drinks boost or liquid food. Therefore he is having weight loss. He has not been putting any nutrition through his PEG despite education about this from [place] and Dietician. The rest of the 14 point review of system was negative.  Past Medical History  Diagnosis Date  . Essential hypertension, benign   . Mixed hyperlipidemia   . Unspecified sinusitis (chronic)   . Edema   . Personal history of adenomatous colonic polyps 07/23/2012    2010  . Hearing loss   . Hx of prostatitis   . 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 Anxiety     mild new dx  . GERD (gastroesophageal reflux disease)   . Neuromuscular disorder     b/l tremors,   . Basal cell carcinoma   . Mucositis 12/04/2012     Past Surgical History  Procedure Laterality Date  . Colonoscopy  2011    polyps. Dr. Leone Payor.  Due now 2013  . Biopsy of right tonsil Right 10/03/2012    Squamous Cell Carcinoma     . Blepharoplasty      Current Outpatient Prescriptions  Medication Sig Dispense Refill  . Alum & Mag Hydroxide-Simeth (MAGIC MOUTHWASH W/LIDOCAINE) SOLN 1part nystatin,1part Maaloxplus,1part benadryl,3parts 2%viscous lidocaine. Swish/swallow up to QID, 30 min before meals/bedtime  480 mL  5  . amLODipine (NORVASC) 5 MG tablet Take 5 mg by mouth daily before breakfast.       . aspirin EC 81 MG tablet Take 81 mg by mouth every evening.       . cholecalciferol (VITAMIN D) 1000 UNITS tablet Take 1,000 Units by mouth daily.      . fentaNYL (DURAGESIC - DOSED MCG/HR) 25 MCG/HR Place 1 patch (25 mcg total) onto the skin every 3 (three) days.  10 patch  0  . fish oil-omega-3 fatty acids 1000 MG capsule Take 1 g by mouth daily.       . hydrochlorothiazide (HYDRODIURIL) 25 MG tablet Take 25 mg by mouth every other day.       Marland Kitchen HYDROcodone-acetaminophen (HYCET) 7.5-325 mg/15 ml solution Take 15 mLs by mouth every 4 (four) hours as needed for pain.  473 mL  3  . lisinopril (  PRINIVIL,ZESTRIL) 20 MG tablet Take 20 mg by mouth every evening.       Marland Kitchen LORazepam (ATIVAN) 0.5 MG tablet Take 1 tablet (0.5 mg total) by mouth every 6 (six) hours as needed (anticipation nausea or vomiting).  30 tablet  1  . metoprolol succinate (TOPROL-XL) 50 MG 24 hr tablet Take 75 mg by mouth daily before breakfast.       . omeprazole (PRILOSEC) 20 MG capsule Take 20 mg by mouth daily.       . ondansetron (ZOFRAN) 8 MG tablet Take 1 tablet (8 mg total) by mouth every 12 (twelve) hours as needed for nausea. Take two times a day as needed for nausea or vomiting starting on the third day after chemotherapy.  30 tablet  1  . potassium chloride (K-DUR) 10 MEQ tablet Take 1 tablet (10 mEq total) by mouth daily.  30 tablet  6  . prochlorperazine  (COMPAZINE) 10 MG tablet Take 1 tablet (10 mg total) by mouth every 6 (six) hours as needed (Nausea or vomiting).  30 tablet  1  . simvastatin (ZOCOR) 20 MG tablet Take 1 tablet by mouth at bedtime.       . sodium fluoride (FLUORISHIELD) 1.1 % GEL dental gel Brush and floss. Instill one drop of fluoride into fluoride tray. Place over teeth for 5 minutes. Remove. Spit out excess. Repeat nightly  120 mL  prn  . Tamsulosin HCl (FLOMAX) 0.4 MG CAPS Take 0.4 mg by mouth daily.        No current facility-administered medications for this visit.    ALLERGIES:  has No Known Allergies.   Filed Vitals:   12/13/12 1020  BP: 112/68  Pulse: 82  Temp: 97.2 F (36.2 C)  Resp: 20   Wt Readings from Last 3 Encounters:  12/13/12 180 lb 3.2 oz (81.738 kg)  12/11/12 182 lb 9.6 oz (82.827 kg)  12/05/12 185 lb 11.2 oz (84.233 kg)   ECOG Performance status: 1  PHYSICAL EXAMINATION:   General:  well-nourished man, in no acute distress.  Eyes:  no scleral icterus.  ENT:  There were no oropharyngeal lesions.  Neck was without thyromegaly.  Lymphatics:  Negative cervical, supraclavicular or axillary adenopathy.  Respiratory: lungs were clear bilaterally without wheezing or crackles.  Cardiovascular:  Regular rate and rhythm, S1/S2, without murmur, rub or gallop.  There was no pedal edema.  GI:  abdomen was soft, flat, nontender, nondistended, without organomegaly. PEG tube showed mild erythema at the stopper site due to tight fitting but not purulent discharge.  Muscoloskeletal:  no spinal tenderness of palpation of vertebral spine.  Skin exam was without echymosis, petichae.  Neuro exam was nonfocal.  Patient was able to get on and off exam table without assistance.  Gait was normal.  Patient was alerted and oriented.  Attention was good.   Language was appropriate.  Mood was normal without depression.  Speech was not pressured.  Thought content was not tangential.      LABORATORY/RADIOLOGY DATA:  Lab  Results  Component Value Date   WBC 6.7 12/13/2012   HGB 12.3* 12/13/2012   HCT 35.5* 12/13/2012   PLT 120* 12/13/2012   GLUCOSE 123* 12/04/2012   CHOL 112 06/23/2012   TRIG 103.0 06/23/2012   HDL 33.60* 06/23/2012   LDLCALC 58 06/23/2012   ALKPHOS 72 12/04/2012   ALT 25 12/04/2012   AST 17 12/04/2012   NA 124* 12/04/2012   K 5.2* 12/04/2012   CL 90* 12/04/2012  CREATININE 1.2 12/04/2012   BUN 21.8 12/04/2012   CO2 23 12/04/2012   INR 1.09 11/07/2012    ASSESSMENT AND PLAN:   1.  Diagnosis:  Head and neck cancer. 2.  Treatment:  Concurrent chemo radiation with every 3 weeks Cisplatin and daily radiation. 3.  Side effects: grade 2-3 right sided worsening hearing loss from baseline; grade 1-2 fatigue; grade 2 mucositis; grade 2 anorexia/weight loss.    4.  Treatment: He is s/p 2 doses of every 3 weeks Cisplatin. We will reassess him at the next visit prior to chemo. I have again reviewed with him that he needs to improve his nutrition and maintain his weight. We may need to consider stopping chemo versus using Carbo/Taxol due to hearing loss.   5.  Mucositis:  I have increased his Fentanyl to 25 mcg. Continue Hydrocodone elixir as needed for pain. 6.  To prevent esophageal stricture:  I advised him to perform swallowing exercises more routinely.  7.  Nausea/vomiting medications:  - Prochloroperzaine (Brand name:  Compazine):  10mg  by mouth, every 6 hours as needed.  - Odansetron (Brand name:  Zofran):  4-8 mg by mouth or under the tongue, every 8-12 hours as needed.  -  Lorazepam (Brand name:  Ativan):  0.5mg  by mouth or under the tongue, every 6 hours; especially before meal for anticipatory nausea and vomiting.   Follow up:  Prior to his third cycle of Cisplatin.l.       The length of time of the face-to-face encounter was 15 minutes. More than 50% of time was spent counseling and coordination of care.

## 2012-12-13 NOTE — Patient Instructions (Addendum)
Use two 12 mcg Fentanyl patches to until gone. When you have run out of 12 mcg patches, please begin using the Fentanyl 25 mcg patches. Continue to change every 72 hours (3 days).

## 2012-12-14 ENCOUNTER — Inpatient Hospital Stay (HOSPITAL_COMMUNITY): Payer: Medicare Other

## 2012-12-14 ENCOUNTER — Ambulatory Visit
Admission: RE | Admit: 2012-12-14 | Discharge: 2012-12-14 | Disposition: A | Discharge status: Home / Self Care | Payer: Medicare Other | Source: Ambulatory Visit | Attending: Radiation Oncology | Admitting: Radiation Oncology

## 2012-12-14 LAB — BASIC METABOLIC PANEL
CO2: 27 mEq/L (ref 19–32)
Calcium: 8.3 mg/dL — ABNORMAL LOW (ref 8.4–10.5)
Chloride: 88 mEq/L — ABNORMAL LOW (ref 96–112)
Creatinine, Ser: 4.1 mg/dL — ABNORMAL HIGH (ref 0.50–1.35)
GFR calc Af Amer: 15 mL/min — ABNORMAL LOW (ref >90)
Potassium: 4.4 mEq/L (ref 3.5–5.1)

## 2012-12-14 MED ORDER — VITAMINS A & D EX OINT
TOPICAL_OINTMENT | CUTANEOUS | Status: AC
  Administered 2012-12-14: 17:00:00
  Filled 2012-12-14: qty 5

## 2012-12-14 NOTE — Progress Notes (Signed)
[facility] Radiation Oncology Dept Therapy Treatment Record Phone [phone]   Radiation Therapy was administered to Omar Sawyer on: 12/14/2012  9:37 AM and was treatment # 24 out of a planned course of 35 treatments.

## 2012-12-14 NOTE — Progress Notes (Signed)
Patient evaluated for long-term disease management services with [facility] as a benefit of his [employer]. Spoke with patient and wife at bedside to explain services. Pleasantly declined [facility] Care Management services. Made them aware if they should change their mind in the future to call. Left contact information with wife.   Raiford Noble, MSN-Ed, RN,BSN, [facility], [phone]

## 2012-12-14 NOTE — Progress Notes (Signed)
Nutrition Brief Note  Pt currently asleep. Per RN pt was given 1 can of Jevity 1.2 last night via PEG, refused bolus TF this morning due to nausea, but got 1 can of Jevity 1.2 around lunchtime today which pt tolerated. RN reports has not had any Boost Plus today but did eat a few bites of fruit. RD to continue to monitor.   Levon Hedger MS, RD, LDN [phone] Pager [phone] After Hours Pager

## 2012-12-14 NOTE — Progress Notes (Signed)
[facility] INPATIENT PROGRESS NOTE  Name: Omar Sawyer      MRN: [id number]    Location: 1306/1306-01  Date: 12/14/2012 Time:9:53 AM   Subjective: Interval History:Omar Sawyer reported feeling slightly better. He still has mild to moderate mucositis from treatment. He has mild nausea without vomiting. He denies fever, shortness of breath, chest pain, abdominal pain, bleeding symptom. The rest of the 14 point review of system was negative.  Objective: Vital signs in last 24 hours: Temp:  [97.2 F (36.2 C)-98.8 F (37.1 C)] 98.8 F (37.1 C) (04/17 0520) Pulse Rate:  [73-82] 78 (04/17 0520) Resp:  [18-20] 18 (04/17 0520) BP: (112-130)/(59-68) 129/59 mmHg (04/17 0520) SpO2:  [98 %-100 %] 98 % (04/17 0520) Weight:  [177 lb 6.4 oz (80.468 kg)-180 lb 3.2 oz (81.738 kg)] 177 lb 6.4 oz (80.468 kg) (04/17 0557)     PHYSICAL EXAM:  Gen: Well-nourished man, in no acute distress. Eyes: No scleral icterus or jaundice. ENT: There was mild erythematous oropharynx from treatment without oral thrush. Neck was supple without thyromegaly. Lymphatics: Negative for cervical, supraclavicular, axillary, or inguinal adenopathy.  Respiratory: Lungs were clear bilaterally without wheezing or crackles. Cardiovascular: normal heart rate and rhythm; S1/S2; without murmur, rubs, or gallop. There was no pedal edema. GI: Abdomen was soft, flat, nontender, nondistended, without organomegaly. PEG tube was in place; it was dry, clean, intact without pain on palpation. Musculoskeletal exam: No spinal tenderness on palpation of vertebral spine. Skin exam was without ecchymosis, petechiae. Neuro exam was nonfocal. Patient was alert and oriented. Attention was good. Language was appropriate. Mood was normal without depression. Speech was not pressured. Thought content was not tangential.        Studies/Results: Results for orders placed during the hospital encounter of 12/13/12 (from the past 48 hour(s))   BASIC METABOLIC PANEL     Status: Abnormal   Collection Time    12/14/12  3:55 AM      Result Value Range   Sodium 124 (*) 135 - 145 mEq/L   Potassium 4.4  3.5 - 5.1 mEq/L   Chloride 88 (*) 96 - 112 mEq/L   CO2 27  19 - 32 mEq/L   Glucose, Bld 138 (*) 70 - 99 mg/dL   BUN 68 (*) 6 - 23 mg/dL   Creatinine, Ser 4.09 (*) 0.50 - 1.35 mg/dL   Calcium 8.3 (*) 8.4 - 10.5 mg/dL   GFR calc non Af Amer 13 (*) >90 mL/min   GFR calc Af Amer 15 (*) >90 mL/min   Comment:            The eGFR has been calculated     using the CKD EPI equation.     This calculation has not been     validated in all clinical     situations.     eGFR's persistently     <90 mL/min signify     possible Chronic Kidney Disease.   No results found.   MEDICATIONS: Reviewed   PROBLEM LIST:  1.  acute renal failure. 2.  head neck cancer. 3.  Hyponatremia due to dehydration or chemotherapy 4.  Mucositis 5.  chemotherapy-induced anemia 6.  moderate calorie protein malnutrition  PLAN: - his creatinine improves today with IV fluid. Continue IV fluid for now. There is no evidence of fluid overload on exam today.  Pending renal ultrasound to rule out hydronephrosis. If his renal function worsens, I may consider nephrology consultation. - He will  continue to receive radiation therapy while he is here.  - Disposition: Plan to discharge home for self-care when creatinine is approaching 2.   - I advised him to continue mouth rinse and mouthwash.  FULL CODE.

## 2012-12-15 ENCOUNTER — Ambulatory Visit
Admission: RE | Admit: 2012-12-15 | Discharge: 2012-12-15 | Disposition: A | Discharge status: Home / Self Care | Payer: Medicare Other | Source: Ambulatory Visit | Attending: Radiation Oncology | Admitting: Radiation Oncology

## 2012-12-15 DIAGNOSIS — C760 Malignant neoplasm of head, face and neck: Secondary | ICD-10-CM

## 2012-12-15 DIAGNOSIS — D6481 Anemia due to antineoplastic chemotherapy: Secondary | ICD-10-CM

## 2012-12-15 DIAGNOSIS — E860 Dehydration: Secondary | ICD-10-CM

## 2012-12-15 DIAGNOSIS — N179 Acute kidney failure, unspecified: Principal | ICD-10-CM

## 2012-12-15 LAB — BASIC METABOLIC PANEL
Calcium: 7.7 mg/dL — ABNORMAL LOW (ref 8.4–10.5)
GFR calc non Af Amer: 20 mL/min — ABNORMAL LOW (ref >90)
Glucose, Bld: 116 mg/dL — ABNORMAL HIGH (ref 70–99)
Potassium: 4.4 mEq/L (ref 3.5–5.1)
Sodium: 131 mEq/L — ABNORMAL LOW (ref 135–145)

## 2012-12-15 NOTE — Progress Notes (Signed)
[facility] INPATIENT PROGRESS NOTE  Name: Omar Sawyer      MRN: [id number]    Location: 1306/1306-01  Date: 12/15/2012 Time:12:06 PM   Subjective: Interval History:Omar Sawyer reported feeling OK.  He still has moderate mucositis with swallowing.  He only takes sip of water.  His nutrition is via PEG.  He denies fever, nausea vomiting, shortness of breath, chest pain, abdominal pain, bleeding symptoms..  Objective: Vital signs in last 24 hours: Temp:  [98.5 F (36.9 C)-99.1 F (37.3 C)] 98.6 F (37 C) (04/18 4098) Pulse Rate:  [75-85] 80 (04/18 0642) Resp:  [18-20] 18 (04/18 0642) BP: (125-148)/(55-59) 148/59 mmHg (04/18 0642) SpO2:  [97 %-99 %] 97 % (04/18 0642) Weight:  [176 lb (79.833 kg)] 176 lb (79.833 kg) (04/18 1191)     PHYSICAL EXAM:  Gen: Well-nourished man, in no acute distress. Eyes: No scleral icterus or jaundice. ENT: There was mild erythematous oropharynx from treatment without oral thrush. Neck was supple without thyromegaly. Lymphatics: Negative for cervical, supraclavicular, axillary, or inguinal adenopathy.  Respiratory: Lungs were clear bilaterally without wheezing or crackles. Cardiovascular: normal heart rate and rhythm; S1/S2; without murmur, rubs, or gallop. There was no pedal edema. GI: Abdomen was soft, flat, nontender, nondistended, without organomegaly. PEG tube was in place; it was dry, clean, intact without pain on palpation. Musculoskeletal exam: No spinal tenderness on palpation of vertebral spine. Skin exam was without ecchymosis, petechiae. Neuro exam was nonfocal. Patient was alert and oriented. Attention was good. Language was appropriate. Mood was normal without depression. Speech was not pressured. Thought content was not tangential.        Studies/Results: Results for orders placed during the hospital encounter of 12/13/12 (from the past 48 hour(s))  BASIC METABOLIC PANEL     Status: Abnormal   Collection Time    12/14/12   3:55 AM      Result Value Range   Sodium 124 (*) 135 - 145 mEq/L   Potassium 4.4  3.5 - 5.1 mEq/L   Chloride 88 (*) 96 - 112 mEq/L   CO2 27  19 - 32 mEq/L   Glucose, Bld 138 (*) 70 - 99 mg/dL   BUN 68 (*) 6 - 23 mg/dL   Creatinine, Ser 4.78 (*) 0.50 - 1.35 mg/dL   Calcium 8.3 (*) 8.4 - 10.5 mg/dL   GFR calc non Af Amer 13 (*) >90 mL/min   GFR calc Af Amer 15 (*) >90 mL/min   Comment:            The eGFR has been calculated     using the CKD EPI equation.     This calculation has not been     validated in all clinical     situations.     eGFR's persistently     <90 mL/min signify     possible Chronic Kidney Disease.  BASIC METABOLIC PANEL     Status: Abnormal   Collection Time    12/15/12  7:25 AM      Result Value Range   Sodium 131 (*) 135 - 145 mEq/L   Comment: DELTA CHECK NOTED     REPEATED TO VERIFY   Potassium 4.4  3.5 - 5.1 mEq/L   Chloride 96  96 - 112 mEq/L   Comment: DELTA CHECK NOTED     REPEATED TO VERIFY   CO2 25  19 - 32 mEq/L   Glucose, Bld 116 (*) 70 - 99 mg/dL  BUN 47 (*) 6 - 23 mg/dL   Creatinine, Ser 1.61 (*) 0.50 - 1.35 mg/dL   Calcium 7.7 (*) 8.4 - 10.5 mg/dL   GFR calc non Af Amer 20 (*) >90 mL/min   GFR calc Af Amer 23 (*) >90 mL/min   Comment:            The eGFR has been calculated     using the CKD EPI equation.     This calculation has not been     validated in all clinical     situations.     eGFR's persistently     <90 mL/min signify     possible Chronic Kidney Disease.   US Renal  12/14/2012  *RADIOLOGY REPORT*  Clinical Data: Acute renal failure.  History squamous cell carcinoma and hypertension.  RENAL/URINARY TRACT ULTRASOUND COMPLETE  Comparison:  P E T 10/26/2012  Findings:  Right Kidney:  11.7 cm. No hydronephrosis.  Normal renal cortical thickness and echogenicity.  Left Kidney:  11.3 cm. No hydronephrosis.  Normal renal cortical thickness and echogenicity.  Favor  fetal lobulation or dromedary hump in the inter/lower pole left  kidney.  Bladder:  Within normal limits.  Incidental note is made of moderate prostatomegaly.  IMPRESSION: No hydronephrosis.  Prostatomegaly.   Original Report Authenticated By: Jeronimo Greaves, M.D.      MEDICATIONS: Reviewed   PROBLEM LIST:  1.  acute renal failure. 2.  head neck cancer. 3.  Hyponatremia due to dehydration or chemotherapy 4.  Mucositis 5.  chemotherapy-induced anemia 6.  moderate calorie protein malnutrition  PLAN: - his creatinine continues to improve. However, it is still 2.9. Continue IV fluid for now. There is no evidence of fluid overload on exam today. Renal ultrasound did not show evidence of hydronephrosis - He will continue to receive radiation therapy while he is here.  - Disposition: Plan to discharge home for self-care tomorrow - I advised him to continue mouth rinse and mouthwash.  FULL CODE.

## 2012-12-15 NOTE — Progress Notes (Signed)
[facility] Radiation Oncology Dept Therapy Treatment Record Phone [phone]   Radiation Therapy was administered to Omar Sawyer on: 12/15/2012  9:34 AM and was treatment # 25 out of a planned course of 35 treatments.

## 2012-12-16 ENCOUNTER — Encounter: Payer: Self-pay | Admitting: Oncology

## 2012-12-16 ENCOUNTER — Other Ambulatory Visit: Payer: Self-pay | Admitting: Oncology

## 2012-12-16 DIAGNOSIS — K1231 Oral mucositis (ulcerative) due to antineoplastic therapy: Secondary | ICD-10-CM

## 2012-12-16 DIAGNOSIS — C099 Malignant neoplasm of tonsil, unspecified: Secondary | ICD-10-CM

## 2012-12-16 DIAGNOSIS — N289 Disorder of kidney and ureter, unspecified: Secondary | ICD-10-CM | POA: Insufficient documentation

## 2012-12-16 HISTORY — DX: Disorder of kidney and ureter, unspecified: N28.9

## 2012-12-16 LAB — BASIC METABOLIC PANEL
BUN: 38 mg/dL — ABNORMAL HIGH (ref 6–23)
Chloride: 97 mEq/L (ref 96–112)
GFR calc Af Amer: 30 mL/min — ABNORMAL LOW (ref >90)
GFR calc non Af Amer: 26 mL/min — ABNORMAL LOW (ref >90)
Potassium: 3.9 mEq/L (ref 3.5–5.1)
Sodium: 130 mEq/L — ABNORMAL LOW (ref 135–145)

## 2012-12-16 MED ORDER — DSS 100 MG PO CAPS: 100.0000 mg | ORAL_CAPSULE | Freq: Two times a day (BID) | ORAL | Status: DC

## 2012-12-16 MED ORDER — FREE WATER: 60.0000 mL | Freq: Four times a day (QID) | Status: DC

## 2012-12-16 MED ORDER — BOOST PLUS PO LIQD: 237.0000 mL | Freq: Three times a day (TID) | ORAL | Status: DC

## 2012-12-16 MED ORDER — FENTANYL 50 MCG/HR TD PT72: 1.0000 | MEDICATED_PATCH | TRANSDERMAL | Status: DC

## 2012-12-16 MED ORDER — JEVITY 1.2 CAL PO LIQD: 237.0000 mL | Freq: Four times a day (QID) | ORAL | Status: DC

## 2012-12-16 NOTE — Progress Notes (Signed)
Pt stable at time of discharge. Gave pt his prescriptions and discharge packet. After discharge education pt did not have further questions. IV removed.

## 2012-12-16 NOTE — Progress Notes (Signed)
Physician Discharge Summary   Patient ID: Omar Sawyer [id number] y.o. 12/02/1940  Admit date: 12/13/2012  Discharge date and time: 12/16/2012  Admitting Physician: Exie Parody, MD   Discharge Physician: Exie Parody, MD  Admission Diagnoses: acute renal failure and tonsile ca  Discharge Diagnoses: acute renal failure and tonsile ca  Admission Condition: fair  Discharged Condition: fair  Indication for Admission: acute renal failure.   Hospital Course:    1. acute renal failure:  Due to dehydration and chemo cisplatin.  He received aggressive IVF resuscitation without SOB. Renal US did not show evidence of hydronephrosis.  His discharge Cr was 2.3 from 5.6 on admission. I instructed him and his wife to have at least 60-oz of fluid daily.  2. head neck cancer. He continued with daily radiation while admitted.  I discussed with patient and his wife that I would discontinue chemo due to renal failure and hearing loss.  He had already received 2 out of 3 doses of chemo and will continue with the rest of the radiation.  3. Hyponatremia due to dehydration or chemotherapy:  Improved with IVF.  4. Mucositis:  From treatment.  He was continued on mouth rinses, Fentanyl patch, Hydrocodone/APAP.  5. chemotherapy-induced anemia:  Stable.  There was no active bleeding.  There was no severe symptom from anemia.  He did not need blood transfusion.  6. moderate calorie protein malnutrition:  He was seen by Nutrition and was resumed on Jevity tube feed.    Consults:  Nutrition.   Significant Diagnostic Studies:   RENAL/URINARY TRACT ULTRASOUND COMPLETE  Comparison: P E T 10/26/2012   Findings:  Right Kidney: 11.7 cm. No hydronephrosis. Normal renal cortical  thickness and echogenicity.  Left Kidney: 11.3 cm. No hydronephrosis. Normal renal cortical  thickness and echogenicity. Favor fetal lobulation or dromedary  hump in the inter/lower pole left kidney.  Bladder: Within normal limits.   Incidental note is made of moderate prostatomegaly.   IMPRESSION:  No hydronephrosis.  Prostatomegaly.  Treatments:  IV fluid resuscitation.   Discharge Exam:  Gen: Well-nourished man, in no acute distress. Eyes: No scleral icterus or jaundice. ENT: erythematous and fibrotic from radiation.  There was no exudative discharge.  Neck was supple without thyromegaly. Lymphatics: Negative for cervical, supraclavicular, axillary, or inguinal adenopathy.  Respiratory: Lungs were clear bilaterally without wheezing or crackles. Cardiovascular: normal heart rate and rhythm; S1/S2; without murmur, rubs, or gallop. There was no pedal edema. GI: Abdomen was soft, flat, nontender, nondistended, without organomegaly. Musculoskeletal exam: No spinal tenderness on palpation of vertebral spine. There was mild edema in bilateral upper extremities and trace in bilateral lower extremities.  Skin exam was without ecchymosis, petechiae. Neuro exam was nonfocal. Patient was able to get on and off exam table without assistance. Gait was normal. Patient was alert and oriented. Attention was good. Language was appropriate. Mood was normal without depression. Speech was not pressured. Thought content was not tangential.     Disposition:  Home for self care.   Patient Instructions:    Medication List    STOP taking these medications       fentaNYL 25 MCG/HR  Commonly known as:  DURAGESIC - dosed mcg/hr     hydrochlorothiazide 25 MG tablet  Commonly known as:  HYDRODIURIL     lisinopril 20 MG tablet  Commonly known as:  PRINIVIL,ZESTRIL     potassium chloride 10 MEQ tablet  Commonly known as:  K-DUR     PRESCRIPTION  MEDICATION      TAKE these medications       amLODipine 5 MG tablet  Commonly known as:  NORVASC  Take 5 mg by mouth daily before breakfast.     aspirin EC 81 MG tablet  Take 81 mg by mouth every evening.     cholecalciferol 1000 UNITS tablet  Commonly known as:  VITAMIN D  Take 1,000  Units by mouth daily.     DSS 100 MG Caps  Take 100 mg by mouth 2 (two) times daily.     feeding supplement (JEVITY 1.2 CAL) Liqd  Place 237 mLs into feeding tube 4 (four) times daily.     lactose free nutrition Liqd  Take 237 mLs by mouth 4 (four) times daily -  with meals and at bedtime.     fentaNYL 50 MCG/HR  Commonly known as:  DURAGESIC - dosed mcg/hr  Place 1 patch (50 mcg total) onto the skin every 3 (three) days.     free water Soln  Place 60 mLs into feeding tube 4 (four) times daily.     HYDROcodone-acetaminophen 7.5-325 mg/15 ml solution  Commonly known as:  HYCET  Take 15 mLs by mouth every 4 (four) hours as needed for pain.     LORazepam 0.5 MG tablet  Commonly known as:  ATIVAN  Take 1 tablet (0.5 mg total) by mouth every 6 (six) hours as needed (anticipation nausea or vomiting).     magic mouthwash w/lidocaine Soln  Swish and swallow 5 mLs 4 (four) times daily.     metoprolol succinate 50 MG 24 hr tablet  Commonly known as:  TOPROL-XL  Take 75 mg by mouth daily before breakfast.     omeprazole 20 MG capsule  Commonly known as:  PRILOSEC  Take 20 mg by mouth daily.     ondansetron 8 MG tablet  Commonly known as:  ZOFRAN  Take 1 tablet (8 mg total) by mouth every 12 (twelve) hours as needed for nausea. Take two times a day as needed for nausea or vomiting starting on the third day after chemotherapy.     prochlorperazine 10 MG tablet  Commonly known as:  COMPAZINE  Take 10 mg by mouth every 6 (six) hours as needed (for nausea/vomiting).     simvastatin 20 MG tablet  Commonly known as:  ZOCOR  Take 20 mg by mouth at bedtime.     sodium fluoride 1.1 % Gel dental gel  Commonly known as:  FLUORISHIELD  Brush and floss. Instill one drop of fluoride into fluoride tray. Place over teeth for 5 minutes. Remove. Spit out excess. Repeat nightly     tamsulosin 0.4 MG Caps  Commonly known as:  FLOMAX  Take 0.4 mg by mouth daily.       Activity: activity as  tolerated Diet:  PEG tube with Jevity about 4 cans/day.  He can take oral as tolerated.  Wound Care: none needed  Follow-up with Cancer Center Monday 12/18/12 for lab check (come up to [facility] after radiation).  Friday 12/21/12 for lab and MD visit.     SignedJethro Bolus 12/16/2012 9:44 AM

## 2012-12-18 ENCOUNTER — Ambulatory Visit
Admission: RE | Admit: 2012-12-18 | Discharge: 2012-12-18 | Disposition: A | Discharge status: Home / Self Care | Payer: Medicare Other | Source: Ambulatory Visit | Attending: Radiation Oncology | Admitting: Radiation Oncology

## 2012-12-18 ENCOUNTER — Telehealth: Payer: Self-pay | Admitting: Oncology

## 2012-12-18 ENCOUNTER — Telehealth: Payer: Self-pay | Admitting: *Deleted

## 2012-12-18 ENCOUNTER — Other Ambulatory Visit: Payer: Self-pay | Admitting: Lab

## 2012-12-18 VITALS — BP 122/66 | HR 83 | Temp 98.6°F | Wt 184.4 lb

## 2012-12-18 DIAGNOSIS — C099 Malignant neoplasm of tonsil, unspecified: Secondary | ICD-10-CM

## 2012-12-18 MED ORDER — FENTANYL 75 MCG/HR TD PT72: 1.0000 | MEDICATED_PATCH | TRANSDERMAL | Status: DC

## 2012-12-18 MED ORDER — SCOPOLAMINE 1 MG/3DAYS TD PT72: 1.0000 | MEDICATED_PATCH | TRANSDERMAL | Status: DC

## 2012-12-18 NOTE — Patient Instructions (Signed)
1. Diagnosis: Oropharynx cancer 2. Treatment: Radiation and chemotherapy. 3. Status: Poor toleration to chemotherapy due to severe mouth sore and kidney problem. 4. Recommendation: Continue with radiation but discontinue further chemotherapy. 5. To decrease thick oral phlegm: Continue mouth rinses with diluted margin peroxide mouth. Consider Younker suction.  6. Please remember to have at least 60-70 ounces of fluid daily to decrease the risk of dehydration

## 2012-12-18 NOTE — Telephone Encounter (Signed)
Pt missed lab appt today.  Called wife to inform of lab r/s to tomorrow at 8:45 am prior to his XRT appt.  She verbalized understanding.

## 2012-12-18 NOTE — Progress Notes (Signed)
   Weekly Management Note:  Outpatient Current Dose: 52 Gy  Projected Dose: 70 Gy   Narrative:  The patient presents for routine under treatment assessment.  CBCT/MVCT images/Port film x-rays were reviewed.  The chart was checked. Recently discharged after admission for acute renal failure. Will not receive 3rd cycle of CDDP.  7/10 throat pain. Boost better tolerated than Jevity, which he regurgitates.  But cannot get more than 2-3 cans of boost daily.  Thick saliva causes him to cough at night - very annoying.  Physical Findings:  weight is 184 lb 6.4 oz (83.643 kg). His temperature is 98.6 F (37 C). His blood pressure is 122/66 and his pulse is 83.  Miserable appearing, non toxic.  Erythema and dryness over neck.  Thickened secretions, confluent mucositis. CBC    Component Value Date/Time   WBC 6.7 12/13/2012 0952   WBC 6.8 11/07/2012 0745   RBC 4.02* 12/13/2012 0952   RBC 5.00 11/07/2012 0745   HGB 12.3* 12/13/2012 0952   HGB 15.2 11/07/2012 0745   HCT 35.5* 12/13/2012 0952   HCT 44.9 11/07/2012 0745   PLT 120* 12/13/2012 0952   PLT 198 11/07/2012 0745   MCV 88.3 12/13/2012 0952   MCV 89.8 11/07/2012 0745   MCH 30.6 12/13/2012 0952   MCH 30.4 11/07/2012 0745   MCHC 34.6 12/13/2012 0952   MCHC 33.9 11/07/2012 0745   RDW 13.5 12/13/2012 0952   RDW 13.1 11/07/2012 0745   LYMPHSABS 0.3* 12/13/2012 0952   LYMPHSABS 1.0 05/01/2008 1947   MONOABS 0.2 12/13/2012 0952   MONOABS 0.7 05/01/2008 1947   EOSABS 0.0 12/13/2012 0952   EOSABS 0.1 05/01/2008 1947   BASOSABS 0.0 12/13/2012 0952   BASOSABS 0.1 05/01/2008 1947    CMP     Component Value Date/Time   NA 130* 12/16/2012 0402   NA 128* 12/13/2012 0952   K 3.9 12/16/2012 0402   K 5.2* 12/13/2012 0952   CL 97 12/16/2012 0402   CL 88* 12/13/2012 0952   CO2 24 12/16/2012 0402   CO2 27 12/13/2012 0952   GLUCOSE 99 12/16/2012 0402   GLUCOSE 189* 12/13/2012 0952   BUN 38* 12/16/2012 0402   BUN 81.7* 12/13/2012 0952   CREATININE 2.34* 12/16/2012 0402   CREATININE  5.6 Repeated and Verified* 12/13/2012 0952   CALCIUM 7.2* 12/16/2012 0402   CALCIUM 9.4 12/13/2012 0952   PROT 7.1 12/13/2012 0952   PROT 7.5 06/23/2012 1037   ALBUMIN 3.2* 12/13/2012 0952   ALBUMIN 3.9 06/23/2012 1037   AST 17 12/13/2012 0952   AST 24 06/23/2012 1037   ALT 31 12/13/2012 0952   ALT 35 06/23/2012 1037   ALKPHOS 74 12/13/2012 0952   ALKPHOS 53 06/23/2012 1037   BILITOT 0.58 12/13/2012 0952   BILITOT 0.4 06/23/2012 1037   GFRNONAA 26* 12/16/2012 0402   GFRAA 30* 12/16/2012 0402     Impression:  The patient is tolerating radiotherapy.  Plan:  Continue radiotherapy as planned.  Rx given for Scopolamine for thick saliva. Rx given for Fentanyl 75 mcg in case this is needed in lieu of 50 mcg. Suggested trying half a can of boost q 90 mins.  Suggested taking Hycet via PEG with some boost at same time for better toleration of Hycet.    ________________________________   Lonie Peak, M.D.

## 2012-12-18 NOTE — Telephone Encounter (Signed)
pt at radiation...advised Mrs Clydie Braun in radiation to advise pt to go to the lab after radonc

## 2012-12-19 ENCOUNTER — Other Ambulatory Visit (HOSPITAL_BASED_OUTPATIENT_CLINIC_OR_DEPARTMENT_OTHER): Payer: Medicare Other | Admitting: Lab

## 2012-12-19 ENCOUNTER — Ambulatory Visit
Admission: RE | Admit: 2012-12-19 | Discharge: 2012-12-19 | Disposition: A | Discharge status: Home / Self Care | Payer: Medicare Other | Source: Ambulatory Visit | Attending: Radiation Oncology | Admitting: Radiation Oncology

## 2012-12-19 DIAGNOSIS — C099 Malignant neoplasm of tonsil, unspecified: Secondary | ICD-10-CM

## 2012-12-19 DIAGNOSIS — N289 Disorder of kidney and ureter, unspecified: Secondary | ICD-10-CM

## 2012-12-20 ENCOUNTER — Other Ambulatory Visit: Payer: Self-pay | Admitting: Oncology

## 2012-12-20 ENCOUNTER — Ambulatory Visit
Admission: RE | Admit: 2012-12-20 | Discharge: 2012-12-20 | Disposition: A | Discharge status: Home / Self Care | Payer: Medicare Other | Source: Ambulatory Visit | Attending: Radiation Oncology | Admitting: Radiation Oncology

## 2012-12-20 DIAGNOSIS — C099 Malignant neoplasm of tonsil, unspecified: Secondary | ICD-10-CM

## 2012-12-20 NOTE — Discharge Summary (Signed)
Patient ID:  Omar Sawyer  [id number]  72 y.o.  May 23, 1941  Admit date: 12/13/2012  Discharge date and time: 12/16/2012  Admitting Physician: Exie Parody, MD  Discharge Physician: Exie Parody, MD  Admission Diagnoses: acute renal failure and tonsile ca  Discharge Diagnoses: acute renal failure and tonsile ca  Admission Condition: fair  Discharged Condition: fair  Indication for Admission: acute renal failure.  Hospital Course:  1. acute renal failure: Due to dehydration and chemo cisplatin. He received aggressive IVF resuscitation without SOB. Renal US did not show evidence of hydronephrosis. His discharge Cr was 2.3 from 5.6 on admission. I instructed him and his wife to have at least 60-oz of fluid daily.  2. head neck cancer. He continued with daily radiation while admitted. I discussed with patient and his wife that I would discontinue chemo due to renal failure and hearing loss. He had already received 2 out of 3 doses of chemo and will continue with the rest of the radiation.  3. Hyponatremia due to dehydration or chemotherapy: Improved with IVF.  4. Mucositis: From treatment. He was continued on mouth rinses, Fentanyl patch, Hydrocodone/APAP.  5. chemotherapy-induced anemia: Stable. There was no active bleeding. There was no severe symptom from anemia. He did not need blood transfusion.  6. moderate calorie protein malnutrition: He was seen by Nutrition and was resumed on Jevity tube feed.  Consults: Nutrition.  Significant Diagnostic Studies:  RENAL/URINARY TRACT ULTRASOUND COMPLETE  Comparison: P E T 10/26/2012  Findings:  Right Kidney: 11.7 cm. No hydronephrosis. Normal renal cortical  thickness and echogenicity.  Left Kidney: 11.3 cm. No hydronephrosis. Normal renal cortical  thickness and echogenicity. Favor fetal lobulation or dromedary  hump in the inter/lower pole left kidney.  Bladder: Within normal limits.  Incidental note is made of moderate prostatomegaly.  IMPRESSION:   No hydronephrosis.  Prostatomegaly.  Treatments: IV fluid resuscitation.  Discharge Exam:  Gen: Well-nourished man, in no acute distress. Eyes: No scleral icterus or jaundice. ENT: erythematous and fibrotic from radiation. There was no exudative discharge. Neck was supple without thyromegaly. Lymphatics: Negative for cervical, supraclavicular, axillary, or inguinal adenopathy. Respiratory: Lungs were clear bilaterally without wheezing or crackles. Cardiovascular: normal heart rate and rhythm; S1/S2; without murmur, rubs, or gallop. There was no pedal edema. GI: Abdomen was soft, flat, nontender, nondistended, without organomegaly. Musculoskeletal exam: No spinal tenderness on palpation of vertebral spine. There was mild edema in bilateral upper extremities and trace in bilateral lower extremities. Skin exam was without ecchymosis, petechiae. Neuro exam was nonfocal. Patient was able to get on and off exam table without assistance. Gait was normal. Patient was alert and oriented. Attention was good. Language was appropriate. Mood was normal without depression. Speech was not pressured. Thought content was not tangential.  Disposition: Home for self care.  Patient Instructions:    Medication List     STOP taking these medications       fentaNYL 25 MCG/HR    Commonly known as: DURAGESIC - dosed mcg/hr    hydrochlorothiazide 25 MG tablet    Commonly known as: HYDRODIURIL    lisinopril 20 MG tablet    Commonly known as: PRINIVIL,ZESTRIL    potassium chloride 10 MEQ tablet    Commonly known as: K-DUR    PRESCRIPTION MEDICATION     TAKE these medications       amLODipine 5 MG tablet    Commonly known as: NORVASC    Take 5 mg by mouth daily before  breakfast.    aspirin EC 81 MG tablet    Take 81 mg by mouth every evening.    cholecalciferol 1000 UNITS tablet    Commonly known as: VITAMIN D    Take 1,000 Units by mouth daily.    DSS 100 MG Caps    Take 100 mg by mouth 2 (two) times daily.     feeding supplement (JEVITY 1.2 CAL) Liqd    Place 237 mLs into feeding tube 4 (four) times daily.    lactose free nutrition Liqd    Take 237 mLs by mouth 4 (four) times daily - with meals and at bedtime.    fentaNYL 50 MCG/HR    Commonly known as: DURAGESIC - dosed mcg/hr    Place 1 patch (50 mcg total) onto the skin every 3 (three) days.    free water Soln    Place 60 mLs into feeding tube 4 (four) times daily.    HYDROcodone-acetaminophen 7.5-325 mg/15 ml solution    Commonly known as: HYCET    Take 15 mLs by mouth every 4 (four) hours as needed for pain.    LORazepam 0.5 MG tablet    Commonly known as: ATIVAN    Take 1 tablet (0.5 mg total) by mouth every 6 (six) hours as needed (anticipation nausea or vomiting).    magic mouthwash w/lidocaine Soln    Swish and swallow 5 mLs 4 (four) times daily.    metoprolol succinate 50 MG 24 hr tablet    Commonly known as: TOPROL-XL    Take 75 mg by mouth daily before breakfast.    omeprazole 20 MG capsule    Commonly known as: PRILOSEC    Take 20 mg by mouth daily.    ondansetron 8 MG tablet    Commonly known as: ZOFRAN    Take 1 tablet (8 mg total) by mouth every 12 (twelve) hours as needed for nausea. Take two times a day as needed for nausea or vomiting starting on the third day after chemotherapy.    prochlorperazine 10 MG tablet    Commonly known as: COMPAZINE    Take 10 mg by mouth every 6 (six) hours as needed (for nausea/vomiting).    simvastatin 20 MG tablet    Commonly known as: ZOCOR    Take 20 mg by mouth at bedtime.    sodium fluoride 1.1 % Gel dental gel    Commonly known as: FLUORISHIELD    Brush and floss. Instill one drop of fluoride into fluoride tray. Place over teeth for 5 minutes. Remove. Spit out excess. Repeat nightly    tamsulosin 0.4 MG Caps    Commonly known as: FLOMAX    Take 0.4 mg by mouth daily.     Activity: activity as tolerated  Diet: PEG tube with Jevity about 4 cans/day. He can take oral as  tolerated.  Wound Care: none needed  Follow-up with Cancer Center Monday 12/18/12 for lab check (come up to [facility] after radiation).  Friday 12/21/12 for lab and MD visit.  SignedJethro Bolus  12/16/2012  9:44 AM

## 2012-12-21 ENCOUNTER — Ambulatory Visit (HOSPITAL_BASED_OUTPATIENT_CLINIC_OR_DEPARTMENT_OTHER): Payer: Medicare Other | Admitting: Oncology

## 2012-12-21 ENCOUNTER — Encounter: Payer: Self-pay | Admitting: Oncology

## 2012-12-21 ENCOUNTER — Telehealth: Payer: Self-pay | Admitting: Oncology

## 2012-12-21 ENCOUNTER — Ambulatory Visit
Admission: RE | Admit: 2012-12-21 | Discharge: 2012-12-21 | Disposition: A | Discharge status: Home / Self Care | Payer: Medicare Other | Source: Ambulatory Visit | Attending: Radiation Oncology | Admitting: Radiation Oncology

## 2012-12-21 ENCOUNTER — Other Ambulatory Visit (HOSPITAL_BASED_OUTPATIENT_CLINIC_OR_DEPARTMENT_OTHER): Payer: Medicare Other | Admitting: Lab

## 2012-12-21 VITALS — BP 137/68 | HR 95 | Temp 98.5°F | Resp 18 | Ht 69.0 in | Wt 182.9 lb

## 2012-12-21 DIAGNOSIS — N289 Disorder of kidney and ureter, unspecified: Secondary | ICD-10-CM

## 2012-12-21 DIAGNOSIS — K121 Other forms of stomatitis: Secondary | ICD-10-CM

## 2012-12-21 DIAGNOSIS — C099 Malignant neoplasm of tonsil, unspecified: Secondary | ICD-10-CM

## 2012-12-21 LAB — CBC WITH DIFFERENTIAL/PLATELET
Basophils Absolute: 0 10*3/uL (ref 0.0–0.1)
EOS%: 1.7 % (ref 0.0–7.0)
Eosinophils Absolute: 0 10*3/uL (ref 0.0–0.5)
HCT: 27.2 % — ABNORMAL LOW (ref 38.4–49.9)
HGB: 9.4 g/dL — ABNORMAL LOW (ref 13.0–17.1)
MONO#: 0.5 10*3/uL (ref 0.1–0.9)
NEUT#: 0.6 10*3/uL — ABNORMAL LOW (ref 1.5–6.5)
NEUT%: 49.8 % (ref 39.0–75.0)
RDW: 13.5 % (ref 11.0–14.6)
WBC: 1.2 10*3/uL — ABNORMAL LOW (ref 4.0–10.3)
lymph#: 0.1 10*3/uL — ABNORMAL LOW (ref 0.9–3.3)

## 2012-12-21 LAB — BASIC METABOLIC PANEL (CC13)
BUN: 21.2 mg/dL (ref 7.0–26.0)
CO2: 31 mEq/L — ABNORMAL HIGH (ref 22–29)
Chloride: 87 mEq/L — ABNORMAL LOW (ref 98–107)
Creatinine: 1.7 mg/dL — ABNORMAL HIGH (ref 0.7–1.3)
Glucose: 200 mg/dl — ABNORMAL HIGH (ref 70–99)
Potassium: 3.1 mEq/L — ABNORMAL LOW (ref 3.5–5.1)

## 2012-12-21 NOTE — Telephone Encounter (Signed)
Per 4/24 pof cx all chemo appts - no more chemo. appt for 4/28 lb/tx cx'd and crossed off pt's schedule. Wife aware and has a copy of May appts.

## 2012-12-21 NOTE — Treatment Plan (Signed)
[facility] END OF TREATMENT   Name: Omar Sawyer Date: 12/21/2012 MRN: [id number] DOB: April 24, 1941   TREATMENT DATES: 11/13/2012 to 12/04/2012   REFERRING PHYSICIAN: Dr. Suzanna Obey, M.D.   DIAGNOSIS: right tonsil squamous cell carcinoma.     STAGE AT START OF TREATMENT: cT2 N2b M0    INTENT:Curative   DRUGS OR REGIMENS GIVEN:  Cisplatin 100mg /m2 IV once every 3 weeks.    MAJOR TOXICITIES:grade 2-3 right sided worsening hearing loss from baseline; grade 1-2 fatigue; grade 2 mucositis; grade 2 anorexia/weight loss, grade 3 acute renal failure; grade 2 neutropenia.    REASON TREATMENT STOPPED:  Poor toleration, grade 3 renal insufficiency.    PERFORMANCE STATUS AT END: ECOG 2.    ONGOING PROBLEMS:  Fatigue, weight loss, mucositis.    FOLLOW UP PLANS: repeat PET scan in about 4 months to ascertain response.

## 2012-12-21 NOTE — Progress Notes (Signed)
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   PAST THERAPY:  Biopsy only.   CURRENT THERAPY:  Started concurrent chemo with q3 weeks Cisplatin and daily radiation on 11/13/2012.   INTERVAL HISTORY: Omar Sawyer 72 y.o. male returns for regular follow up with his wife.  He was recently admitted to the hospital for acute renal failure secondary to dehydration. His renal function improved significantly with IV fluid resuscitation. He returned to clinic for regular followup. He reports having more mucositis pain. Pain is 8/10 at time. His fentanyl patch was increased from 25 to 50 mcg per hour this past weekend. He still requires 4 times a day and Elixir for breakthrough pain. He is able to eat with the mouth. He is able to drink ginger ale which improved his thick phlegm.  He is only able to take a about 2-3 cans of either boost or Ensure via PEG tube daily. More than that, he has nausea vomiting. He has not tried PEG tube via continuous pump. He has mild fatigue; he spent most of his awake time in a chair or in bed. He is still independent of personal hygiene activities.  Patient denies fever, headache, visual changes, confusion, drenching night sweats, palpable lymph node swelling, mucositis, odynophagia, dysphagia, nausea vomiting, jaundice, chest pain, palpitation, shortness of breath, dyspnea on exertion, productive cough, gum bleeding, epistaxis, hematemesis, hemoptysis, abdominal pain, abdominal swelling, early satiety, melena, hematochezia, hematuria, skin rash, spontaneous bleeding, joint swelling, joint pain, heat or cold intolerance, bowel bladder incontinence, back pain, focal motor weakness, paresthesia, depression.    Past Medical History  Diagnosis Date  . Essential hypertension, benign   . Mixed hyperlipidemia   . Unspecified sinusitis  (chronic)   . Edema   . Personal history of adenomatous colonic polyps 07/23/2012    2010  . Hearing loss   . Hx of prostatitis   . 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 Anxiety     mild new dx  . GERD (gastroesophageal reflux disease)   . Neuromuscular disorder     b/l tremors,   . Basal cell carcinoma   . Mucositis 12/04/2012  . Renal insufficiency 12/16/2012    Past Surgical History  Procedure Laterality Date  . Colonoscopy  2011    polyps. Dr. Leone Payor.  Due now 2013  . Biopsy of right tonsil Right 10/03/2012    Squamous Cell Carcinoma     . Blepharoplasty      Current Outpatient Prescriptions  Medication Sig Dispense Refill  . Alum & Mag Hydroxide-Simeth (MAGIC MOUTHWASH W/LIDOCAINE) SOLN Swish and swallow 5 mLs 4 (four) times daily.      Marland Kitchen amLODipine (NORVASC) 5 MG tablet Take 5 mg by mouth daily before breakfast.       . aspirin EC 81 MG tablet Take 81 mg by mouth every evening.       . cholecalciferol (VITAMIN D) 1000 UNITS tablet Take 1,000 Units by mouth daily.      Marland Kitchen docusate sodium 100 MG CAPS Take 100 mg by mouth 2 (two) times daily.  10 capsule    . fentaNYL (DURAGESIC - DOSED MCG/HR)  50 MCG/HR Place 1 patch (50 mcg total) onto the skin every 3 (three) days.  10 patch  0  . fentaNYL (DURAGESIC - DOSED MCG/HR) 75 MCG/HR Place 1 patch (75 mcg total) onto the skin every 3 (three) days. Use if 50 mcg not strong enough  5 patch  0  . HYDROcodone-acetaminophen (HYCET) 7.5-325 mg/15 ml solution Take 15 mLs by mouth every 4 (four) hours as needed for pain.  473 mL  3  . KLOR-CON 10 10 MEQ tablet       . lactose free nutrition (BOOST PLUS) LIQD Take 237 mLs by mouth 4 (four) times daily -  with meals and at bedtime.      Marland Kitchen lisinopril (PRINIVIL,ZESTRIL) 20 MG tablet       . LORazepam (ATIVAN) 0.5 MG tablet Take 1  tablet (0.5 mg total) by mouth every 6 (six) hours as needed (anticipation nausea or vomiting).  30 tablet  1  . metoprolol succinate (TOPROL-XL) 50 MG 24 hr tablet Take 75 mg by mouth daily before breakfast.       . Nutritional Supplements (FEEDING SUPPLEMENT, JEVITY 1.2 CAL,) LIQD Place 237 mLs into feeding tube 4 (four) times daily.      Marland Kitchen omeprazole (PRILOSEC) 20 MG capsule Take 20 mg by mouth daily.       . ondansetron (ZOFRAN) 8 MG tablet Take 1 tablet (8 mg total) by mouth every 12 (twelve) hours as needed for nausea. Take two times a day as needed for nausea or vomiting starting on the third day after chemotherapy.  30 tablet  1  . prochlorperazine (COMPAZINE) 10 MG tablet Take 10 mg by mouth every 6 (six) hours as needed (for nausea/vomiting).      Marland Kitchen scopolamine (TRANSDERM-SCOP) 1.5 MG Place 1 patch (1.5 mg total) onto the skin every 3 (three) days. To dry up saliva.  10 patch  0  . simvastatin (ZOCOR) 20 MG tablet Take 20 mg by mouth at bedtime.       . sodium fluoride (FLUORISHIELD) 1.1 % GEL dental gel Brush and floss. Instill one drop of fluoride into fluoride tray. Place over teeth for 5 minutes. Remove. Spit out excess. Repeat nightly  120 mL  prn  . Water For Irrigation, Sterile (FREE WATER) SOLN Place 60 mLs into feeding tube 4 (four) times daily.      . hydrochlorothiazide (HYDRODIURIL) 25 MG tablet       . Tamsulosin HCl (FLOMAX) 0.4 MG CAPS Take 0.4 mg by mouth daily.        No current facility-administered medications for this visit.    ALLERGIES:  has No Known Allergies.   Filed Vitals:   12/21/12 0841  BP: 137/68  Pulse: 95  Temp: 98.5 F (36.9 C)  Resp: 18   Wt Readings from Last 3 Encounters:  12/21/12 182 lb 14.4 oz (82.963 kg)  12/18/12 184 lb 6.4 oz (83.643 kg)  12/16/12 184 lb 1.6 oz (83.507 kg)   ECOG Performance status: 1  PHYSICAL EXAMINATION:   General:  well-nourished man, in no acute distress.  Eyes:  no scleral icterus.  ENT:  There were no  oropharyngeal lesions.  Neck was without thyromegaly.  Lymphatics:  Negative cervical, supraclavicular or axillary adenopathy.  Respiratory: lungs were clear bilaterally without wheezing or crackles.  Cardiovascular:  Regular rate and rhythm, S1/S2, without murmur, rub or gallop.  There was no pedal edema.  GI:  abdomen was soft, flat, nontender, nondistended, without organomegaly. PEG tube  was dry clean intact.  Muscoloskeletal:  no spinal tenderness of palpation of vertebral spine.  Skin exam was without echymosis, petichae.  Neuro exam was nonfocal.  Patient was able to get on and off exam table without assistance.  Gait was normal.  Patient was alerted and oriented.  Attention was good.   Language was appropriate.  Mood was normal without depression.  Speech was not pressured.  Thought content was not tangential.      LABORATORY/RADIOLOGY DATA:  Lab Results  Component Value Date   WBC 1.2* 12/21/2012   HGB 9.4* 12/21/2012   HCT 27.2* 12/21/2012   PLT 180 12/21/2012   GLUCOSE 200* 12/21/2012   CHOL 112 06/23/2012   TRIG 103.0 06/23/2012   HDL 33.60* 06/23/2012   LDLCALC 58 06/23/2012   ALKPHOS 74 12/13/2012   ALT 31 12/13/2012   AST 17 12/13/2012   NA 130* 12/21/2012   K 3.1* 12/21/2012   CL 87* 12/21/2012   CREATININE 1.7* 12/21/2012   BUN 21.2 12/21/2012   CO2 31* 12/21/2012   INR 1.09 11/07/2012    ASSESSMENT AND PLAN:   1.  Diagnosis:  Head and neck cancer. 2.  Treatment:  Concurrent chemo radiation with every 3 weeks Cisplatin and daily radiation. 3.  Side effects: grade 2-3 right sided worsening hearing loss from baseline; grade 1-2 fatigue; grade 2 mucositis; grade 2 anorexia/weight loss, grade 3 acute renal failure; grade 2 neutropenia. 4.  recommendations: Continue with only radiation therapy number chemotherapy given the renal failure and cytopenia. He and his wife expressed informed understanding wished to start chemotherapy as well.  5.  Mucositis:  He has fentanyl patch at 50 mvh  per hour. Dr. Basilio Cairo recently changed to 75 mcg per hour. He still has breakthrough pain Lortab elixir.  6.  To prevent esophageal stricture:  I advised him to perform swallowing exercises more routinely.  7.  Nausea/vomiting medications:  - Prochloroperzaine (Brand name:  Compazine):  10mg  by mouth, every 6 hours as needed.  - Odansetron (Brand name:  Zofran):  4-8 mg by mouth or under the tongue, every 8-12 hours as needed.  -  Lorazepam (Brand name:  Ativan):  0.5mg  by mouth or under the tongue, every 6 hours; especially before meal for anticipatory nausea and vomiting.   8.  moderate calorie protein malnutrition: I discussed with Omar Sawyer to see if he is a candidate for PEG tube pump.  9.  renal insufficiency: Improved creatinine to 1.7. I again advised him to drink at least 60 ounces of fluid daily.   Follow up:  In about 10 days to ensure he is doing well.      The length of time of the face-to-face encounter was 15 minutes. More than 50% of time was spent counseling and coordination of care.      Maple Odaniel T. Gaylyn Rong, M.D.

## 2012-12-21 NOTE — Telephone Encounter (Signed)
gv pt appt schedule for April and May. °

## 2012-12-22 ENCOUNTER — Ambulatory Visit
Admission: RE | Admit: 2012-12-22 | Discharge: 2012-12-22 | Disposition: A | Discharge status: Home / Self Care | Payer: Medicare Other | Source: Ambulatory Visit | Attending: Radiation Oncology | Admitting: Radiation Oncology

## 2012-12-25 ENCOUNTER — Ambulatory Visit
Admission: RE | Admit: 2012-12-25 | Discharge: 2012-12-25 | Disposition: A | Discharge status: Home / Self Care | Payer: Medicare Other | Source: Ambulatory Visit | Attending: Radiation Oncology | Admitting: Radiation Oncology

## 2012-12-25 ENCOUNTER — Other Ambulatory Visit: Payer: Self-pay | Admitting: Lab

## 2012-12-25 ENCOUNTER — Ambulatory Visit: Payer: Medicare Other

## 2012-12-25 ENCOUNTER — Ambulatory Visit: Payer: Self-pay

## 2012-12-25 ENCOUNTER — Encounter: Payer: Self-pay | Admitting: Radiation Oncology

## 2012-12-25 VITALS — BP 146/75 | HR 91 | Temp 98.5°F | Resp 20 | Wt 180.9 lb

## 2012-12-25 DIAGNOSIS — C099 Malignant neoplasm of tonsil, unspecified: Secondary | ICD-10-CM

## 2012-12-25 MED ORDER — NYSTATIN 100000 UNIT/ML MT SUSP: 500000.0000 [IU] | Freq: Four times a day (QID) | OROMUCOSAL | Status: DC

## 2012-12-25 NOTE — Progress Notes (Signed)
Weekly Management Note:  Site: Right tonsil/neck Current Dose:  6000  cGy Projected Dose: 7000  cGy  Narrative: The patient is seen today for routine under treatment assessment. CBCT/MVCT images/port films were reviewed. The chart was reviewed.   He is without complaints today. He does have mild to moderate throat discomfort for which she uses Hycet liquid. Carafate has not been helpful. His weight is down 3 pounds over the past week. He is been taking only for cancer of nutritional supplementation a day. He is declined an infusion pump since he has difficulty sleeping. His white blood count is down to 1.2 K.  Physical Examination:  Filed Vitals:   12/25/12 1540  BP: 146/75  Pulse: 91  Temp: 98.5 F (36.9 C)  Resp: 20  .  Weight: 180 lb 14.4 oz (82.056 kg). There is hyperpigmentation and patchy dry desquamation the skin along his neck. There is no palpable adenopathy. On inspection oral cavity there is a confluent mucositis along his oropharynx. There is candidiasis along his oral tongue.  Laboratory data: Lab Results  Component Value Date   WBC 1.2* 12/21/2012   HGB 9.4* 12/21/2012   HCT 27.2* 12/21/2012   MCV 88.8 12/21/2012   PLT 180 12/21/2012     Impression: Tolerating radiation therapy well. He does have appears to be oral candidiasis. Again, he is not interested in an infusion pump to increase his caloric intake. He'll make every effort to increase his intake to his PEG to 6 cans a day.  Plan: Continue radiation therapy as planned. I'll start him on nystatin suspension for his oral candidiasis. He finishes his treatment in one week.

## 2012-12-25 NOTE — Progress Notes (Signed)
Pt being seen for weekly put prior to treatment today. He c/o painful swallowing 4/10, most helpful prn med is Hycet liquid, but he states it burns when he swallows. Suggested he put Hycet in feeding tube. Pt states MMW slightly helpful; Sucralfate not helpful at all. Pt applying Radiaplex cream to neck tx area for hyperpigmentation, dry desquamation. Pt sipping water, taking in 3-4 nutritional supplements daily through peg tube. Pt c/o thick saliva, wearing Scopolamine patch.

## 2012-12-26 ENCOUNTER — Ambulatory Visit
Admission: RE | Admit: 2012-12-26 | Discharge: 2012-12-26 | Disposition: A | Discharge status: Home / Self Care | Payer: Medicare Other | Source: Ambulatory Visit | Attending: Radiation Oncology | Admitting: Radiation Oncology

## 2012-12-26 ENCOUNTER — Other Ambulatory Visit: Payer: Self-pay | Admitting: Radiation Oncology

## 2012-12-26 ENCOUNTER — Ambulatory Visit: Payer: Medicare Other

## 2012-12-26 ENCOUNTER — Encounter: Payer: Self-pay | Admitting: Radiation Oncology

## 2012-12-26 MED ORDER — FLUCONAZOLE 100 MG PO TABS: 100.0000 mg | ORAL_TABLET | Freq: Every day | ORAL | Status: DC

## 2012-12-26 NOTE — Progress Notes (Signed)
Chart note: Omar Sawyer is gagging on nystatin suspension, and his wife requests that we place him on Diflucan. This has helped in the past. I prescribed Diflucan 100 mg to take 1 by mouth twice a day on day 1 and then 1 by mouth daily for 10 days. There is interaction with Zocor with risk for a myopathy and rhabdomyolysis. Therefore I asked him to stop his Zocor for the next 10 days while he is taking his Diflucan. He's report any muscle aches or weakness during this time.

## 2012-12-27 ENCOUNTER — Ambulatory Visit: Admission: RE | Admit: 2012-12-27 | Payer: Medicare Other | Source: Ambulatory Visit

## 2012-12-27 ENCOUNTER — Ambulatory Visit
Admission: RE | Admit: 2012-12-27 | Discharge: 2012-12-27 | Disposition: A | Discharge status: Home / Self Care | Payer: Medicare Other | Source: Ambulatory Visit | Attending: Radiation Oncology | Admitting: Radiation Oncology

## 2012-12-28 ENCOUNTER — Ambulatory Visit: Payer: Medicare Other

## 2012-12-28 ENCOUNTER — Ambulatory Visit
Admission: RE | Admit: 2012-12-28 | Discharge: 2012-12-28 | Disposition: A | Discharge status: Home / Self Care | Payer: Medicare Other | Source: Ambulatory Visit | Attending: Radiation Oncology | Admitting: Radiation Oncology

## 2012-12-29 ENCOUNTER — Encounter: Payer: Self-pay | Admitting: Radiation Oncology

## 2012-12-29 ENCOUNTER — Encounter: Payer: Self-pay | Admitting: Oncology

## 2012-12-29 ENCOUNTER — Ambulatory Visit: Payer: Medicare Other

## 2012-12-29 ENCOUNTER — Ambulatory Visit
Admission: RE | Admit: 2012-12-29 | Discharge: 2012-12-29 | Disposition: A | Discharge status: Home / Self Care | Payer: Medicare Other | Source: Ambulatory Visit | Attending: Radiation Oncology | Admitting: Radiation Oncology

## 2012-12-29 ENCOUNTER — Other Ambulatory Visit (HOSPITAL_BASED_OUTPATIENT_CLINIC_OR_DEPARTMENT_OTHER): Payer: Medicare Other

## 2012-12-29 ENCOUNTER — Ambulatory Visit (HOSPITAL_BASED_OUTPATIENT_CLINIC_OR_DEPARTMENT_OTHER): Payer: Medicare Other | Admitting: Oncology

## 2012-12-29 VITALS — BP 125/74 | HR 100 | Temp 98.6°F | Resp 20 | Ht 69.0 in | Wt 176.4 lb

## 2012-12-29 DIAGNOSIS — C099 Malignant neoplasm of tonsil, unspecified: Secondary | ICD-10-CM

## 2012-12-29 DIAGNOSIS — K121 Other forms of stomatitis: Secondary | ICD-10-CM

## 2012-12-29 DIAGNOSIS — N289 Disorder of kidney and ureter, unspecified: Secondary | ICD-10-CM

## 2012-12-29 LAB — CBC WITH DIFFERENTIAL/PLATELET
BASO%: 0.1 % (ref 0.0–2.0)
Eosinophils Absolute: 0 10*3/uL (ref 0.0–0.5)
HCT: 26.9 % — ABNORMAL LOW (ref 38.4–49.9)
LYMPH%: 1.3 % — ABNORMAL LOW (ref 14.0–49.0)
MCHC: 33.7 g/dL (ref 32.0–36.0)
MCV: 89.6 fL (ref 79.3–98.0)
MONO#: 1.1 10*3/uL — ABNORMAL HIGH (ref 0.1–0.9)
MONO%: 7.6 % (ref 0.0–14.0)
NEUT%: 91 % — ABNORMAL HIGH (ref 39.0–75.0)
Platelets: 427 10*3/uL — ABNORMAL HIGH (ref 140–400)
WBC: 14.8 10*3/uL — ABNORMAL HIGH (ref 4.0–10.3)

## 2012-12-29 LAB — TECHNOLOGIST REVIEW

## 2012-12-29 LAB — BASIC METABOLIC PANEL (CC13)
Chloride: 85 mEq/L — ABNORMAL LOW (ref 98–107)
Glucose: 210 mg/dl — ABNORMAL HIGH (ref 70–99)
Potassium: 3.8 mEq/L (ref 3.5–5.1)
Sodium: 129 mEq/L — ABNORMAL LOW (ref 136–145)

## 2012-12-29 NOTE — Progress Notes (Signed)
  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   He developed confluent mucositis and oral thrush and well as dry desquamation of his skin.    Plan: The patient has completed radiation treatment. The patient will return to radiation oncology clinic for routine follow up in 4 wks, sooner if needed. I advised them to call or return sooner if they have any questions or concerns related to their recovery or treatment.  -----------------------------------  Lonie Peak, MD

## 2012-12-29 NOTE — Progress Notes (Signed)
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   PAST THERAPY:  Biopsy only.   CURRENT THERAPY:  Started concurrent chemo with q3 weeks Cisplatin and daily radiation on 11/13/2012. Chemotherapy stopped after 2 cycles due to acute renal failure secondary to dehydration. Due to complete XRT today.  INTERVAL HISTORY: Omar Sawyer 72 y.o. male returns for regular follow up with his wife. He reports that his mucositis pain is better now. On Fentanyl 75 mcg/hr patch. He still requires 2-3 times a day and Elixir for breakthrough pain. He is able to drink ginger ale which improved his thick phlegm; but he still gags due to thick phlegm. Reports that he is not using diluted peroxide at all.  He is only able to take a about 3-5 cans of either boost or Ensure via PEG tube daily. More than that, he has nausea vomiting. He has not tried PEG tube via continuous pump; patient does not want to do this because "it will take away his dignity." He has mild fatigue; he spent most of his awake time in a chair or in bed. He is still independent of personal hygiene activities.  Patient denies fever, headache, visual changes, confusion, drenching night sweats, palpable lymph node swelling, mucositis, odynophagia, dysphagia, nausea vomiting, jaundice, chest pain, palpitation, shortness of breath, dyspnea on exertion, productive cough, gum bleeding, epistaxis, hematemesis, hemoptysis, abdominal pain, abdominal swelling, early satiety, melena, hematochezia, hematuria, skin rash, spontaneous bleeding, joint swelling, joint pain, heat or cold intolerance, bowel bladder incontinence, back pain, focal motor weakness, paresthesia, depression.    Past Medical History  Diagnosis Date  . Essential hypertension, benign   . Mixed hyperlipidemia   . Unspecified sinusitis (chronic)   .  Edema   . Personal history of adenomatous colonic polyps 07/23/2012    2010  . Hearing loss   . Hx of prostatitis   . 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 Anxiety     mild new dx  . GERD (gastroesophageal reflux disease)   . Neuromuscular disorder     b/l tremors,   . Basal cell carcinoma   . Mucositis 12/04/2012  . Renal insufficiency 12/16/2012    Past Surgical History  Procedure Laterality Date  . Colonoscopy  2011    polyps. Dr. Leone Payor.  Due now 2013  . Biopsy of right tonsil Right 10/03/2012    Squamous Cell Carcinoma     . Blepharoplasty      Current Outpatient Prescriptions  Medication Sig Dispense Refill  . Alum & Mag Hydroxide-Simeth (MAGIC MOUTHWASH W/LIDOCAINE) SOLN Swish and swallow 5 mLs 4 (four) times daily.      . cholecalciferol (VITAMIN D) 1000 UNITS tablet Take 1,000 Units by mouth daily.      Marland Kitchen docusate sodium 100 MG CAPS Take 100 mg by mouth 2 (two) times daily.  10 capsule    . fentaNYL (DURAGESIC - DOSED MCG/HR) 75 MCG/HR Place 1 patch (75 mcg total) onto the skin every 3 (three) days. Use if 50 mcg not strong enough  5 patch  0  . fluconazole (DIFLUCAN) 100 MG tablet Take 1 tablet (100 mg total) by  mouth daily.  11 tablet  0  . HYDROcodone-acetaminophen (HYCET) 7.5-325 mg/15 ml solution Take 15 mLs by mouth every 4 (four) hours as needed for pain.  473 mL  3  . lactose free nutrition (BOOST PLUS) LIQD Take 237 mLs by mouth 4 (four) times daily -  with meals and at bedtime.      Marland Kitchen lisinopril (PRINIVIL,ZESTRIL) 20 MG tablet       . LORazepam (ATIVAN) 0.5 MG tablet Take 1 tablet (0.5 mg total) by mouth every 6 (six) hours as needed (anticipation nausea or vomiting).  30 tablet  1  . metoprolol succinate (TOPROL-XL) 50 MG 24 hr tablet Take 75 mg by mouth daily before breakfast.       . Nutritional  Supplements (FEEDING SUPPLEMENT, JEVITY 1.2 CAL,) LIQD Place 237 mLs into feeding tube 4 (four) times daily.      Marland Kitchen nystatin (MYCOSTATIN) 100000 UNIT/ML suspension Take 5 mLs (500,000 Units total) by mouth 4 (four) times daily.  240 mL  1  . omeprazole (PRILOSEC) 20 MG capsule Take 20 mg by mouth daily.       . ondansetron (ZOFRAN) 8 MG tablet Take 1 tablet (8 mg total) by mouth every 12 (twelve) hours as needed for nausea. Take two times a day as needed for nausea or vomiting starting on the third day after chemotherapy.  30 tablet  1  . prochlorperazine (COMPAZINE) 10 MG tablet Take 10 mg by mouth every 6 (six) hours as needed (for nausea/vomiting).      Marland Kitchen scopolamine (TRANSDERM-SCOP) 1.5 MG Place 1 patch (1.5 mg total) onto the skin every 3 (three) days. To dry up saliva.  10 patch  0  . sodium fluoride (FLUORISHIELD) 1.1 % GEL dental gel Brush and floss. Instill one drop of fluoride into fluoride tray. Place over teeth for 5 minutes. Remove. Spit out excess. Repeat nightly  120 mL  prn  . sucralfate (CARAFATE) 1 G tablet       . Tamsulosin HCl (FLOMAX) 0.4 MG CAPS Take 0.4 mg by mouth daily.       . Water For Irrigation, Sterile (FREE WATER) SOLN Place 60 mLs into feeding tube 4 (four) times daily.       No current facility-administered medications for this visit.    ALLERGIES:  has No Known Allergies.   Filed Vitals:   12/29/12 1117  BP: 125/74  Pulse: 100  Temp: 98.6 F (37 C)  Resp: 20   Wt Readings from Last 3 Encounters:  12/29/12 176 lb 6.4 oz (80.015 kg)  12/25/12 180 lb 14.4 oz (82.056 kg)  12/21/12 182 lb 14.4 oz (82.963 kg)   ECOG Performance status: 1  PHYSICAL EXAMINATION:   General:  well-nourished man, in no acute distress.  Eyes:  no scleral icterus.  ENT:  There were no oropharyngeal lesions.  Neck was without thyromegaly.  Lymphatics:  Negative cervical, supraclavicular or axillary adenopathy.  Respiratory: lungs were clear bilaterally without wheezing or  crackles.  Cardiovascular:  Regular rate and rhythm, S1/S2, without murmur, rub or gallop.  There was no pedal edema.  GI:  abdomen was soft, flat, nontender, nondistended, without organomegaly. PEG tube was dry clean intact.  Muscoloskeletal:  no spinal tenderness of palpation of vertebral spine.  Skin exam was without echymosis, petichae.  Neuro exam was nonfocal.  Patient was able to get on and off exam table without assistance.  Gait was normal.  Patient was alerted and oriented.  Attention was good.  Language was appropriate.  Mood was normal without depression.  Speech was not pressured.  Thought content was not tangential.    LABORATORY/RADIOLOGY DATA:  Lab Results  Component Value Date   WBC 14.8* 12/29/2012   HGB 9.1* 12/29/2012   HCT 26.9* 12/29/2012   PLT 427* 12/29/2012   GLUCOSE 210* 12/29/2012   CHOL 112 06/23/2012   TRIG 103.0 06/23/2012   HDL 33.60* 06/23/2012   LDLCALC 58 06/23/2012   ALKPHOS 74 12/13/2012   ALT 31 12/13/2012   AST 17 12/13/2012   NA 129* 12/29/2012   K 3.8 12/29/2012   CL 85* 12/29/2012   CREATININE 1.3 12/29/2012   BUN 27.8* 12/29/2012   CO2 32* 12/29/2012   INR 1.09 11/07/2012    ASSESSMENT AND PLAN:   1.  Diagnosis:  Head and neck cancer. 2.  Treatment:  Concurrent chemo radiation with every 3 weeks Cisplatin and daily radiation. 3.  Side effects: grade 2-3 right sided worsening hearing loss from baseline; grade 1-2 fatigue; grade 2 mucositis; grade 2 anorexia/weight loss, grade 3 acute renal failure; grade 2 neutropenia. 4.  Received 2/3 doses of Cisplatin. Due to complete XRT today. 5.  Mucositis:  He has fentanyl patch at 75 mcg/hr. He still has breakthrough pain Lortab elixir. I have encouraged him to use salt/baking soda rinses and to begin using diluted H2O2. 6.  To prevent esophageal stricture:  I advised him to perform swallowing exercises more routinely. Sees ST next week. 7.  Nausea/vomiting medications:  - Prochloroperzaine (Brand name:  Compazine):  10mg   by mouth, every 6 hours as needed.  - Odansetron (Brand name:  Zofran):  4-8 mg by mouth or under the tongue, every 8-12 hours as needed.  -  Lorazepam (Brand name:  Ativan):  0.5mg  by mouth or under the tongue, every 6 hours; especially before meal for anticipatory nausea and vomiting.   8.  moderate calorie protein malnutrition: I discussed with Zenovia Jarred to see if he is a candidate for PEG tube pump.  9.  renal insufficiency: Improved creatinine to 1.3. I again advised him to drink at least 60 ounces of fluid daily.   Follow up: Mid-May as scheduled with Dr Gaylyn Rong.      The length of time of the face-to-face encounter was 15 minutes. More than 50% of time was spent counseling and coordination of care.

## 2013-01-01 ENCOUNTER — Ambulatory Visit: Payer: Medicare Other

## 2013-01-01 ENCOUNTER — Ambulatory Visit: Payer: Self-pay

## 2013-01-08 ENCOUNTER — Ambulatory Visit: Payer: Medicare Other

## 2013-01-08 ENCOUNTER — Ambulatory Visit: Payer: Medicare Other | Attending: Radiation Oncology

## 2013-01-08 DIAGNOSIS — I890 Lymphedema, not elsewhere classified: Secondary | ICD-10-CM | POA: Insufficient documentation

## 2013-01-08 DIAGNOSIS — IMO0001 Reserved for inherently not codable concepts without codable children: Secondary | ICD-10-CM | POA: Insufficient documentation

## 2013-01-09 ENCOUNTER — Telehealth: Payer: Self-pay | Admitting: *Deleted

## 2013-01-09 ENCOUNTER — Other Ambulatory Visit: Payer: Self-pay | Admitting: *Deleted

## 2013-01-09 MED ORDER — POTASSIUM CHLORIDE ER 10 MEQ PO TBCR: 10.0000 meq | EXTENDED_RELEASE_TABLET | Freq: Every day | ORAL | Status: DC

## 2013-01-09 NOTE — Telephone Encounter (Signed)
Omar Sawyer wife called to report that his BP's have been anywhere from 90/60, to 100/60 and that he fainted yesterday resulting in scraped knees, and his wife says he is"lightheaded".  Advised his wife to temporarily stop his Lisinopril and his Flomax until he is seen by Omar Sawyer on the 15 th because this would further reduce his BP.  She stated understanding and revealed that Omar Sawyer has to be pushed to instill his enteral nutrition and water and that she feels he is dehydrated.  Omar Sawyer was privy to this conversation and information relayed to him that he needs to stop the Lisinopril and Flomax and to instill the enteral nutrition as instructed by Omar Sawyer.  Omar Sawyer reported that he is receiving 4 cans of enteral nutrition followed by 1 cup of water after each feeding, but Omar Sawyer refuses more water, so instructed her to tell him he has too have at least 4 more cups (total 8) daily for hydration and she stated understanding.   Called [employer] and she agreed with above instructions and will follow-up with Omar Sawyer when he is assessed on 01/10/14.

## 2013-01-09 NOTE — Telephone Encounter (Signed)
Fax Received. Refill Completed. Arbor Cohen Chowoe (R.M.A)   

## 2013-01-11 ENCOUNTER — Other Ambulatory Visit (HOSPITAL_BASED_OUTPATIENT_CLINIC_OR_DEPARTMENT_OTHER): Payer: Medicare Other | Admitting: Lab

## 2013-01-11 ENCOUNTER — Ambulatory Visit (HOSPITAL_BASED_OUTPATIENT_CLINIC_OR_DEPARTMENT_OTHER): Payer: Medicare Other | Admitting: Oncology

## 2013-01-11 VITALS — BP 141/67 | HR 105 | Temp 98.4°F | Resp 18 | Ht 69.0 in | Wt 176.0 lb

## 2013-01-11 DIAGNOSIS — C099 Malignant neoplasm of tonsil, unspecified: Secondary | ICD-10-CM

## 2013-01-11 LAB — BASIC METABOLIC PANEL (CC13)
BUN: 30.2 mg/dL — ABNORMAL HIGH (ref 7.0–26.0)
CO2: 29 mEq/L (ref 22–29)
Chloride: 87 mEq/L — ABNORMAL LOW (ref 98–107)
Creatinine: 1.2 mg/dL (ref 0.7–1.3)

## 2013-01-11 LAB — CBC WITH DIFFERENTIAL/PLATELET
Basophils Absolute: 0 10*3/uL (ref 0.0–0.1)
EOS%: 1.4 % (ref 0.0–7.0)
HCT: 27.4 % — ABNORMAL LOW (ref 38.4–49.9)
HGB: 9.3 g/dL — ABNORMAL LOW (ref 13.0–17.1)
MCH: 30.7 pg (ref 27.2–33.4)
MONO#: 1.2 10*3/uL — ABNORMAL HIGH (ref 0.1–0.9)
NEUT%: 84.4 % — ABNORMAL HIGH (ref 39.0–75.0)
Platelets: 252 10*3/uL (ref 140–400)
lymph#: 0.4 10*3/uL — ABNORMAL LOW (ref 0.9–3.3)

## 2013-01-11 LAB — TECHNOLOGIST REVIEW

## 2013-01-11 MED ORDER — FENTANYL 50 MCG/HR TD PT72: 1.0000 | MEDICATED_PATCH | TRANSDERMAL | Status: DC

## 2013-01-11 NOTE — Progress Notes (Signed)
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   PAST THERAPY:  concurrent chemo with q3 weeks Cisplatin and daily radiation between 11/13/2012 and 12/29/2012. His 3rd and last dose of chemo was canceled due to poor toleration.    CURRENT THERAPY:  Watchful observation.   INTERVAL HISTORY: Omar Sawyer 72 y.o. male returns for regular follow up with his wife.  He had one episode of syncope earlier this week.  He denied chest pain, SOB, dizziness, seizure.  He had gone for a long day without feeding.  He has not had recurrent syncope.  He still has mucositis; but it is slightly less than before.  He is on Fentanyl patch 51mcg/hr and requires breakthrough Lortab about 3-4x/day.  His strength is now better than before.  He is able to ambulate to the mailbox which is a long walk. He is starting to have taste for mechanical soft foods such as apple sauce.  He denied tinnitus, but he has some hearing loss.  He denied palpable node in the neck.  He denied PEG tube pain, erythema or discharge.  The rest of the 14-point review of system was negative.    Past Medical History  Diagnosis Date  . Essential hypertension, benign   . Mixed hyperlipidemia   . Unspecified sinusitis (chronic)   . Edema   . Personal history of adenomatous colonic polyps 07/23/2012    2010  . Hearing loss   . Hx of prostatitis   . 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 Anxiety     mild new dx  . GERD (gastroesophageal reflux disease)   . Neuromuscular disorder     b/l tremors,   . Basal cell carcinoma   . Mucositis 12/04/2012  . Renal insufficiency 12/16/2012    Past Surgical History  Procedure Laterality Date  .  Colonoscopy  2011    polyps. Dr. Leone Payor.  Due now 2013  . Biopsy of right tonsil Right 10/03/2012    Squamous Cell Carcinoma     . Blepharoplasty      Current Outpatient Prescriptions  Medication Sig Dispense Refill  . Alum & Mag Hydroxide-Simeth (MAGIC MOUTHWASH W/LIDOCAINE) SOLN Swish and swallow 5 mLs 4 (four) times daily.      . cholecalciferol (VITAMIN D) 1000 UNITS tablet Take 1,000 Units by mouth daily.      Marland Kitchen docusate sodium 100 MG CAPS Take 100 mg by mouth 2 (two) times daily.  10 capsule    . fentaNYL (DURAGESIC - DOSED MCG/HR) 50 MCG/HR Place 1 patch (50 mcg total) onto the skin every 3 (three) days.  10 patch  0  . fluconazole (DIFLUCAN) 100 MG tablet Take 1 tablet (100 mg total) by mouth daily.  11 tablet  0  . HYDROcodone-acetaminophen (HYCET) 7.5-325 mg/15 ml solution Take 15 mLs by mouth every 4 (four) hours as needed for pain.  473 mL  3  . lactose free nutrition (BOOST PLUS) LIQD Take 237 mLs by mouth 4 (four) times daily -  with meals and at bedtime.      Marland Kitchen LORazepam (ATIVAN) 0.5 MG tablet Take 1 tablet (0.5 mg total)  by mouth every 6 (six) hours as needed (anticipation nausea or vomiting).  30 tablet  1  . Nutritional Supplements (FEEDING SUPPLEMENT, JEVITY 1.2 CAL,) LIQD Place 237 mLs into feeding tube 4 (four) times daily.      Marland Kitchen scopolamine (TRANSDERM-SCOP) 1.5 MG Place 1 patch (1.5 mg total) onto the skin every 3 (three) days. To dry up saliva.  10 patch  0  . sodium fluoride (FLUORISHIELD) 1.1 % GEL dental gel Brush and floss. Instill one drop of fluoride into fluoride tray. Place over teeth for 5 minutes. Remove. Spit out excess. Repeat nightly  120 mL  prn  . Water For Irrigation, Sterile (FREE WATER) SOLN Place 60 mLs into feeding tube 4 (four) times daily.      Marland Kitchen lisinopril (PRINIVIL,ZESTRIL) 20 MG tablet       . metoprolol succinate (TOPROL-XL) 50 MG 24 hr tablet Take 75 mg by mouth daily before breakfast.       . omeprazole (PRILOSEC) 20 MG capsule Take 20 mg by  mouth daily.       . ondansetron (ZOFRAN) 8 MG tablet Take 1 tablet (8 mg total) by mouth every 12 (twelve) hours as needed for nausea. Take two times a day as needed for nausea or vomiting starting on the third day after chemotherapy.  30 tablet  1  . potassium chloride (KLOR-CON 10) 10 MEQ tablet Take 1 tablet (10 mEq total) by mouth daily.  30 tablet  5  . prochlorperazine (COMPAZINE) 10 MG tablet Take 10 mg by mouth every 6 (six) hours as needed (for nausea/vomiting).      . Tamsulosin HCl (FLOMAX) 0.4 MG CAPS Take 0.4 mg by mouth daily.        No current facility-administered medications for this visit.    ALLERGIES:  has No Known Allergies.   Filed Vitals:   01/11/13 1415  BP: 141/67  Pulse: 105  Temp: 98.4 F (36.9 C)  Resp: 18   Wt Readings from Last 3 Encounters:  01/11/13 176 lb (79.833 kg)  12/29/12 176 lb 6.4 oz (80.015 kg)  12/25/12 180 lb 14.4 oz (82.056 kg)   ECOG Performance status: 1  PHYSICAL EXAMINATION:   General:  well-nourished man, in no acute distress.  Eyes:  no scleral icterus.  ENT:  There were no oropharyngeal lesions.  Neck was without thyromegaly.  Lymphatics:  Negative cervical, supraclavicular or axillary adenopathy.  Respiratory: lungs were clear bilaterally without wheezing or crackles.  Cardiovascular:  Regular rate and rhythm, S1/S2, without murmur, rub or gallop.  There was no pedal edema.  GI:  abdomen was soft, flat, nontender, nondistended, without organomegaly. PEG tube was dry clean intact.  Muscoloskeletal:  no spinal tenderness of palpation of vertebral spine.  Skin exam was without echymosis, petichae.  Neuro exam was nonfocal.  Patient was able to get on and off exam table without assistance.  Gait was normal.  Patient was alerted and oriented.  Attention was good.   Language was appropriate.  Mood was normal without depression.  Speech was not pressured.  Thought content was not tangential.      LABORATORY/RADIOLOGY DATA:  Lab Results    Component Value Date   WBC 11.4* 01/11/2013   HGB 9.3* 01/11/2013   HCT 27.4* 01/11/2013   PLT 252 01/11/2013   GLUCOSE 143* 01/11/2013   CHOL 112 06/23/2012   TRIG 103.0 06/23/2012   HDL 33.60* 06/23/2012   LDLCALC 58 06/23/2012   ALKPHOS 74 12/13/2012  ALT 31 12/13/2012   AST 17 12/13/2012   NA 124* 01/11/2013   K 4.7 01/11/2013   CL 87* 01/11/2013   CREATININE 1.2 01/11/2013   BUN 30.2* 01/11/2013   CO2 29 01/11/2013   INR 1.09 11/07/2012    ASSESSMENT AND PLAN:   1.  Diagnosis:  Head and neck cancer. 2.  Treatment:  He recently finished concurrent chemo radiation with every 3 weeks Cisplatin and daily radiation. 3.  Mucositis:  He has fentanyl patch at 50 mvh per hour.  He was on 75 before.  I recommended to him and his wife that we should slowly taper it off over the next 3 months. He still has breakthrough pain Lortab elixir.  4.  To prevent esophageal stricture:  I advised him to continue swallowing exercises more routinely.  5.   moderate calorie protein malnutrition: He is taking about 7 cans of Boost a day.  I advised him to keep that up but also to start eating mechanical soft foods.   9.  renal insufficiency: Improved creatinine to 1.2. I advised him to continue with at least 60-oz of fluid daily.    Follow up:  In about 2 months to ensure he is doing well.  Restaging PET scan in about 3-4 months from now.   I informed him and his wife that I'm leaving the practice.  [facility] to see a new provider when he returns.     The length of time of the face-to-face encounter was 15 minutes. More than 50% of time was spent counseling and coordination of care.      Jessye Imhoff T. Gaylyn Rong, M.D.

## 2013-01-12 ENCOUNTER — Telehealth: Payer: Self-pay | Admitting: Oncology

## 2013-01-26 ENCOUNTER — Ambulatory Visit
Admission: RE | Admit: 2013-01-26 | Discharge: 2013-01-26 | Disposition: A | Discharge status: Home / Self Care | Payer: Medicare Other | Source: Ambulatory Visit | Attending: Radiation Oncology | Admitting: Radiation Oncology

## 2013-01-26 VITALS — BP 145/63 | HR 73 | Temp 98.3°F | Wt 180.8 lb

## 2013-01-26 DIAGNOSIS — C099 Malignant neoplasm of tonsil, unspecified: Secondary | ICD-10-CM

## 2013-01-26 MED ORDER — FENTANYL 25 MCG/HR TD PT72: 1.0000 | MEDICATED_PATCH | TRANSDERMAL | Status: DC

## 2013-01-26 NOTE — Progress Notes (Signed)
Patient for routine 3 week follow up completion of radiation to bilateral neck on 12/29/12.Pain increases with talking, but only 4 or 5 on scale of 0 to 10.Marland KitchenContinues on jevity 6 to 7 times daily.Nausea in morning.Informed ok to take lorazepam sublingual for this.Weight maintained, actuall has increased from 176.0 lbs to 180.8 lbs.Skin has healed, no peeling just mild discoloration.

## 2013-01-26 NOTE — Progress Notes (Signed)
Radiation Oncology         [phone] ________________________________  Name: Omar Sawyer MRN: [id number]  Date: 01/26/2013  DOB: [birth year]  Follow-Up Visit Note  CC: [name],[name], MD  Suzanna Obey, MD  Diagnosis: Clinical T2 N2b M0 squamous cell carcinoma of the right tonsil  Indication for treatment: curative  Radiation treatment dates: 11/13/2012-12/29/2012  Site/dose: Right tonsil/ bilateral neck/ total dose 70 Gy in 35 fractions  Narrative:  The patient returns today for routine follow-up.    Pain increases with talking, but only 4 or 5 on scale of 0 to 10.Marland KitchenContinues on jevity 6 to 7 times daily. Nausea in morning. Nursing informed ok to take lorazepam sublingual for this. Weight maintained, actually has increased from 176.0 lbs to 180.8 lbs. Skin has healed, no peeling just mild discoloration. Patient and wife are pleased with his progress.                            ALLERGIES:  has No Known Allergies.  Meds: Current Outpatient Prescriptions  Medication Sig Dispense Refill  . fentaNYL (DURAGESIC - DOSED MCG/HR) 50 MCG/HR Place 1 patch (50 mcg total) onto the skin every 3 (three) days.  10 patch  0  . HYDROcodone-acetaminophen (HYCET) 7.5-325 mg/15 ml solution Take 15 mLs by mouth every 4 (four) hours as needed for pain.  473 mL  3  . metoprolol succinate (TOPROL-XL) 50 MG 24 hr tablet Take 75 mg by mouth daily before breakfast.       . Nutritional Supplements (FEEDING SUPPLEMENT, JEVITY 1.2 CAL,) LIQD Place 237 mLs into feeding tube 4 (four) times daily.      . sodium fluoride (FLUORISHIELD) 1.1 % GEL dental gel Brush and floss. Instill one drop of fluoride into fluoride tray. Place over teeth for 5 minutes. Remove. Spit out excess. Repeat nightly  120 mL  prn  . Water For Irrigation, Sterile (FREE WATER) SOLN Place 60 mLs into feeding tube 4 (four) times daily.      . Alum & Mag Hydroxide-Simeth (MAGIC MOUTHWASH W/LIDOCAINE) SOLN Swish and swallow 5 mLs 4 (four) times daily.       . cholecalciferol (VITAMIN D) 1000 UNITS tablet Take 1,000 Units by mouth daily.      Marland Kitchen docusate sodium 100 MG CAPS Take 100 mg by mouth 2 (two) times daily.  10 capsule    . fentaNYL (DURAGESIC - DOSED MCG/HR) 25 MCG/HR Place 1 patch (25 mcg total) onto the skin every 3 (three) days. Use when ready to taper from 50 mcg patch.  3 patch  0  . fluconazole (DIFLUCAN) 100 MG tablet Take 1 tablet (100 mg total) by mouth daily.  11 tablet  0  . lactose free nutrition (BOOST PLUS) LIQD Take 237 mLs by mouth 4 (four) times daily -  with meals and at bedtime.      Marland Kitchen lisinopril (PRINIVIL,ZESTRIL) 20 MG tablet       . LORazepam (ATIVAN) 0.5 MG tablet Take 1 tablet (0.5 mg total) by mouth every 6 (six) hours as needed (anticipation nausea or vomiting).  30 tablet  1  . omeprazole (PRILOSEC) 20 MG capsule Take 20 mg by mouth daily.       . ondansetron (ZOFRAN) 8 MG tablet Take 1 tablet (8 mg total) by mouth every 12 (twelve) hours as needed for nausea. Take two times a day as needed for nausea or vomiting starting on the third day after  chemotherapy.  30 tablet  1  . potassium chloride (KLOR-CON 10) 10 MEQ tablet Take 1 tablet (10 mEq total) by mouth daily.  30 tablet  5  . prochlorperazine (COMPAZINE) 10 MG tablet Take 10 mg by mouth every 6 (six) hours as needed (for nausea/vomiting).      Marland Kitchen scopolamine (TRANSDERM-SCOP) 1.5 MG Place 1 patch (1.5 mg total) onto the skin every 3 (three) days. To dry up saliva.  10 patch  0  . Tamsulosin HCl (FLOMAX) 0.4 MG CAPS Take 0.4 mg by mouth daily.        No current facility-administered medications for this encounter.    Physical Findings: The patient is in no acute distress. Patient is alert and oriented.  weight is 180 lb 12.8 oz (82.01 kg). His temperature is 98.3 F (36.8 C). His blood pressure is 145/63 and his pulse is 73. His oxygen saturation is 100%. .  No thrush in oral cavity.  No significant mucosal irritation.  Skin - mild discoloration, no peeling.    Lab Findings: Lab Results  Component Value Date   WBC 11.4* 01/11/2013   HGB 9.3* 01/11/2013   HCT 27.4* 01/11/2013   MCV 90.5 01/11/2013   PLT 252 01/11/2013    CMP     Component Value Date/Time   NA 124* 01/11/2013 1357   NA 130* 12/16/2012 0402   K 4.7 01/11/2013 1357   K 3.9 12/16/2012 0402   CL 87* 01/11/2013 1357   CL 97 12/16/2012 0402   CO2 29 01/11/2013 1357   CO2 24 12/16/2012 0402   GLUCOSE 143* 01/11/2013 1357   GLUCOSE 99 12/16/2012 0402   BUN 30.2* 01/11/2013 1357   BUN 38* 12/16/2012 0402   CREATININE 1.2 01/11/2013 1357   CREATININE 2.34* 12/16/2012 0402   CALCIUM 8.9 01/11/2013 1357   CALCIUM 7.2* 12/16/2012 0402   PROT 7.1 12/13/2012 0952   PROT 7.5 06/23/2012 1037   ALBUMIN 3.2* 12/13/2012 0952   ALBUMIN 3.9 06/23/2012 1037   AST 17 12/13/2012 0952   AST 24 06/23/2012 1037   ALT 31 12/13/2012 0952   ALT 35 06/23/2012 1037   ALKPHOS 74 12/13/2012 0952   ALKPHOS 53 06/23/2012 1037   BILITOT 0.58 12/13/2012 0952   BILITOT 0.4 06/23/2012 1037   GFRNONAA 26* 12/16/2012 0402   GFRAA 30* 12/16/2012 0402     Radiographic Findings: No results found.  Impression/Plan:    1) Head and Neck Cancer Status: recovering well  2) Nutritional Status: - weight:rising - PEG tube:still using   3) Risk Factors: The patient has been educated about risk factors including alcohol and tobacco abuse; they understand that avoidance of alcohol and tobacco is important to prevent recurrences as well as other cancers  4) Swallowing:encoured increased swallowing of soft foods/liquids to prevent chronic dysphagia  5) Dental: Encouraged to continue regular followup with dentistry, and dental hygiene including fluoride rinses.   6) Energy: reasonable at this time  7) Social: No active social issues to address at this time  8) Follow-up in 3 months with PET/CT at that time. The patient was encouraged to call with any issues or questions before then.  I spent 20 minutes minutes face to face  with the patient and more than 50% of that time was spent in counseling and/or coordination of care. _____________________________________   Lonie Peak, MD

## 2013-01-29 ENCOUNTER — Ambulatory Visit: Payer: Medicare Other

## 2013-01-29 ENCOUNTER — Ambulatory Visit: Payer: Medicare Other | Attending: Radiation Oncology

## 2013-01-29 ENCOUNTER — Encounter: Payer: Self-pay | Admitting: Radiation Oncology

## 2013-01-29 DIAGNOSIS — R1311 Dysphagia, oral phase: Secondary | ICD-10-CM | POA: Insufficient documentation

## 2013-01-29 DIAGNOSIS — IMO0001 Reserved for inherently not codable concepts without codable children: Secondary | ICD-10-CM | POA: Insufficient documentation

## 2013-01-30 ENCOUNTER — Ambulatory Visit (HOSPITAL_COMMUNITY): Payer: Self-pay | Admitting: Dentistry

## 2013-01-30 ENCOUNTER — Telehealth: Payer: Self-pay | Admitting: *Deleted

## 2013-01-30 ENCOUNTER — Encounter (HOSPITAL_COMMUNITY): Payer: Self-pay | Admitting: Dentistry

## 2013-01-30 VITALS — BP 144/71 | HR 69 | Temp 97.8°F | Wt 180.0 lb

## 2013-01-30 DIAGNOSIS — C099 Malignant neoplasm of tonsil, unspecified: Secondary | ICD-10-CM

## 2013-01-30 DIAGNOSIS — R1310 Dysphagia, unspecified: Secondary | ICD-10-CM

## 2013-01-30 DIAGNOSIS — R432 Parageusia: Secondary | ICD-10-CM

## 2013-01-30 DIAGNOSIS — Z09 Encounter for follow-up examination after completed treatment for conditions other than malignant neoplasm: Secondary | ICD-10-CM

## 2013-01-30 DIAGNOSIS — K117 Disturbances of salivary secretion: Secondary | ICD-10-CM

## 2013-01-30 DIAGNOSIS — Z0189 Encounter for other specified special examinations: Secondary | ICD-10-CM

## 2013-01-30 MED ORDER — SODIUM FLUORIDE 1.1 % DT CREA: TOPICAL_CREAM | DENTAL | Status: AC

## 2013-01-30 NOTE — Progress Notes (Signed)
01/30/2013  Patient:            Omar Sawyer Date of Birth:  December 03, 1940 MRN:                [id number]  BP 144/71  Pulse 69  Temp(Src) 97.8 F (36.6 C) (Oral)  Wt 180 lb (81.647 kg)  BMI 26.57 kg/m2  Maureen Ralphs presents for periodic oral examination after radiation therapy. Patient was diagnosed with squamous cell carcinoma of the right tonsil.  Patient with chemoradiation therapy from 11/13/12 thru 12/29/2012.  REVIEW OF CHIEF COMPLAINTS: DRY MOUTH: Yes HARD TO SWALLOW: Yes  HURT TO SWALLOW: No TASTE CHANGES: Taste loss is resolving SORES IN MOUTH: No TRISMUS: No problems doing trismus exercises. No trismus symptoms to date. WEIGHT: 180 pounds  HOME OH REGIMEN:  BRUSHING: 3-4 times a day FLOSSING: At bedtime RINSING: Using salt water rinses and Biotene rinse FLUORIDE: At bedtime "when he remembers". Use of fluoride was strongly encouraged to prevent radiation caries. Patient was given a prescription for PreviDent 5000+ to use at bedtime as an alternative to the fluoride in the fluoride trays to increase compliance. TRISMUS EXERCISES:  Maximum interincisal opening: 40 mm   DENTAL EXAM:  Oral Hygiene:(PLAQUE): Good oral hygiene noted to LOCATION OF MUCOSITIS:  none noted DESCRIPTION OF SALIVA:  Decreased saliva. Mild to moderate xerostomia. ANY EXPOSED BONE:  none noted OTHER WATCHED AREAS:  Teeth in the primary field of radiation therapy to include tooth numbers 1, 2, 3, 15,19,30,31, and 32. DX:  1. Xerostomia 2. Dysgeusia-resolving 3. Dysphagia  RECOMMENDATIONS: 1. Brush after meals and at bedtime.  Use either fluoride in trays or Prevident 5000 Plus on tooth brush at bedtime. 2. Use trismus exercises as directed. 3. Use Biotene Rinse or salt water/baking soda rinses. 4. Multiple sips of water as needed. 5. Return to his primary dentist for periodontal recall in 2-3 months. Call if problems before then.  Charlynne Pander, DDS

## 2013-01-30 NOTE — Patient Instructions (Signed)
RADIATION THERAPY AND DECISIONS REGARDING YOUR TEETH  Xerostomia (dry mouth) Your salivary glands may be in the filed of radiation.  Radiation may include all or part of your saliva glands.  This will cause your saliva to dry up and you will have a dry mouth.  The dry mouth will be for the rest of your life unless your radiation oncologist tells you otherwise.  Your saliva has many functions:  Saliva wets your tongue for speaking.  It coats your teeth and the inside of your mouth for easier movement.  It helps with chewing and swallowing food.  It helps clean away harmful acid and toxic products made by the germs in your mouth, therefore it helps prevent cavities.  It kills some germs in your mouth and helps to prevent gum disease.  It helps to carry flavor to your taste buds.  Once you have lost your saliva you will be at higher risk for tooth decay and gum disease.  What can be done to help improve your mouth when there's not enough saliva:  1.  Your dentist may give a prescription for Salagen.  It will not bring back all of your saliva but may bring back some of it.  Also your saliva may be thick and ropy or white and foamy. It will not feel like it use to feel.  2.  You will need to swish with water every time your mouth feels dry.  YOU CANNOT suck on any cough drops, mints, lemon drops, candy, vitamin C or any other products.  You cannot use anything other than water to make your mouth feel less dry.  If you want to drink anything else you have to drink it all at once and brush afterwards.  Be sure to discuss the details of your diet habits with your dentist or hygienist.  Radiation caries: This is decay that happens very quickly once your mouth is very dry due to radiation therapy.  Normally cavities take six months to two years to become a problem.  When you have dry mouth cavities may take as little as eight weeks to cause you a problem.  This is why dental check ups every two  months are necessary as long as you have a dry mouth. Radiation caries typically, but not always, start at your gum line where it is hard to see the cavity.  It is therefore also hard to fill these cavities adequately.  This high rate of cavities happens because your mouth no longer has saliva and therefore the acid made by the germs starts the decay process.  Whenever you eat anything the germs in your mouth change the food into acid.  The acid then burns a small hole in your tooth.  This small hole is the beginning of a cavity.  If this is not treated then it will grow bigger and become a cavity.  The way to avoid this hole getting bigger is to use fluoride every evening as prescribed by your dentist.  You have to make sure that your teeth are very clean before you use the fluoride.  This fluoride in turn will strengthen your teeth and prepare them for another day of fighting acid.  If you develop radiation caries many times the damage is so large that you will have to have all your teeth removed.  This could be a big problem if some of these teeth are in the field of radiation.  Further details of why this could be   a big problem will follow.  (See Osteoradionecrosis).  Loss of taste (dysgeusia) This happens to varying degrees once you've had radiation therapy to your jaw region.  Many times taste is not completely lost but becomes limited.  The loss of taste is mostly due to radiation affecting your taste buds.  However if you have no saliva in your mouth to carry the flavor to your taste buds it would be difficult for your taste buds to taste anything.  That is why using water or a prescription for Salagen prior to meals and during meals may help with some of the taste.  Keep in mind that taste generally returns very slowly over the course of several months or several years after radiation therapy.  Don't give up hope.  Trismus According to your Radiation Oncologist your TMJ or jaw joints are going to be  partially or fully in the field of radiation.  This means that over time the muscles that help you open and close your mouth may get stiff.  This will potentially result in your not being able to open your mouth wide enough or as wide as you can open it now.  Le me give you an example of how slowly this happens and how unaware people are of it.  A gentlemen that had radiation therapy two years ago came back to me complaining that bananas are just too large for him to be able to fit them in between his teeth.  He was not able to open wide enough to bite into a banana.  This happens slowly and over a period of time.  What do we do to try and prevent this?  Your dentist will probably give you a stack of sticks called a trismus exercise device .  This stack will help your remind your muscles and your jaw joint to open up to the same distance every day.  Use these sticks every morning when you wake up according to the instructions given by the dentist.   You must use these sticks for at least one to two years after radiation therapy.  The reason for that is because it happens so slowly and keeps going on for about two years after radiation therapy.  Your hospital dentist will help you monitor your mouth opening and make sure that it's not getting smaller.  Osteoradionecrosis (ORN) This is a condition where your jaw bone after having had radiation therapy becomes very dry.  It has very little blood supply to keep it alive.  If you develop a cavity that turns into an abscess or an infection then the jaw bone does not have enough blood supply to help fight the infection.  At this point it is very likely that the infection could cause the death of your jaw bone.  When you have dead bone it has to be removed.  Therefore you might end up having to have surgery to remove part of your jaw bone, the part of the jaw bone that has been affected.   Healing is also a problem if you are to have surgery in the areas where the bone  has had radiation therapy.  The same reasons apply.  If you have surgery you need more blood supply which is not available.  When blood supply and oxygen are not available again, there is a chance for the bone to die.  Occasionally ORN happens on its own with no obvious reason.  This is quite rare.  We believe that   patients who continue to smoke and/or drink alcohol have a higher chance of having this bone problem.  Therefore once your jaw bone has had radiation therapy if there are any teeth in that area, you should never have them pulled.  You should also never have any surgery on your teeth or gums in that area unless the oral surgeon or Periodontist is aware of your history of radiation. There is some expensive management techniques that might be used to limit your risks.  The risks for ORN either from infection or spontaneous ( or on it's own) are life long.    TRISMUS  Trismus is a condition where the jaw does not allow the mouth to open as wide as it usually does.  This can happen almost suddenly, or in other cases the process is so slow, it is hard to notice it-until it is too far along.  When the jaw joints and/or muscles have been exposed to radiation treatments, the onset of Trismus is very slow.  This is because the muscles are losing their stretching ability over a long period of time, as long as 2 YEARS after the end of radiation.  It is therefore important to exercise these muscles and joints.  TRISMUS EXERCISES   Stack of tongue depressors measuring the same or a little less than the last documented MIO (Maximum Interincisal Opening).  Secure them with a rubber band on both ends.  Place the stack in the patient's mouth, supporting the other end.  Allow 30 seconds for muscle stretching.  Rest for a few seconds.  Repeat 3-5 times  For all radiation patients, this exercise is recommended in the mornings and evenings unless otherwise instructed.  The exercise should be done for  a period of 2 YEARS after the end of radiation.  MIO should be checked routinely on recall dental visits by the general dentist or the hospital dentist.  The patient is advised to report any changes, soreness, or difficulties encountered when doing the exercises.  FLUORIDE TRAYS PATIENT INSTRUCTIONS    Obtain prescription from the pharmacy.  Don't be surprised if it needs to be ordered.   Be sure to let the pharmacy know when you are close to needing a new refill for them to have it ready for you without interruption of Fluoride use.   The best time to use your Fluoride is before bed time.   You must brush your teeth very well and floss before using the Fluoride in order to get the best use out of the Fluoride treatments.   Place 1 drop of Fluoride gel per tooth in the tray.   Place the tray on your lower teeth and/or your upper teeth.  Make sure the trays are seated all the way.  Remember, they only fit one way on your teeth.   Insert for 5 full minutes.   At the end of the 5 minutes, take the trays out.  SPIT OUT excess. .    Do NOT rinse your mouth!    Do NOT eat or drink after treatments for at least 30 minutes.  This is why the best time for your treatments is before bedtime.    Clean the inside of your Fluoride trays using COLD WATER and a toothbrush.    In order to keep your Trays from discoloring and free from odors, soak them overnight in denture cleaners such as Efferdent.  Do not use bleach or non denture products.    Store the trays   in a safe dry place AWAY from any heat until your next treatment.    Bring the trays with you for your next dental check-up.  The dentist will confirm their fit.    If anything happens to your Fluoride trays, or they don't fit as well after any dental work, please let us know as soon as possible. 

## 2013-01-30 NOTE — Telephone Encounter (Signed)
CALLED PATIENT TO INFORM OF TEST AND FU VISIT, LVM FOR A RETURN CALL 

## 2013-02-07 ENCOUNTER — Other Ambulatory Visit: Payer: Self-pay | Admitting: Radiation Oncology

## 2013-02-07 ENCOUNTER — Telehealth: Payer: Self-pay | Admitting: *Deleted

## 2013-02-07 NOTE — Telephone Encounter (Signed)
Omar Sawyer called and notified  that a refill has been called to [facility], on [address], for his HYCET 7.5-325 mg/15 ml.  Total 473 ml with 1 Refill.  Order per Dr. Lonie Peak.

## 2013-02-20 ENCOUNTER — Ambulatory Visit (HOSPITAL_COMMUNITY): Payer: Self-pay | Admitting: Dentistry

## 2013-02-28 ENCOUNTER — Telehealth: Payer: Self-pay | Admitting: *Deleted

## 2013-02-28 NOTE — Telephone Encounter (Signed)
Returned patient's call regarding presence of nausea, but had to leave a voice message with instructions to please call back.

## 2013-03-05 ENCOUNTER — Ambulatory Visit: Payer: Medicare Other | Attending: Radiation Oncology

## 2013-03-05 ENCOUNTER — Ambulatory Visit: Payer: Medicare Other

## 2013-03-05 DIAGNOSIS — R1311 Dysphagia, oral phase: Secondary | ICD-10-CM | POA: Insufficient documentation

## 2013-03-05 DIAGNOSIS — IMO0001 Reserved for inherently not codable concepts without codable children: Secondary | ICD-10-CM | POA: Insufficient documentation

## 2013-03-12 ENCOUNTER — Ambulatory Visit: Payer: Self-pay | Admitting: Cardiovascular Disease

## 2013-03-14 ENCOUNTER — Encounter: Payer: Self-pay | Admitting: Internal Medicine

## 2013-03-14 ENCOUNTER — Telehealth: Payer: Self-pay | Admitting: Oncology

## 2013-03-14 ENCOUNTER — Other Ambulatory Visit (HOSPITAL_BASED_OUTPATIENT_CLINIC_OR_DEPARTMENT_OTHER): Payer: Medicare Other | Admitting: Lab

## 2013-03-14 ENCOUNTER — Encounter: Payer: Self-pay | Admitting: Oncology

## 2013-03-14 ENCOUNTER — Ambulatory Visit (HOSPITAL_BASED_OUTPATIENT_CLINIC_OR_DEPARTMENT_OTHER): Payer: Medicare Other | Admitting: Oncology

## 2013-03-14 ENCOUNTER — Other Ambulatory Visit: Payer: Self-pay | Admitting: *Deleted

## 2013-03-14 VITALS — BP 152/76 | HR 72 | Temp 97.8°F | Resp 18 | Ht 69.0 in | Wt 180.2 lb

## 2013-03-14 DIAGNOSIS — C099 Malignant neoplasm of tonsil, unspecified: Secondary | ICD-10-CM

## 2013-03-14 LAB — CBC WITH DIFFERENTIAL/PLATELET
BASO%: 0.2 % (ref 0.0–2.0)
Basophils Absolute: 0 10*3/uL (ref 0.0–0.1)
HCT: 31.6 % — ABNORMAL LOW (ref 38.4–49.9)
HGB: 11.1 g/dL — ABNORMAL LOW (ref 13.0–17.1)
MCHC: 35 g/dL (ref 32.0–36.0)
MONO#: 0.5 10*3/uL (ref 0.1–0.9)
NEUT%: 77.8 % — ABNORMAL HIGH (ref 39.0–75.0)
RDW: 13.2 % (ref 11.0–14.6)
WBC: 5.6 10*3/uL (ref 4.0–10.3)
lymph#: 0.6 10*3/uL — ABNORMAL LOW (ref 0.9–3.3)

## 2013-03-14 LAB — BASIC METABOLIC PANEL (CC13)
CO2: 25 mEq/L (ref 22–29)
Chloride: 98 mEq/L (ref 98–109)
Creatinine: 1.4 mg/dL — ABNORMAL HIGH (ref 0.7–1.3)
Potassium: 4 mEq/L (ref 3.5–5.1)

## 2013-03-14 MED ORDER — FLUCONAZOLE 100 MG PO TABS: 100.0000 mg | ORAL_TABLET | Freq: Every day | ORAL | Status: DC

## 2013-03-14 NOTE — Progress Notes (Signed)
[facility]  Telephone:([phone] [phone]   OFFICE PROGRESS NOTE   Cc:  [name],[name], MD  DIAGNOSIS: cT2 N2b M0 right tonsil squamous cell carcinoma.   PAST THERAPY:  concurrent chemo with q3 weeks Cisplatin and daily radiation between 11/13/2012 and 12/29/2012. His 3rd and last dose of chemo was canceled due to poor toleration.    CURRENT THERAPY:  Watchful observation.   INTERVAL HISTORY: Omar Sawyer 72 y.o. male returns for regular follow up with his wife. Side effects pf treatment continue to improve. His strength is now better than before.  He is starting to have taste for mechanical soft foods such as apple sauce. Still putting 6 cans of Ensure or Boost through PEG tube. He is gaining back his lost weight.  He denied tinnitus, but he has some hearing loss.  He denied palpable node in the neck.  He denied PEG tube pain, erythema or discharge.  The rest of the 14-point review of system was negative.    Past Medical History  Diagnosis Date  . Essential hypertension, benign   . Mixed hyperlipidemia   . Unspecified sinusitis (chronic)   . Edema   . Personal history of adenomatous colonic polyps 07/23/2012    2010  . Hearing loss   . Hx of prostatitis   . 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 Anxiety     mild new dx  . GERD (gastroesophageal reflux disease)   . Neuromuscular disorder     b/l tremors,   . Basal cell carcinoma   . Mucositis 12/04/2012  . Renal insufficiency 12/16/2012    Past Surgical History  Procedure Laterality Date  . Colonoscopy  2011    polyps. Dr. Leone Payor.  Due now 2013  . Biopsy of right tonsil Right 10/03/2012    Squamous Cell Carcinoma     . Blepharoplasty      Current Outpatient Prescriptions  Medication Sig Dispense Refill  . fluconazole  (DIFLUCAN) 100 MG tablet Take 1 tablet (100 mg total) by mouth daily.  10 tablet  0  . lactose free nutrition (BOOST PLUS) LIQD Take 237 mLs by mouth 4 (four) times daily -  with meals and at bedtime.      . metoprolol succinate (TOPROL-XL) 50 MG 24 hr tablet Take 75 mg by mouth daily before breakfast.       . omeprazole (PRILOSEC) 20 MG capsule Take 20 mg by mouth daily.       . sodium fluoride (FLUORISHIELD) 1.1 % GEL dental gel Brush and floss. Instill one drop of fluoride into fluoride tray. Place over teeth for 5 minutes. Remove. Spit out excess. Repeat nightly  120 mL  prn  . sodium fluoride (PREVIDENT 5000 PLUS) 1.1 % CREA dental cream Apply thin ribbon to tooth brush. Brush teeth for 2 minutes. Spit out excess-DO NOT swallow. DO NOT rinse afterwards. Repeat nightly.  1 Tube  prn  . Water For Irrigation, Sterile (FREE WATER) SOLN Place 60 mLs into feeding tube 4 (four) times daily.       No current facility-administered medications for this visit.    ALLERGIES:  has No Known Allergies.   Filed Vitals:   03/14/13 1344  BP: 152/76  Pulse: 72  Temp: 97.8 F (  36.6 C)  Resp: 18   Wt Readings from Last 3 Encounters:  03/14/13 180 lb 3.2 oz (81.738 kg)  01/30/13 180 lb (81.647 kg)  01/26/13 180 lb 12.8 oz (82.01 kg)   ECOG Performance status: 1  PHYSICAL EXAMINATION:   General:  well-nourished man, in no acute distress.  Eyes:  no scleral icterus.  ENT:  Tongue with thrush. There were no oropharyngeal lesions.  Neck was without thyromegaly. Submental edema noted. Lymphatics:  Negative cervical, supraclavicular or axillary adenopathy.  Respiratory: lungs were clear bilaterally without wheezing or crackles.  Cardiovascular:  Regular rate and rhythm, S1/S2, without murmur, rub or gallop.  There was no pedal edema.  GI:  abdomen was soft, flat, nontender, nondistended, without organomegaly. PEG tube was dry clean intact.  Muscoloskeletal:  no spinal tenderness of palpation of vertebral  spine.  Skin exam was without echymosis, petichae.  Neuro exam was nonfocal.  Patient was able to get on and off exam table without assistance.  Gait was normal.  Patient was alerted and oriented.  Attention was good.   Language was appropriate.  Mood was normal without depression.  Speech was not pressured.  Thought content was not tangential.      LABORATORY/RADIOLOGY DATA:  Lab Results  Component Value Date   WBC 5.6 03/14/2013   HGB 11.1* 03/14/2013   HCT 31.6* 03/14/2013   PLT 168 03/14/2013   GLUCOSE 130 03/14/2013   CHOL 112 06/23/2012   TRIG 103.0 06/23/2012   HDL 33.60* 06/23/2012   LDLCALC 58 06/23/2012   ALKPHOS 74 12/13/2012   ALT 31 12/13/2012   AST 17 12/13/2012   NA 133* 03/14/2013   K 4.0 03/14/2013   CL 87* 01/11/2013   CREATININE 1.4* 03/14/2013   BUN 31.0* 03/14/2013   CO2 25 03/14/2013   INR 1.09 11/07/2012    ASSESSMENT AND PLAN:   1.  Diagnosis:  Head and neck cancer. 2.  Treatment:  S/P concurrent chemo radiation with every 3 weeks Cisplatin and daily radiation. PET scan is pending in August 2014. 3.  Mucositis:  Resolved. No longer on pain medication.  4.  To prevent esophageal stricture:  I advised him to continue swallowing exercises more routinely.  5.   moderate calorie protein malnutrition: He is taking about 6 cans of Boost or Ensure per day.  I advised him to take the Ensure by mouth and to increase the amount of mechanical soft foods. When he is taking all food by mouth and weight remains stable, we can have PEG tube removed. He was instructed to call us when he is ready to have this out. 6. Submental edema: I have referred him to the lymphedema clinic. 7. Oral candidiasis: I have prescribed Diflucan.  8.  renal insufficiency: Creatinine stable. I advised him to continue with at least 60-oz of fluid daily.    Follow up: After PET scan. I have encouraged him to keep follow-up with Rad Onc and to follow-up with ENT later this year.   The length of time of  the face-to-face encounter was 30 minutes. More than 50% of time was spent counseling and coordination of care.

## 2013-03-14 NOTE — Telephone Encounter (Signed)
gave pt for lab and MD  September 2014

## 2013-03-15 ENCOUNTER — Ambulatory Visit: Payer: Medicare Other | Admitting: Physical Therapy

## 2013-03-19 ENCOUNTER — Ambulatory Visit: Payer: Medicare Other | Admitting: Physical Therapy

## 2013-03-28 ENCOUNTER — Ambulatory Visit (INDEPENDENT_AMBULATORY_CARE_PROVIDER_SITE_OTHER): Payer: Medicare Other | Admitting: Physician Assistant

## 2013-03-28 ENCOUNTER — Encounter: Payer: Self-pay | Admitting: Physician Assistant

## 2013-03-28 VITALS — BP 143/82 | HR 57 | Ht 69.0 in | Wt 180.0 lb

## 2013-03-28 DIAGNOSIS — E785 Hyperlipidemia, unspecified: Secondary | ICD-10-CM

## 2013-03-28 DIAGNOSIS — C099 Malignant neoplasm of tonsil, unspecified: Secondary | ICD-10-CM

## 2013-03-28 DIAGNOSIS — I10 Essential (primary) hypertension: Secondary | ICD-10-CM

## 2013-03-28 NOTE — Progress Notes (Signed)
[address] [place], [place]  [address] Phone: [phone] Fax:  [phone]  Date:  03/28/2013   ID:  Omar Sawyer, DOB 1940-11-16, MRN [id number]  PCP:  Lynne Leader, MD  Cardiologist:  Dr. Delane Ginger     History of Present Illness: Omar Sawyer is a 72 y.o. male who returns for follow up.  He is followed by Dr. Elease Hashimoto for a history of hypertension and hyperlipidemia. 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 Follow up chest CTA:  No abnormal dilatation, aortic root 3.8 cm.  Last seen 08/2012. He has since been diagnosed with right tonsil squamous cell carcinoma. He has undergone chemotherapy with cisplatin and radiation between 10/2012 and 12/2012.  He was admitted in 4/14 with ARF in the setting of dehydration and chemotherapy with cisplatin. He was resuscitated with IV fluids and his creatinine did improve prior to discharge.  The patient denies chest pain, shortness of breath, syncope, orthopnea, PND or significant pedal edema.  He does have a PEG tube. He is started he again recently. He checks his blood pressures at home. Blood pressure ranges 130s/80s.  Of note, his lisinopril, HCTZ, amlodipine have all been discontinued. He is also off of his simvastatin.  Labs (10/13):  LDL 58 Labs (1/14):    K 3.7, Cr 1.2 Labs (4/14):    K 5.2, Cr 5.6, ALT 31 Labs (7/14):    K 4, Cr 1.4, Hgb 11.1  Wt Readings from Last 3 Encounters:  03/14/13 180 lb 3.2 oz (81.738 kg)  01/30/13 180 lb (81.647 kg)  01/26/13 180 lb 12.8 oz (82.01 kg)     Past Medical History  Diagnosis Date  . Essential hypertension, benign   . Mixed hyperlipidemia   . Unspecified sinusitis (chronic)   . Edema   . Personal history of adenomatous colonic polyps 07/23/2012    2010  . Hearing loss   . Hx of prostatitis   . 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 Anxiety     mild new dx  . GERD (gastroesophageal reflux disease)   . Neuromuscular disorder     b/l tremors,   . Basal cell carcinoma   . Mucositis 12/04/2012  . Renal insufficiency 12/16/2012    Current Outpatient Prescriptions  Medication Sig Dispense Refill  . metoprolol succinate (TOPROL-XL) 50 MG 24 hr tablet Take 75 mg by mouth daily before breakfast.       . tamsulosin (FLOMAX) 0.4 MG CAPS Take 0.4 mg by mouth daily.      . fluconazole (DIFLUCAN) 100 MG tablet Take 1 tablet (100 mg total) by mouth daily.  10 tablet  0  . lactose free nutrition (BOOST PLUS) LIQD Take 237 mLs by mouth 4 (four) times daily -  with meals and at bedtime.      Marland Kitchen omeprazole (PRILOSEC) 20 MG capsule Take 20 mg by mouth daily.       . sodium fluoride (FLUORISHIELD) 1.1 % GEL dental gel Brush and floss. Instill one drop of fluoride into fluoride tray. Place over teeth for 5 minutes. Remove. Spit out excess. Repeat nightly  120 mL  prn  . sodium fluoride (PREVIDENT 5000 PLUS)  1.1 % CREA dental cream Apply thin ribbon to tooth brush. Brush teeth for 2 minutes. Spit out excess-DO NOT swallow. DO NOT rinse afterwards. Repeat nightly.  1 Tube  prn  . Water For Irrigation, Sterile (FREE WATER) SOLN Place 60 mLs into feeding tube 4 (four) times daily.       No current facility-administered medications for this visit.    Allergies:   No Known Allergies  Social History:  The patient  reports that he has never smoked. He has never used smokeless tobacco. He reports that he does not drink alcohol or use illicit drugs.   ROS:  Please see the history of present illness.      All other systems reviewed and negative.   PHYSICAL EXAM: VS:  BP 143/82  Pulse 57  Ht 5\' 9"  (1.753 m)  Wt 180 lb (81.647 kg)  BMI 26.57 kg/m2 Well nourished, well developed, in no acute distress HEENT:  normal Neck: no JVD Vascular: No carotid bruits Cardiac:  normal S1, S2; RRR; no murmur Lungs:  clear to auscultation bilaterally, no wheezing, rhonchi or rales Abd: soft, nontender, no hepatomegaly Ext: no edema Skin: warm and dry Neuro:  CNs 2-12 intact, no focal abnormalities noted  EKG:  Sinus bradycardia, HR 57, normal axis, no acute changes     ASSESSMENT AND PLAN:  1. Hypertension:  Controlled.  He checks his blood pressure regularly at home. If he develops readings over 150/90 consistently, he knows to call. At that point, I would recommend restarting Norvasc. 2. Hyperlipidemia: I suspect there was some concern for myopathy as his statin was held during chemotherapy.  He may restart this as soon as oncology clears him.  I suspect his poor diet has maintained his lipids for now.  I would recommend f/u lipids and LFTs 2-3 mos after restarting. 3. Tonsillar CA:  F/u with oncology as planned. 4. Disposition:  F/u with Dr. Delane Ginger in 6 mos.   Signed, Tereso Newcomer, PA-C  03/28/2013 2:26 PM

## 2013-03-28 NOTE — Patient Instructions (Addendum)
PLEASE FOLLOW UP WITH DR. Elease Hashimoto IN 6 MONTHS  NO CHANGES WERE MADE TODAY

## 2013-04-02 ENCOUNTER — Ambulatory Visit: Payer: Medicare Other

## 2013-04-04 ENCOUNTER — Other Ambulatory Visit: Payer: Self-pay

## 2013-04-09 ENCOUNTER — Ambulatory Visit: Payer: Medicare Other

## 2013-04-10 ENCOUNTER — Ambulatory Visit: Payer: Medicare Other | Attending: Radiation Oncology

## 2013-04-10 DIAGNOSIS — R1311 Dysphagia, oral phase: Secondary | ICD-10-CM | POA: Insufficient documentation

## 2013-04-10 DIAGNOSIS — IMO0001 Reserved for inherently not codable concepts without codable children: Secondary | ICD-10-CM | POA: Insufficient documentation

## 2013-04-23 ENCOUNTER — Encounter (HOSPITAL_COMMUNITY)
Admission: RE | Admit: 2013-04-23 | Discharge: 2013-04-23 | Disposition: A | Discharge status: Home / Self Care | Payer: Medicare Other | Source: Ambulatory Visit | Attending: Radiation Oncology | Admitting: Radiation Oncology

## 2013-04-23 DIAGNOSIS — I517 Cardiomegaly: Secondary | ICD-10-CM | POA: Insufficient documentation

## 2013-04-23 DIAGNOSIS — C770 Secondary and unspecified malignant neoplasm of lymph nodes of head, face and neck: Secondary | ICD-10-CM | POA: Insufficient documentation

## 2013-04-23 DIAGNOSIS — C099 Malignant neoplasm of tonsil, unspecified: Secondary | ICD-10-CM | POA: Insufficient documentation

## 2013-04-23 MED ORDER — FLUDEOXYGLUCOSE F - 18 (FDG) INJECTION
15.8000 | Freq: Once | INTRAVENOUS | Status: AC | PRN
  Administered 2013-04-23: 15.8 via INTRAVENOUS

## 2013-04-24 ENCOUNTER — Ambulatory Visit: Payer: Medicare Other | Admitting: Physical Therapy

## 2013-04-26 ENCOUNTER — Encounter: Payer: Self-pay | Admitting: Radiation Oncology

## 2013-04-27 ENCOUNTER — Ambulatory Visit: Payer: Medicare Other

## 2013-04-27 ENCOUNTER — Ambulatory Visit
Admission: RE | Admit: 2013-04-27 | Discharge: 2013-04-27 | Disposition: A | Discharge status: Home / Self Care | Payer: Medicare Other | Source: Ambulatory Visit | Attending: Radiation Oncology | Admitting: Radiation Oncology

## 2013-04-27 ENCOUNTER — Encounter: Payer: Self-pay | Admitting: Radiation Oncology

## 2013-04-27 VITALS — BP 151/68 | HR 77 | Temp 98.7°F | Ht 69.0 in | Wt 178.1 lb

## 2013-04-27 DIAGNOSIS — C099 Malignant neoplasm of tonsil, unspecified: Secondary | ICD-10-CM

## 2013-04-27 HISTORY — DX: Personal history of antineoplastic chemotherapy: Z92.21

## 2013-04-27 HISTORY — DX: Personal history of irradiation: Z92.3

## 2013-04-27 NOTE — Progress Notes (Addendum)
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   He states he is eating, and some food tastes good, but other food lacks taste. He is not using his PEG tube presently and wants it removed ASAP. His voice is deeper than his normal. The edema in the lower jaw has decreased with PT.  Would like to resume light exercise which I encouraged.  Overall doing quite well. Reviewed PET results which show interval resolution of disease, no signs of metastases.                             ALLERGIES:  has No Known Allergies.  Meds: Current Outpatient Prescriptions  Medication Sig Dispense Refill  . aspirin 81 MG tablet Take 81 mg by mouth daily.      Marland Kitchen ENSURE PLUS (ENSURE PLUS) LIQD Take 237 mLs by mouth. 6 cans daily      . metoprolol succinate (TOPROL-XL) 50 MG 24 hr tablet Take 75 mg by mouth daily before breakfast.       . sodium fluoride (FLUORISHIELD) 1.1 % GEL dental gel Brush and floss. Instill one drop of fluoride into fluoride tray. Place over teeth for 5 minutes. Remove. Spit out excess. Repeat nightly  120 mL  prn  . sodium fluoride (PREVIDENT 5000 PLUS) 1.1 % CREA dental cream Apply thin ribbon to tooth brush. Brush teeth for 2 minutes. Spit out excess-DO NOT swallow. DO NOT rinse afterwards. Repeat nightly.  1 Tube  prn  . tamsulosin (FLOMAX) 0.4 MG CAPS Take 0.4 mg by mouth daily.      . Water For Irrigation, Sterile (FREE WATER) SOLN Place 60 mLs into feeding tube 4 (four) times daily.      Marland Kitchen omeprazole  (PRILOSEC) 20 MG capsule Take 20 mg by mouth daily.        No current facility-administered medications for this encounter.    Physical Findings: The patient is in no acute distress. Patient is alert and oriented.  height is 5\' 9"  (1.753 m) and weight is 178 lb 1.6 oz (80.786 kg). His temperature is 98.7 F (37.1 C). His blood pressure is 151/68 and his pulse is 77. Marland Kitchen  General: Alert and oriented, in no acute distress HEENT: Head is normocephalic. Oropharynx is moist and clear - no lesions. Tongue is whitish but I do not favor this to be thrush. Neck: Neck is supple, no palpable cervical or supraclavicular lymphadenopathy. Minimal lymphedema. Psychiatric: Judgment and insight are intact. Affect is appropriate.  . Lab Findings: Lab Results  Component Value Date   WBC 5.6 03/14/2013   HGB 11.1* 03/14/2013   HCT 31.6* 03/14/2013   MCV 99.9* 03/14/2013   PLT 168 03/14/2013    Radiographic Findings: Nm Pet Image Restag (ps) Skull Base To Thigh  04/23/2013   *RADIOLOGY REPORT*  Clinical Data: Subsequent treatment strategy for restaging of tonsillar cancer.  NUCLEAR MEDICINE PET SKULL BASE TO THIGH  Fasting Blood Glucose:  92  Technique:  15.8  mCi F-18 FDG was injected intravenously. CT data was obtained and used for attenuation correction and anatomic localization only.  (This was not acquired as a diagnostic CT examination.) Additional exam technical data entered on technologist worksheet.  Comparison:  10/26/2012  Findings:  Neck: Interval resolution of the previously described right tonsillar hypermetabolic soft tissue mass.  Resolution of previously described right-sided level II and III nodal hypermetabolism.  Chest:  Mild hypermetabolism which corresponds to subpleural minimal right apical airspace disease on image 72/series 2.  This measures a S.U.V. max of 4.3.  Abdomen/Pelvis:  No abnormal hypermetabolism.  Skeleton:  Bilateral greater trochanteric hypermetabolism which is likely related to  bursitis.  CT  images performed for attenuation correction demonstrate bilateral carotid atherosclerosis.  This is greater on the right than left.  Mild cardiomegaly with trace pericardial fluid or thickening. Trace right-sided pleural fluid.  Calcified right hilar lymph nodes, consistent with old granulomatous disease.  Punctate lower pole right renal calculus. Normal adrenal glands. Gastrostomy tube.  Moderate prostatomegaly.  IMPRESSION:  1.  Interval response to therapy of right tonsillar primary and right-sided cervical nodal metastasis. 2.  No evidence of new or metastatic disease. 3.  Minimal hypermetabolism corresponding to right apical airspace disease.  Favored to be related to radiation change. 4.  Trace right-sided pleural fluid.   Original Report Authenticated By: Jeronimo Greaves, M.D.    Impression/Plan:    1) Head and Neck Cancer Status: No evidence of disease per PET and physical exam. He and his wife are thrilled.  2) Nutritional Status:  - weight:stable with PO intake alone - PEG tube: order for removal  3) Risk Factors: The patient has been educated about risk factors including alcohol and tobacco abuse; they understand that avoidance of alcohol and tobacco is important to prevent recurrences as well as other cancers   4) Swallowing: good  5) Dental: Encouraged to continue regular followup with dentistry, and dental hygiene including fluoride rinses. He is brushing well, seeing dentist, but needs to resume regular fluoride rinses  6) Energy: reasonable; TSH pending in Sept with med/onc  7) Social: No active social issues to address at this time. Encouraged light exercise. Returning to work.  8) Follow-up in early Feb  (I am on maternity leave through Jan).   Imaging PRN only if new symptoms arise.  Sees med/onc in Sept. Pt instructed to see Dr. Jearld Fenton in early Nov so that our appointments can be staggered. The patient was encouraged to call with any issues or questions before then.     I spent 25 minutes minutes face to face with the patient and more than 50% of that time was spent in counseling and/or coordination of care. _____________________________________   Lonie Peak, MD

## 2013-04-27 NOTE — Progress Notes (Signed)
Omar Sawyer here today for a fu s/p radiation to the right tonsil which completed on 12/29/12.  He states he is eating, but food lacks taste.  He is not using his PEG tube presently and wants it removed ASAP.    His voice is deeper than his normal. The edema in the lower jaw has decreased.

## 2013-05-01 NOTE — Patient Instructions (Signed)
Arrive 05-04-13 at 8:00 for 8:30 appointment for g tube removal.   Npo after midnight except for meds.

## 2013-05-04 ENCOUNTER — Ambulatory Visit (HOSPITAL_COMMUNITY)
Admission: RE | Admit: 2013-05-04 | Discharge: 2013-05-04 | Disposition: A | Discharge status: Home / Self Care | Payer: Medicare Other | Source: Ambulatory Visit | Attending: Radiation Oncology | Admitting: Radiation Oncology

## 2013-05-04 DIAGNOSIS — Z431 Encounter for attention to gastrostomy: Secondary | ICD-10-CM | POA: Insufficient documentation

## 2013-05-04 DIAGNOSIS — C099 Malignant neoplasm of tonsil, unspecified: Secondary | ICD-10-CM | POA: Insufficient documentation

## 2013-05-04 MED ORDER — LIDOCAINE VISCOUS 2 % MT SOLN
15.0000 mL | Freq: Once | OROMUCOSAL | Status: AC
  Administered 2013-05-04: 15 mL via OROMUCOSAL
  Filled 2013-05-04: qty 15

## 2013-05-04 NOTE — Procedures (Signed)
History of tonsillar carcinoma 19F percutaneous pull through gastric tube placed 10/2012. G-tube no longer needed. Patient tolerating oral, successful removal of 19F g-tube with no immediate complications. Dressing placed and dressing care discussed.  Pattricia Boss PA-C Interventional Radiology  05/04/13  12:14 PM

## 2013-05-11 ENCOUNTER — Ambulatory Visit: Payer: Medicare Other

## 2013-05-15 ENCOUNTER — Encounter: Payer: Self-pay | Admitting: Internal Medicine

## 2013-05-15 ENCOUNTER — Other Ambulatory Visit (HOSPITAL_BASED_OUTPATIENT_CLINIC_OR_DEPARTMENT_OTHER): Payer: Medicare Other | Admitting: Lab

## 2013-05-15 ENCOUNTER — Telehealth: Payer: Self-pay | Admitting: Internal Medicine

## 2013-05-15 ENCOUNTER — Ambulatory Visit (HOSPITAL_BASED_OUTPATIENT_CLINIC_OR_DEPARTMENT_OTHER): Payer: Medicare Other | Admitting: Internal Medicine

## 2013-05-15 VITALS — BP 113/54 | HR 80 | Temp 97.4°F | Resp 19 | Ht 69.0 in | Wt 174.9 lb

## 2013-05-15 DIAGNOSIS — C099 Malignant neoplasm of tonsil, unspecified: Secondary | ICD-10-CM

## 2013-05-15 DIAGNOSIS — B370 Candidal stomatitis: Secondary | ICD-10-CM

## 2013-05-15 DIAGNOSIS — N289 Disorder of kidney and ureter, unspecified: Secondary | ICD-10-CM

## 2013-05-15 HISTORY — DX: Candidal stomatitis: B37.0

## 2013-05-15 LAB — CBC WITH DIFFERENTIAL/PLATELET
Basophils Absolute: 0 10*3/uL (ref 0.0–0.1)
EOS%: 1.7 % (ref 0.0–7.0)
HCT: 31.8 % — ABNORMAL LOW (ref 38.4–49.9)
HGB: 11.1 g/dL — ABNORMAL LOW (ref 13.0–17.1)
MCH: 34.2 pg — ABNORMAL HIGH (ref 27.2–33.4)
MCV: 97.6 fL (ref 79.3–98.0)
MONO%: 10.2 % (ref 0.0–14.0)
NEUT%: 76.9 % — ABNORMAL HIGH (ref 39.0–75.0)
RDW: 13.1 % (ref 11.0–14.6)

## 2013-05-15 LAB — COMPREHENSIVE METABOLIC PANEL (CC13)
ALT: 17 U/L (ref 0–55)
AST: 16 U/L (ref 5–34)
Albumin: 3.7 g/dL (ref 3.5–5.0)
Alkaline Phosphatase: 62 U/L (ref 40–150)
BUN: 23.9 mg/dL (ref 7.0–26.0)
Potassium: 4.2 mEq/L (ref 3.5–5.1)

## 2013-05-15 MED ORDER — NYSTATIN 100000 UNIT/ML MT SUSP
500000.0000 [IU] | Freq: Four times a day (QID) | OROMUCOSAL | Status: DC
Start: 2013-05-15 — End: 2013-07-23

## 2013-05-15 NOTE — Progress Notes (Signed)
[facility] OFFICE PROGRESS NOTE  [name],[name], MD [address] [place] [address]  DIAGNOSIS: Tonsil cancer - Plan: CBC with Differential, Comprehensive metabolic panel, TSH  Thrush, oral  Chief Complaint  Patient presents with  . Follow-up    Tonsil Cancer   PAST THERAPY:  concurrent chemo with q3 weeks Cisplatin and daily radiation between 11/13/2012 and 12/29/2012. His 3rd and last dose of chemo was canceled due to poor toleration.    CURRENT THERAPY:  Watchful observation.   INTERVAL HISTORY: Omar Sawyer 72 y.o. male returns for regular follow up with his wife.   His strength is now better than before.  He is able to ambulate to the mailbox which is a long walk. He is starting to have taste for mechanical soft foods such as apple sauce.  He denied tinnitus, but he has some hearing loss.  He denied palpable node in the neck.  He denied PEG tube pain, erythema or discharge.  The rest of the 14-point review of system was negative.   MEDICAL HISTORY: Past Medical History  Diagnosis Date  . Essential hypertension, benign   . Mixed hyperlipidemia   . Unspecified sinusitis (chronic)   . Edema   . Personal history of adenomatous colonic polyps 07/23/2012    2010  . Hearing loss   . Hx of prostatitis   . 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 Anxiety     mild new dx  . GERD (gastroesophageal reflux disease)   . Neuromuscular disorder     b/l tremors,   . Basal cell carcinoma   . Mucositis 12/04/2012  . Renal insufficiency 12/16/2012  . Status post chemotherapy     concurrent chemo with q3 weeks Cisplatin and daily radiation between 11/13/2012 and 12/29/2012  . S/P radiation therapy 11/13/2012-12/29/2012      Right tonsil/ bilateral neck/ total dose 70 Gy in 35 fractions    INTERIM HISTORY:  has ABDOMINAL PAIN, EPIGASTRIC; HTN (hypertension); Hyperlipidemia; Personal history of adenomatous colonic polyps; Cancer; Tonsil cancer; Mucositis; Acute renal failure; Dehydration; Nausea and vomiting; Renal insufficiency; and Thrush, oral on his problem list.    ALLERGIES:  has No Known Allergies.  MEDICATIONS: has a current medication list which includes the following prescription(s): aspirin, ensure plus, metoprolol succinate, omeprazole, sodium fluoride, sodium fluoride, tamsulosin, free water, and nystatin.  SURGICAL HISTORY:  Past Surgical History  Procedure Laterality Date  . Colonoscopy  2011    polyps. Dr. Leone Payor.  Due now 2013  . Biopsy of right tonsil Right 10/03/2012    Squamous Cell Carcinoma     . Blepharoplasty      REVIEW OF SYSTEMS:   Constitutional: Denies fevers, chills or abnormal weight loss Eyes: Denies blurriness of vision Ears, nose, mouth, throat, and face: Denies mucositis or sore throat Respiratory: Denies cough, dyspnea or wheezes Cardiovascular: Denies palpitation, chest discomfort or lower extremity swelling Gastrointestinal:  Denies nausea, heartburn or change in bowel habits Skin: Denies abnormal skin rashes Lymphatics: Denies new lymphadenopathy or easy bruising Neurological:Denies numbness, tingling or new weaknesses Behavioral/Psych: Mood is stable, no new changes  All other systems were reviewed with the patient and are negative.  PHYSICAL EXAMINATION: ECOG PERFORMANCE STATUS: 0 - Asymptomatic  Blood pressure 113/54, pulse 80, temperature 97.4 F (  36.3 C), temperature source Oral, resp. rate 19, height 5\' 9"  (1.753 m), weight 174 lb 14.4 oz (79.334 kg).  GENERAL:alert, no distress and comfortable SKIN: skin color, texture, turgor are normal, no rashes or significant lesions; redness skin over left side of throat EYES: normal, Conjunctiva are pink and non-injected, sclera clear OROPHARYNX:no exudate, no erythema and lips, buccal mucosa, and  tongue with whitish strips over it NECK: supple, thyroid normal size, non-tender, without nodularity LYMPH:  no palpable lymphadenopathy in the cervical, axillary or inguinal LUNGS: clear to auscultation and percussion with normal breathing effort HEART: regular rate & rhythm and no murmurs and no lower extremity edema ABDOMEN:abdomen soft, non-tender and normal bowel sounds Musculoskeletal:no cyanosis of digits and no clubbing  NEURO: alert & oriented x 3 with fluent speech, no focal motor/sensory deficits   LABORATORY DATA: Results for orders placed in visit on 05/15/13 (from the past 48 hour(s))  COMPREHENSIVE METABOLIC PANEL ([facility]13)     Status: Abnormal   Collection Time    05/15/13  7:47 AM      Result Value Range   Sodium 139  136 - 145 mEq/L   Potassium 4.2  3.5 - 5.1 mEq/L   Chloride 103  98 - 109 mEq/L   CO2 27  22 - 29 mEq/L   Glucose 107  70 - 140 mg/dl   BUN 45.4  7.0 - 09.8 mg/dL   Creatinine 1.4 (*) 0.7 - 1.3 mg/dL   Total Bilirubin 1.19  0.20 - 1.20 mg/dL   Alkaline Phosphatase 62  40 - 150 U/L   AST 16  5 - 34 U/L   ALT 17  0 - 55 U/L   Total Protein 6.8  6.4 - 8.3 g/dL   Albumin 3.7  3.5 - 5.0 g/dL   Calcium 9.6  8.4 - 14.7 mg/dL  CBC WITH DIFFERENTIAL     Status: Abnormal   Collection Time    05/15/13  7:48 AM      Result Value Range   WBC 5.0  4.0 - 10.3 10e3/uL   NEUT# 3.8  1.5 - 6.5 10e3/uL   HGB 11.1 (*) 13.0 - 17.1 g/dL   HCT 82.9 (*) 56.2 - 13.0 %   Platelets 175  140 - 400 10e3/uL   MCV 97.6  79.3 - 98.0 fL   MCH 34.2 (*) 27.2 - 33.4 pg   MCHC 35.0  32.0 - 36.0 g/dL   RBC 8.65 (*) 7.84 - 6.96 10e6/uL   RDW 13.1  11.0 - 14.6 %   lymph# 0.5 (*) 0.9 - 3.3 10e3/uL   MONO# 0.5  0.1 - 0.9 10e3/uL   Eosinophils Absolute 0.1  0.0 - 0.5 10e3/uL   Basophils Absolute 0.0  0.0 - 0.1 10e3/uL   NEUT% 76.9 (*) 39.0 - 75.0 %   LYMPH% 10.9 (*) 14.0 - 49.0 %   MONO% 10.2  0.0 - 14.0 %   EOS% 1.7  0.0 - 7.0 %   BASO% 0.3  0.0 - 2.0 %  TSH [facility]     Status:  None   Collection Time    05/15/13  7:48 AM      Result Value Range   TSH 1.055  0.320 - 4.118 m(IU)/L      RADIOGRAPHIC STUDIES: Nm Pet Image Restag (ps) Skull Base To Thigh  04/23/2013   *RADIOLOGY REPORT*  Clinical Data: Subsequent treatment strategy for restaging of tonsillar cancer.  NUCLEAR MEDICINE PET SKULL BASE TO THIGH  Fasting Blood  Glucose:  92  Technique:  15.8 mCi F-18 FDG was injected intravenously. CT data was obtained and used for attenuation correction and anatomic localization only.  (This was not acquired as a diagnostic CT examination.) Additional exam technical data entered on technologist worksheet.  Comparison:  10/26/2012  Findings:  Neck: Interval resolution of the previously described right tonsillar hypermetabolic soft tissue mass.  Resolution of previously described right-sided level II and III nodal hypermetabolism.  Chest:  Mild hypermetabolism which corresponds to subpleural minimal right apical airspace disease on image 72/series 2.  This measures a S.U.V. max of 4.3.  Abdomen/Pelvis:  No abnormal hypermetabolism.  Skeleton:  Bilateral greater trochanteric hypermetabolism which is likely related to bursitis.  CT  images performed for attenuation correction demonstrate bilateral carotid atherosclerosis.  This is greater on the right than left.  Mild cardiomegaly with trace pericardial fluid or thickening. Trace right-sided pleural fluid.  Calcified right hilar lymph nodes, consistent with old granulomatous disease.  Punctate lower pole right renal calculus. Normal adrenal glands. Gastrostomy tube.  Moderate prostatomegaly.  IMPRESSION:  1.  Interval response to therapy of right tonsillar primary and right-sided cervical nodal metastasis. 2.  No evidence of new or metastatic disease. 3.  Minimal hypermetabolism corresponding to right apical airspace disease.  Favored to be related to radiation change. 4.  Trace right-sided pleural fluid.   Original Report Authenticated By:  Jeronimo Greaves, M.D.   Ir Gastrostomy Tube Removal  05/04/2013   *RADIOLOGY REPORT*  Clinical Data: *RADIOLOGY REPORT*  Clinical Data:  Tonsillar carcinoma, percutaneous gastric tube placed 10/2012, patient no longer needs g-tube, he is tolerating oral.  PERCUTANEOUS GASTROSTOMY TUBE REMOVAL  Gastrostomy tube removal was discussed with the patient and questions were answered. Risk of infection and bleeding were also discussed.  Written as well as verbal consent was obtained.  Medications: Viscous lidocaine applied in percutaneous gastric site.  The gastrostomy tube and surrounding skin were draped and prepped in sterile fashion.  The gastrostomy tube was removed in its entirety by applying manual traction.  Pressure was held until hemostasis was obtained.  The patient tolerated the procedure well with no immediate complications.  IMPRESSION: Successful 72F pull through gastrostomy tube removal.  Read By: Pattricia Boss PA-C   Original Report Authenticated By: Irish Lack, M.D.    ASSESSMENT: Tonsol cancer s/p concurrent chemo with q3 weeks Cisplatin and daily radiation between 11/13/2012 and 12/29/2012 now w/o evidence of disease.    PLAN:  1. Follow-up recommendation per NCCN Guidelines: Year 1 H & P exam every 1-3 months; TSH every 6-12 months; speech/hearing and swallowing evaluation and rehabilitation as clinically indicated and dental evaluation.  2. Provided Nystatin wash for areas of thrush.  3. Continue f/u per radiation oncology and ENT.  Follow up with medical oncology in 2-3 months.   All questions were answered. The patient knows to call the clinic with any problems, questions or concerns. We can certainly see the patient much sooner if necessary.  I spent 15 minutes counseling the patient face to face. The total time spent in the appointment was 30 minutes.    Sumeya Yontz, MD 05/17/2013 12:43 AM

## 2013-05-15 NOTE — Telephone Encounter (Signed)
, °

## 2013-05-17 ENCOUNTER — Encounter: Payer: Self-pay | Admitting: Internal Medicine

## 2013-05-21 ENCOUNTER — Encounter: Payer: Self-pay | Admitting: Internal Medicine

## 2013-06-08 ENCOUNTER — Other Ambulatory Visit: Payer: Self-pay | Admitting: Cardiovascular Disease

## 2013-06-16 IMAGING — CT CT NECK W/ CM
4 of 5 series · 16 of 33 positions shown, 18 images · IV contrast (OMNIPAQUE)
Comparison: PET

CT from the same day reported separately.

CLINICAL DATA: 72-year-old male with right tonsil squamous cell
carcinoma diagnosed this month.

CT NECK WITH CONTRAST
TECHNIQUE: Multidetector CT imaging of the neck was performed with
intravenous contrast.
Contrast:  100 ml Omnipaque-DPP.

[Series 2: neck st · axial · 0.46mm/px · z∈[-221,-92]mm · 3 of 87 slices shown, 4 images]
[im 22/87  soft-tissue]
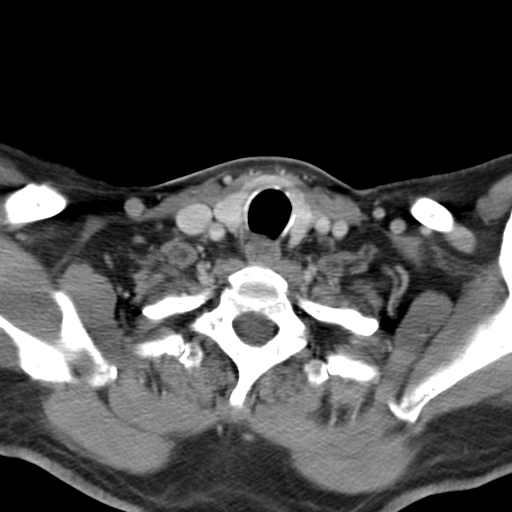
[im 22/87  bone]
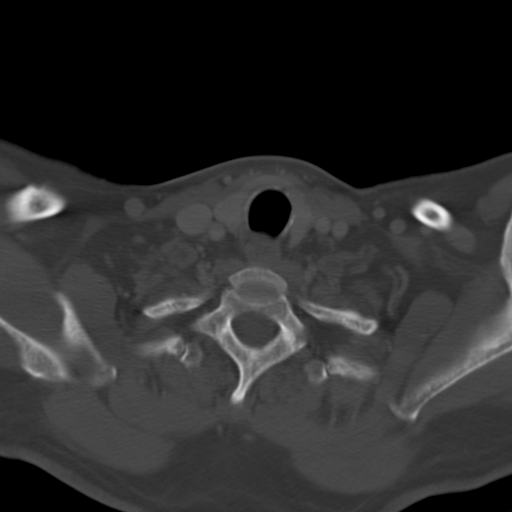
[im 44/87  bone]
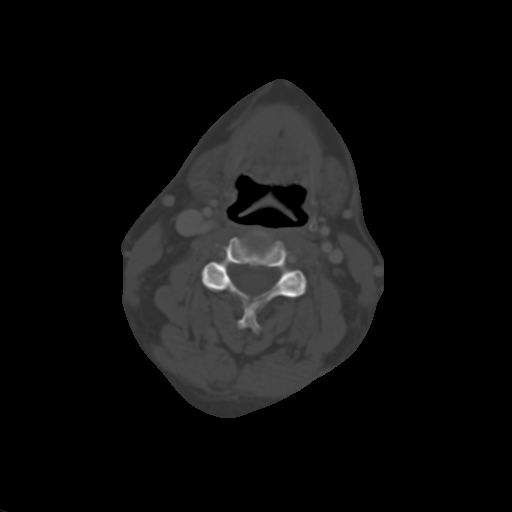
[im 65/87  bone]
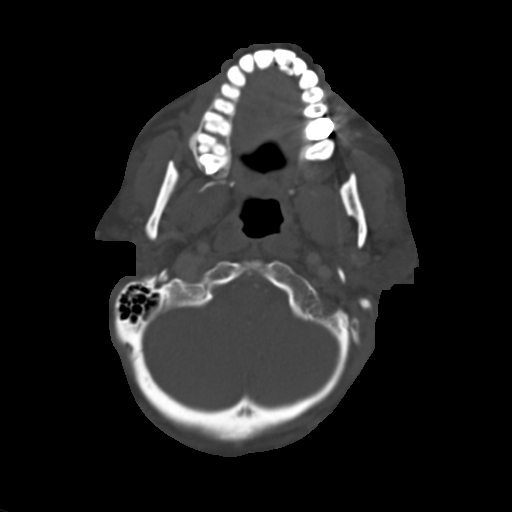

[Series 602: <mpr thick range> · coronal · 0.51mm/px · 3 of 82 slices shown]
[im 17/82  bone]
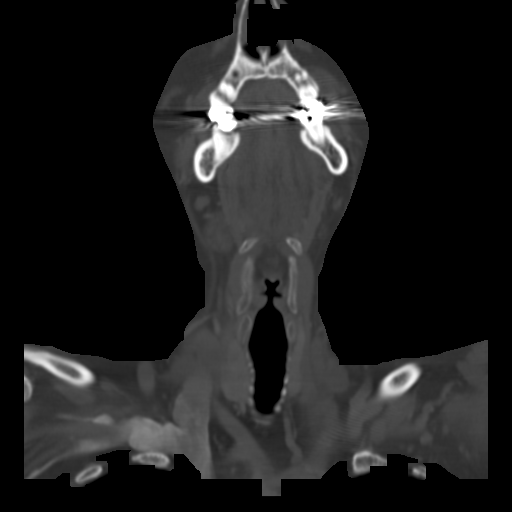
[im 33/82  bone]
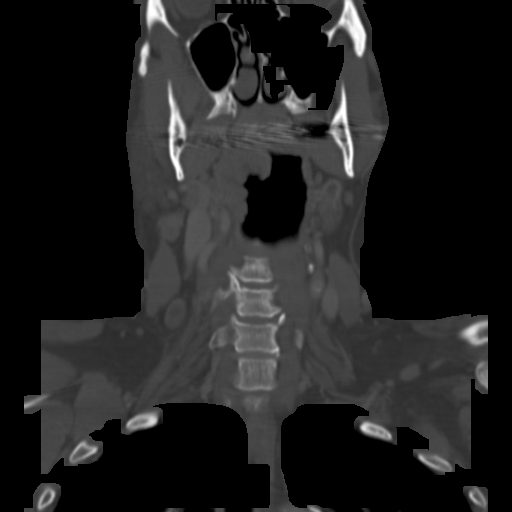
[im 49/82  bone]
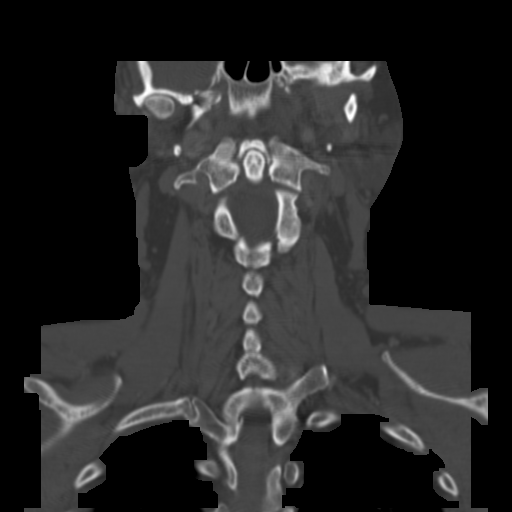

[Series 603: <mpr thick range(1)> · axial · 0.51mm/px · z∈[-289,-146]mm · 5 of 116 slices shown]
[im 20/116  bone]
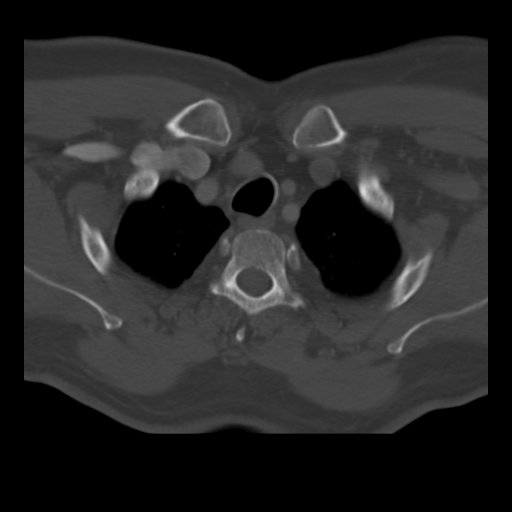
[im 39/116  bone]
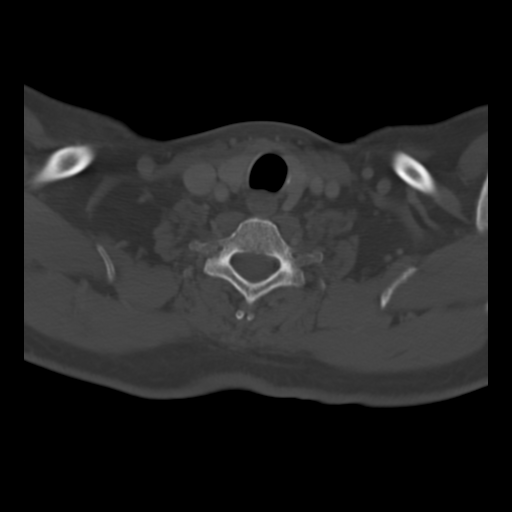
[im 58/116  bone]
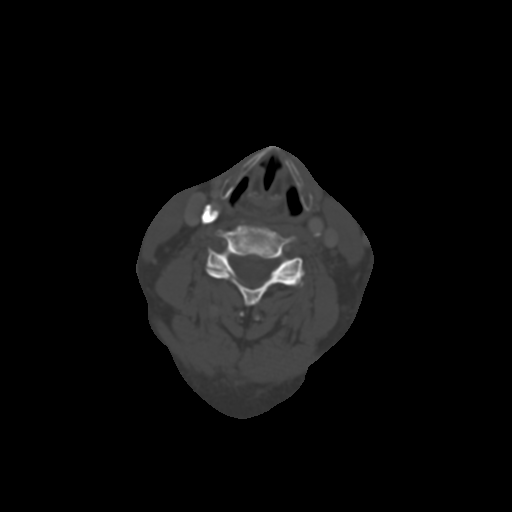
[im 77/116  bone]
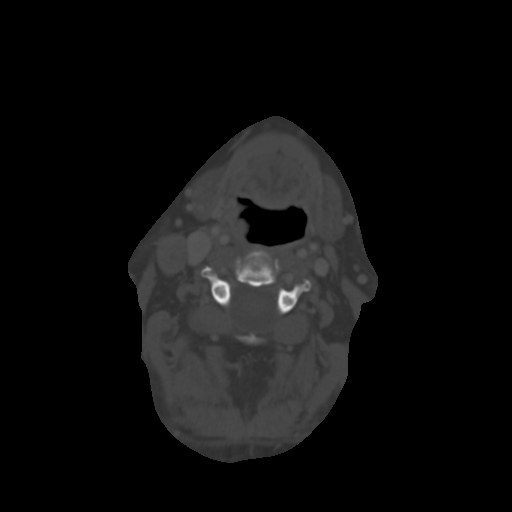
[im 96/116  bone]
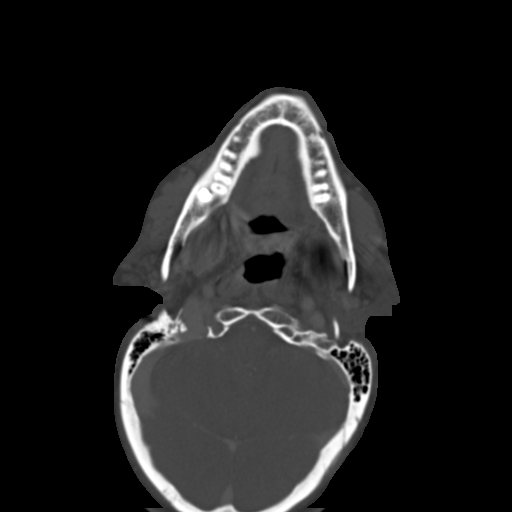

[Series 604: <mpr thick range(2)> · sagittal · 0.51mm/px · 5 of 71 slices shown, 6 images]
[im 24/71  bone]
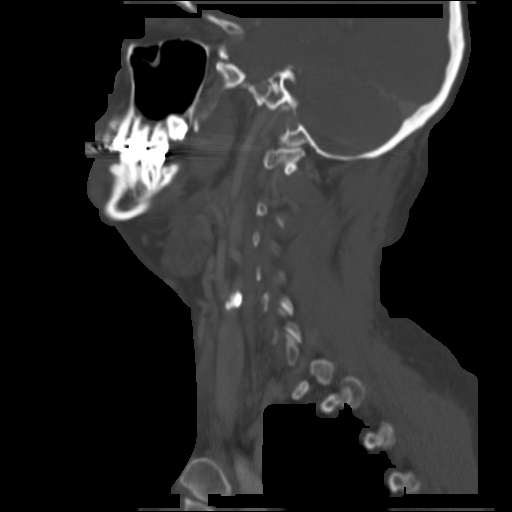
[im 30/71  bone]
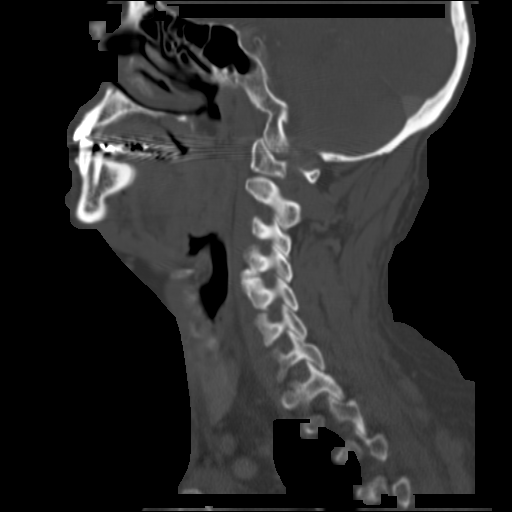
[im 36/71  soft-tissue]
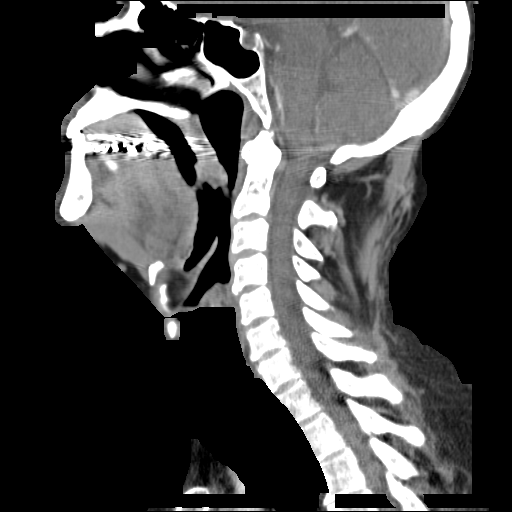
[im 36/71  bone]
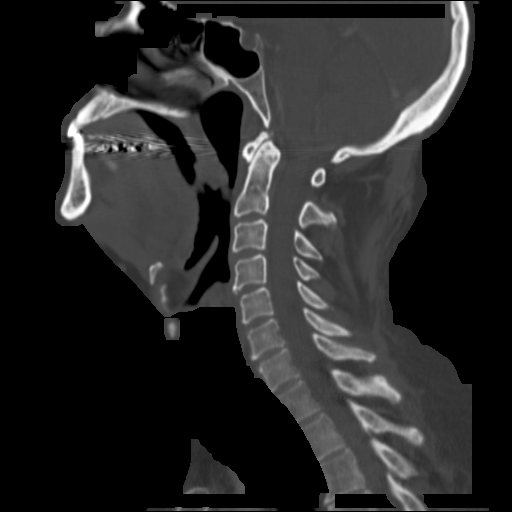
[im 41/71  bone]
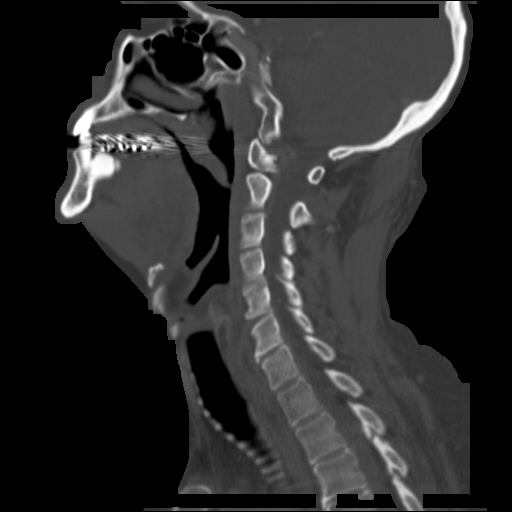
[im 47/71  bone]
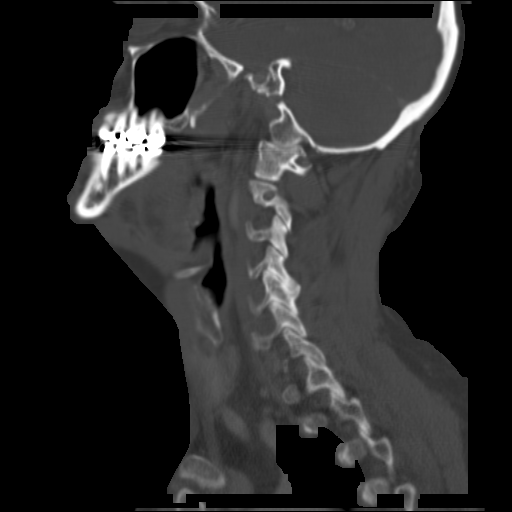

[16 of 33 positions shown; findings below may reference images not displayed]

FINDINGS: Negative lung apices.  No superior mediastinal
lymphadenopathy.  Negative thyroid, retropharyngeal space, left
parapharyngeal space, sublingual space, submandibular glands,
parotid glands, visible orbit soft tissues, and visualized brain
parenchyma. Visualized paranasal sinuses and mastoids are clear.
No acute osseous abnormality identified.  Incidental torus
mandibularis.

Heterogeneously enhancing soft tissue mass centered at the right
tonsillar pillar and inseparable from the right soft palate
encompasses 29 x 27 x 40 mm (AP by transverse by CC). Associated
blunting of the right glossotonsillar sulcus.  Tongue base and
vallecula otherwise normal.  Epiglottis, hypopharynx and larynx are
within normal limits.

Pathologic right level IIA lymph node measures 17 mm short axis (26
mm long axis).  There is also an abnormally heterogeneous lymph
node at level III on the right along the posterior margin of the
right sternocleidomastoid muscle measuring 8 mm short axis (23 mm
long axis).  There are intervening normal/tiny other level II and
level III nodes.  No level I lymphadenopathy.  Small level IV nodes
appear symmetric and within normal limits.  No left side cervical
lymphadenopathy.
IMPRESSION: Right tonsil oropharyngeal mass compatible with squamous cell
carcinoma measuring up to 40 mm largest dimension.  Imaging stage
is T2 N2b (abnormal right level II and level III lymph nodes).

## 2013-07-05 ENCOUNTER — Other Ambulatory Visit: Payer: Self-pay

## 2013-07-23 ENCOUNTER — Ambulatory Visit (AMBULATORY_SURGERY_CENTER): Payer: Self-pay

## 2013-07-23 VITALS — Ht 69.0 in | Wt 170.0 lb

## 2013-07-23 DIAGNOSIS — Z860100 Personal history of colon polyps, unspecified: Secondary | ICD-10-CM

## 2013-07-23 MED ORDER — SUPREP BOWEL PREP KIT 17.5-3.13-1.6 GM/177ML PO SOLN: 1.0000 | Freq: Once | ORAL | Status: DC

## 2013-07-25 ENCOUNTER — Encounter: Payer: Self-pay | Admitting: Internal Medicine

## 2013-08-04 IMAGING — US US RENAL
1 series · 14 of 25 positions shown · non-contrast
Comparison: none

CLINICAL DATA: Acute renal failure.  History squamous cell
carcinoma and hypertension.

RENAL/URINARY TRACT ULTRASOUND COMPLETE

[Series 1: us renal · 0.22mm/px · 14 of 50 slices shown]
[im 1/50]
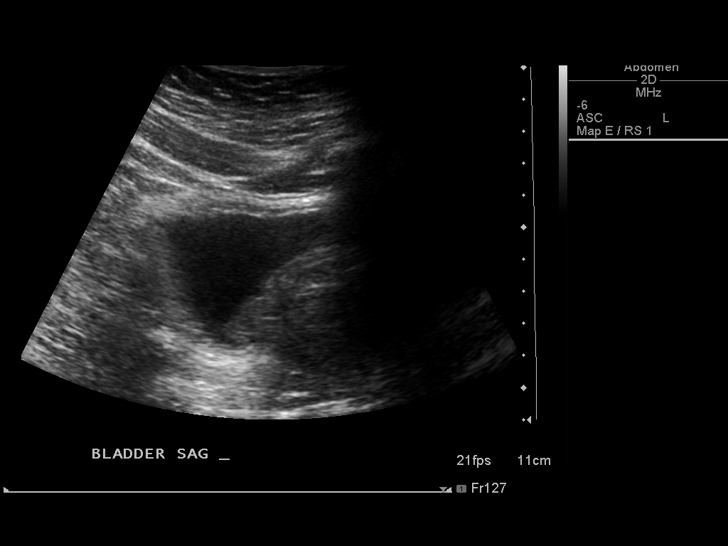
[im 5/50]
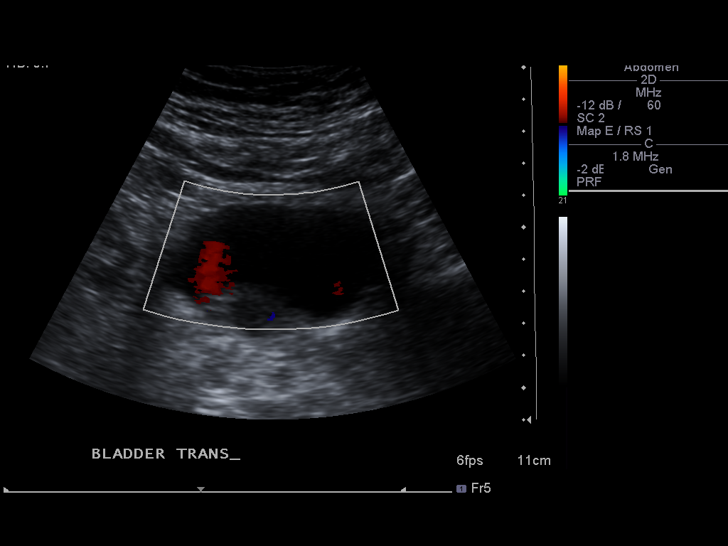
[im 9/50]
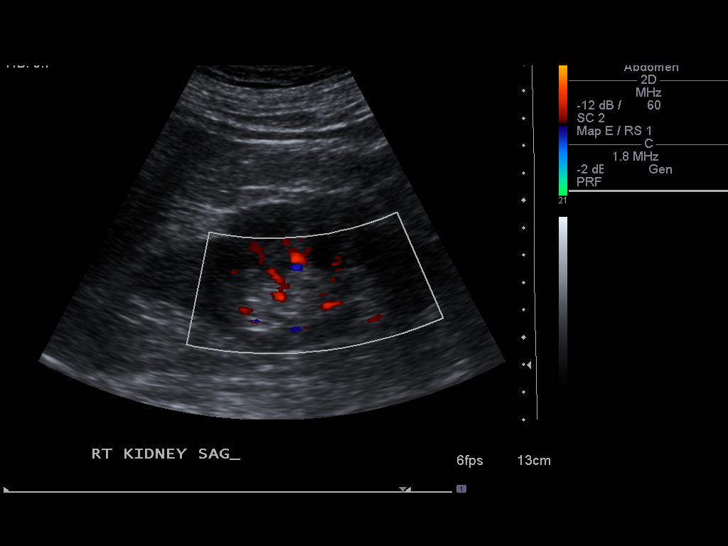
[im 13/50]
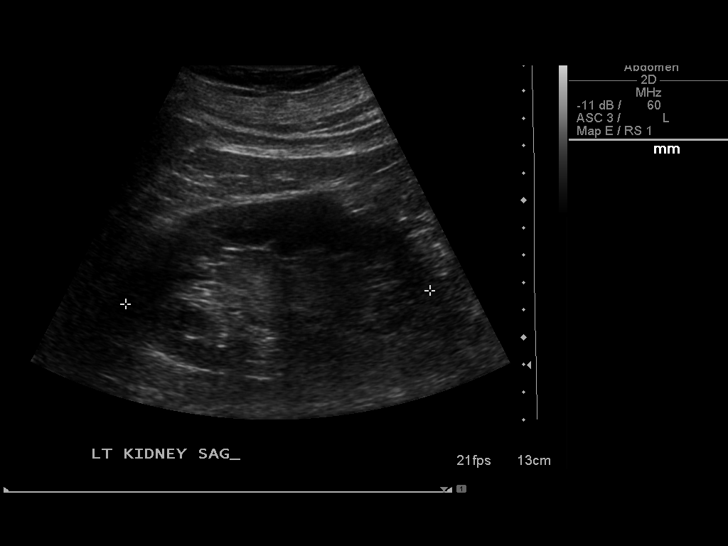
[im 17/50]
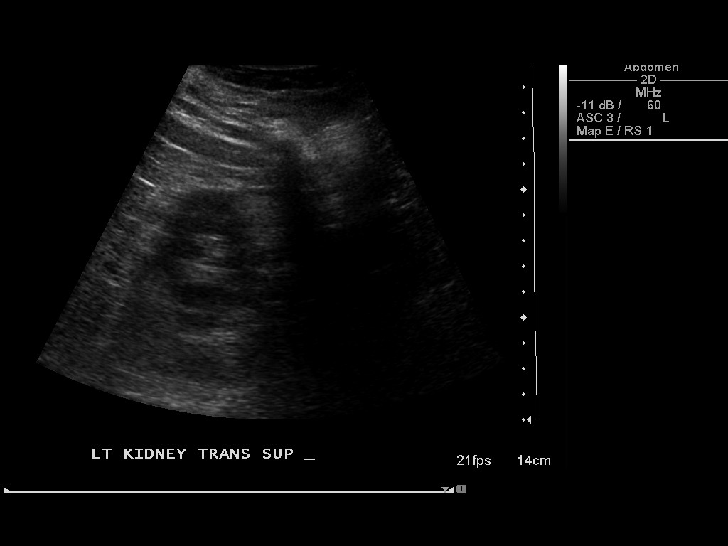
[im 19/50]
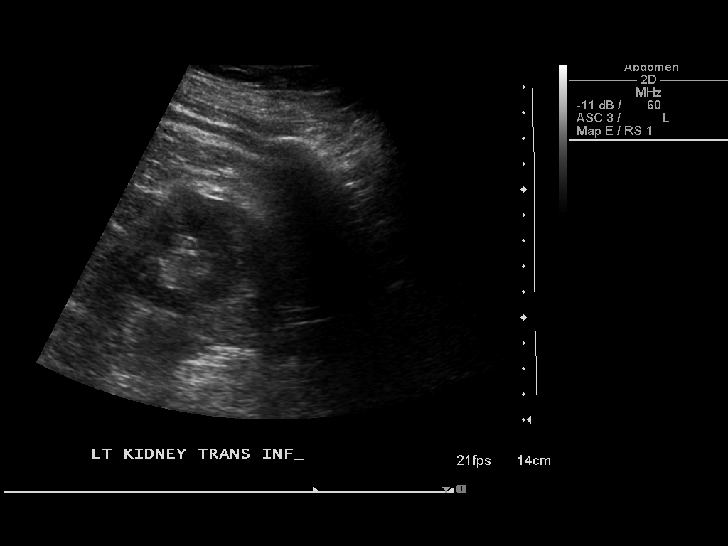
[im 23/50]
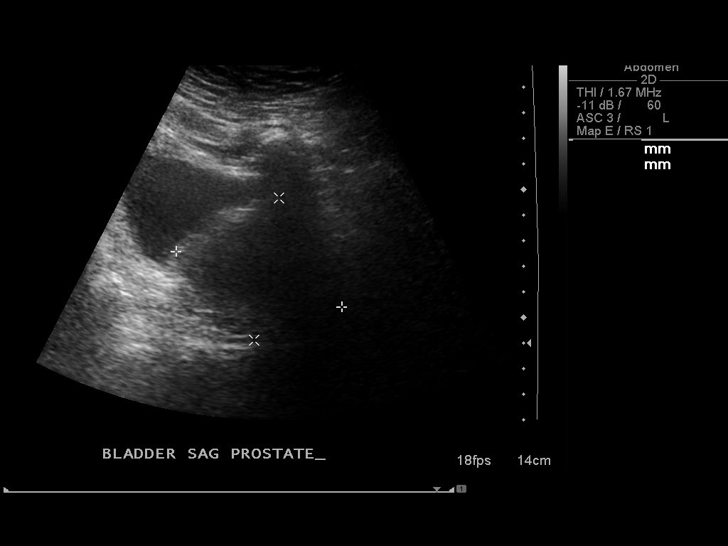
[im 27/50]
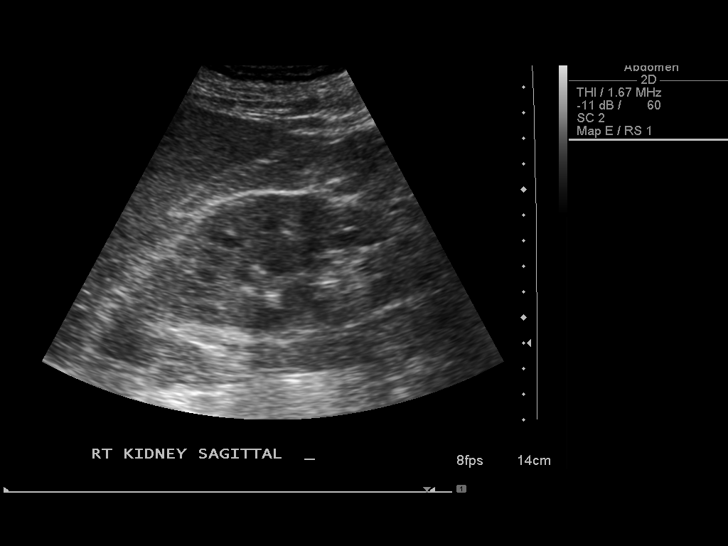
[im 31/50]
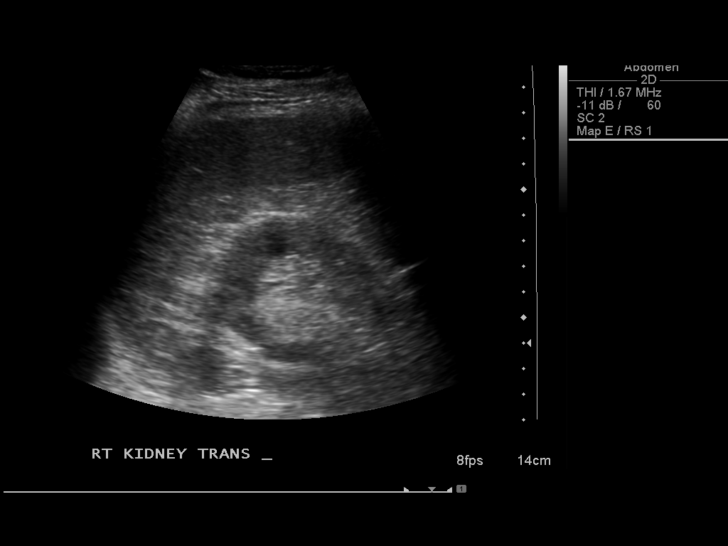
[im 33/50]
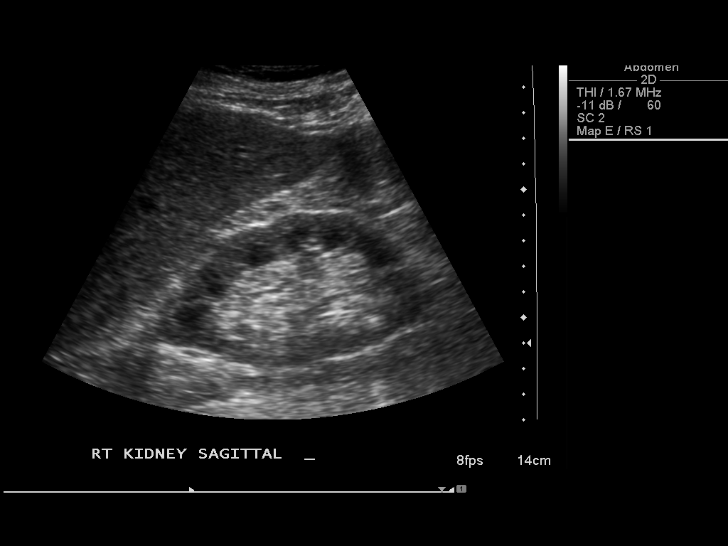
[im 37/50]
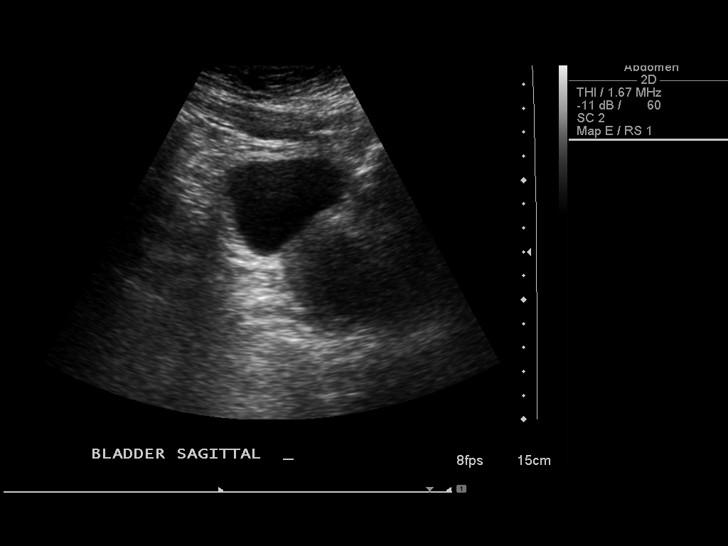
[im 41/50]
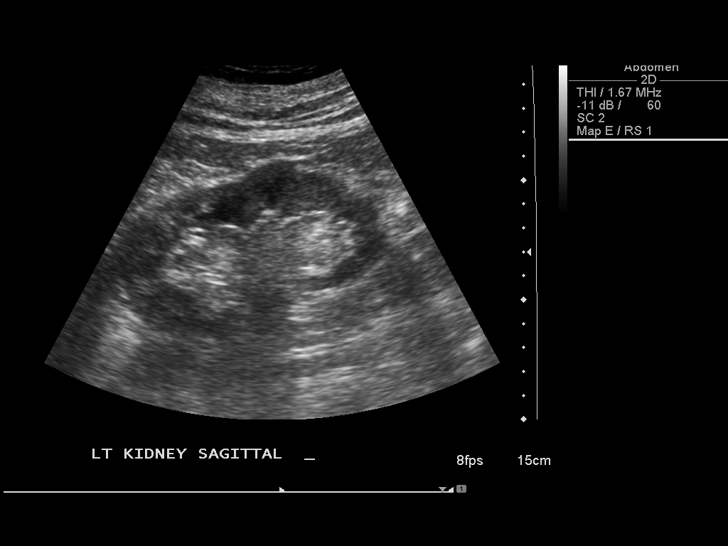
[im 45/50]
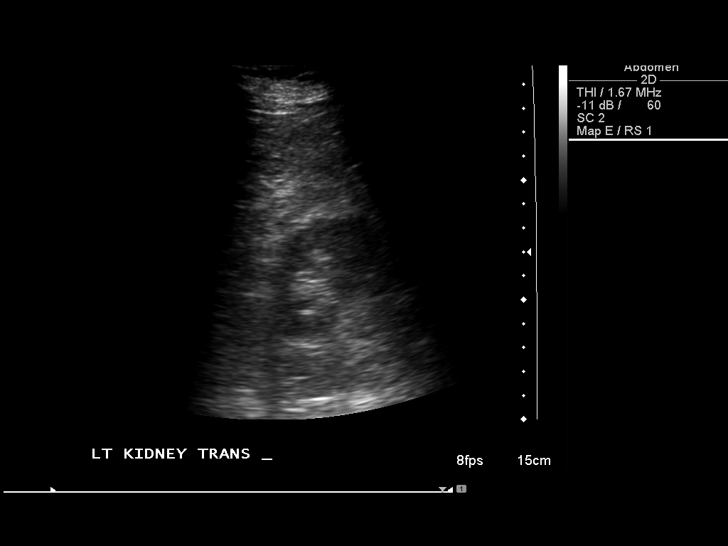
[im 50/50]
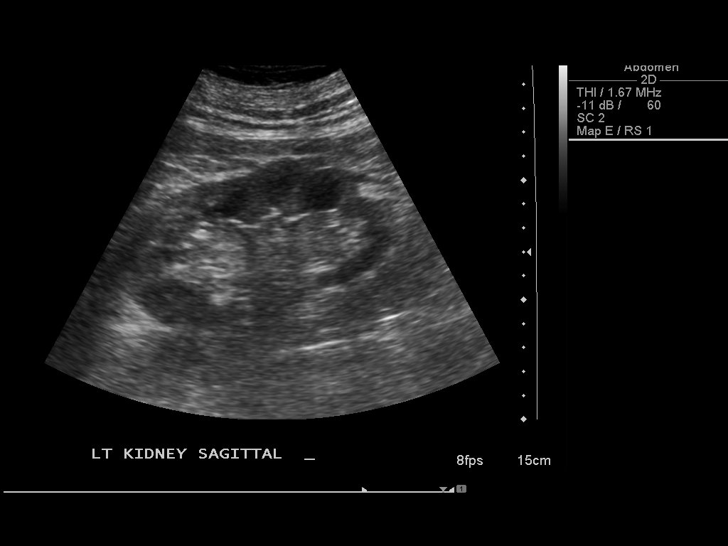

[14 of 25 positions shown; findings below may reference images not displayed]

FINDINGS: Right Kidney:  11.7 cm. No hydronephrosis.  Normal renal cortical
thickness and echogenicity.

Left Kidney:  11.3 cm. No hydronephrosis.  Normal renal cortical
thickness and echogenicity.  Favor  fetal lobulation or dromedary
hump in the inter/lower pole left kidney.

Bladder:  Within normal limits.

Incidental note is made of moderate prostatomegaly.
IMPRESSION: No hydronephrosis.

Prostatomegaly.

## 2013-08-06 ENCOUNTER — Ambulatory Visit (HOSPITAL_BASED_OUTPATIENT_CLINIC_OR_DEPARTMENT_OTHER): Payer: Medicare Other | Admitting: Internal Medicine

## 2013-08-06 ENCOUNTER — Telehealth: Payer: Self-pay | Admitting: Internal Medicine

## 2013-08-06 ENCOUNTER — Other Ambulatory Visit (HOSPITAL_BASED_OUTPATIENT_CLINIC_OR_DEPARTMENT_OTHER): Payer: Medicare Other

## 2013-08-06 VITALS — BP 145/70 | HR 72 | Temp 98.1°F | Resp 18 | Ht 69.0 in | Wt 171.1 lb

## 2013-08-06 DIAGNOSIS — C099 Malignant neoplasm of tonsil, unspecified: Secondary | ICD-10-CM

## 2013-08-06 DIAGNOSIS — B370 Candidal stomatitis: Secondary | ICD-10-CM

## 2013-08-06 LAB — CBC WITH DIFFERENTIAL/PLATELET
BASO%: 0.4 % (ref 0.0–2.0)
EOS%: 1.4 % (ref 0.0–7.0)
LYMPH%: 10 % — ABNORMAL LOW (ref 14.0–49.0)
MCHC: 34.2 g/dL (ref 32.0–36.0)
MCV: 97.1 fL (ref 79.3–98.0)
MONO%: 8.3 % (ref 0.0–14.0)
Platelets: 163 10*3/uL (ref 140–400)
RBC: 3.87 10*6/uL — ABNORMAL LOW (ref 4.20–5.82)

## 2013-08-06 LAB — COMPREHENSIVE METABOLIC PANEL (CC13)
ALT: 19 U/L (ref 0–55)
Alkaline Phosphatase: 91 U/L (ref 40–150)
Sodium: 141 mEq/L (ref 136–145)
Total Bilirubin: 0.35 mg/dL (ref 0.20–1.20)
Total Protein: 7.1 g/dL (ref 6.4–8.3)

## 2013-08-06 LAB — TSH CHCC: TSH: 1.777 m(IU)/L (ref 0.320–4.118)

## 2013-08-06 MED ORDER — NYSTATIN 100000 UNIT/ML MT SUSP: 5.0000 mL | Freq: Four times a day (QID) | OROMUCOSAL | Status: DC

## 2013-08-06 NOTE — Patient Instructions (Signed)
Sore or Dry Mouth Care °A sore or dry mouth may happen for many different reasons. Sometimes, treatment for other health problems may have to stop until your sore or dry mouth gets better.  °HOME CARE °· Do not smoke or chew tobacco. °· Use fake (artificial) saliva when your mouth feels dry. °· Use a humidifier in your bedroom at night. °· Eat small meals and snacks. °· Eat food cold or at room temperature. °· Suck on ice-chips or try frozen ice pops or juice bars, ice-cream, and watermelon. Do not have citrus flavors. °· Suck on hard, sugarless, sour candy, or chew sugarless gum to help make more saliva. °· Eat soft foods such as yogurt, bananas, canned fruit, mashed potatoes, oatmeal, rice, eggs, cottage cheese, macaroni and cheese, jello, and pudding. °· Microwave vegetables and fruits to soften them. °· Puree cooked food in a blender if needed. °· Make dry food moist by using olive oil, gravy, or mild sauces. Dip foods in liquids. °· Keep a glass of water or squirt bottle nearby. Take sips often throughout the day. °· Limit caffeine. °· Avoid: °· Pop or fizzy drinks. °· Alcohol. °· Citrus juices. °· Acidic food. °· Salty or spicy food. °· Foods or drinks that are very hot. °· Hard or crunchy food. °Mouth Care °· Wash your hands well with soap and water before doing mouth care. °· Use fake saliva as told by your doctor. °· Use medicine on the sore places. °· Brush your teeth at least 2 times a day. Brush after each meal if possible. Rinse your mouth with water after each meal and after drinking a sweet drink. °· Brush slowly and gently in small circles. Do not brush side-to-side. °· Use regular toothpastes, but stay away from ones that have sodium laurel sulfate in them. °· Gargle with a baking soda mouthwash (½ teaspoon baking soda mixed in with 4 cups of water). °· Gargle with medicated mouthwash. °· Use dental floss or dental tape to clean between your teeth every day. °· Use a lanolin-based lip balm to keep  your lips from getting dry. °· If you wear dentures or bridges: °· You may need to leave them out until your doctor tells you to start wearing them again. °· Take them out at night if you wear them daily. Soak them in warm water or denture solution. Take your dentures out as much as you can during the day. Take them out when you use mouthwash. °· After each meal, brush your gums gently with a soft brush and rinse your mouth with water. °· If your dentures rub on your gums and cause a sore spot, have your dentist check and fix your dentures right away. °GET HELP RIGHT AWAY IF:  °· Your mouth gets more painful or dry. °· You have questions. °MAKE SURE YOU: °· Understand these instructions. °· Will watch your condition. °· Will get help right away if you are not doing well or get worse. °Document Released: 06/13/2009 Document Revised: 11/08/2011 Document Reviewed: 06/13/2009 °[employer]® Patient Information ©2014 [employer]. ° °

## 2013-08-06 NOTE — Telephone Encounter (Signed)
gv and printed papt sched na davs for pt for March 2015

## 2013-08-07 NOTE — Progress Notes (Signed)
[facility] OFFICE PROGRESS NOTE  [name],[name], MD [address] [place] [address]  DIAGNOSIS: Tonsil cancer - Plan: CBC with Differential, Comprehensive metabolic panel  Thrush, oral  Chief Complaint  Patient presents with  . Tonsil cancer   PAST THERAPY:  Concurrent chemo with q3 weeks Cisplatin and daily radiation between 11/13/2012 and 12/29/2012. His 3rd and last dose of chemo was canceled due to poor toleration.    CURRENT THERAPY:  Watchful observation.   INTERVAL HISTORY: CEEJAY Sawyer 72 y.o. male returns for regular follow up with his wife Omar Sawyer.   His strength is now better than before.  He is able to ambulate to the mailbox which is a long walk. He reports diminished taste overall.  He is not eating a lot.  The texture of the foods have changed for him given his dry mouth.   For example, bagels and breads are generally not tolerated.  He is doing biotin five times a day.   He denied tinnitus, but he has some hearing loss.  He denied palpable node in the neck.  His PEG tube was removed on 05/04/2013.  The rest of the 14-point review of system was negative.   MEDICAL HISTORY: Past Medical History  Diagnosis Date  . Essential hypertension, benign   . Mixed hyperlipidemia   . Unspecified sinusitis (chronic)   . Edema   . Personal history of adenomatous colonic polyps 07/23/2012    2010  . Hearing loss   . Hx of prostatitis   . 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 Anxiety     mild new dx  . GERD (gastroesophageal reflux disease)   . Neuromuscular disorder     b/l tremors,   . Basal cell carcinoma   . Mucositis 12/04/2012  . Renal insufficiency 12/16/2012  . Status post chemotherapy     concurrent chemo with q3 weeks Cisplatin and daily radiation between 11/13/2012 and 12/29/2012   . S/P radiation therapy 11/13/2012-12/29/2012      Right tonsil/ bilateral neck/ total dose 70 Gy in 35 fractions    INTERIM HISTORY: has ABDOMINAL PAIN, EPIGASTRIC; HTN (hypertension); Hyperlipidemia; Personal history of adenomatous colonic polyps; Cancer; Tonsil cancer; Mucositis; Acute renal failure; Dehydration; Nausea and vomiting; Renal insufficiency; and Thrush, oral on his problem list.    ALLERGIES:  has No Known Allergies.  MEDICATIONS: has a current medication list which includes the following prescription(s): aspirin, ensure plus, metoprolol succinate, omeprazole, sodium fluoride, sodium fluoride, suprep bowel prep, tamsulosin, and nystatin.  SURGICAL HISTORY:  Past Surgical History  Procedure Laterality Date  . Colonoscopy  2011    polyps. Dr. Leone Payor.  Due now 2013  . Biopsy of right tonsil Right 10/03/2012    Squamous Cell Carcinoma     . Blepharoplasty      REVIEW OF SYSTEMS:   Constitutional: Denies fevers, chills or abnormal weight loss Eyes: Denies blurriness of vision Ears, nose, mouth, throat, and face: Denies mucositis or sore throat Respiratory: Denies cough, dyspnea or wheezes Cardiovascular: Denies palpitation, chest discomfort or lower extremity swelling Gastrointestinal:  Denies nausea, heartburn or change in bowel habits Skin: Denies abnormal skin rashes Lymphatics: Denies new lymphadenopathy or easy bruising Neurological:Denies numbness, tingling or new weaknesses Behavioral/Psych: Mood is stable, no new changes  All other systems were reviewed with the patient and are negative.  PHYSICAL EXAMINATION: ECOG PERFORMANCE STATUS: 0 - Asymptomatic  Blood pressure 145/70, pulse 72, temperature 98.1 F (36.7 C), temperature source Oral, resp. rate 18, height 5\' 9"  (1.753 m), weight 171 lb 1.6 oz (77.61 kg).  GENERAL:alert, no distress and comfortable SKIN: skin color, texture, turgor are normal, no rashes or significant lesions; redness skin over left side  of throat EYES: normal, Conjunctiva are pink and non-injected, sclera clear OROPHARYNX:no exudate, no erythema and lips, buccal mucosa, and tongue with whitish strips over it NECK: supple, thyroid normal size, non-tender, without nodularity LYMPH:  no palpable lymphadenopathy in the cervical, axillary or inguinal LUNGS: clear to auscultation and percussion with normal breathing effort HEART: regular rate & rhythm and no murmurs and no lower extremity edema ABDOMEN:abdomen soft, non-tender and normal bowel sounds Musculoskeletal:no cyanosis of digits and no clubbing  NEURO: alert & oriented x 3 with fluent speech, no focal motor/sensory deficits  LABORATORY DATA: Results for orders placed in visit on 08/06/13 (from the past 48 hour(s))  CBC WITH DIFFERENTIAL     Status: Abnormal   Collection Time    08/06/13  8:35 AM      Result Value Range   WBC 6.1  4.0 - 10.3 10e3/uL   NEUT# 4.9  1.5 - 6.5 10e3/uL   HGB 12.8 (*) 13.0 - 17.1 g/dL   HCT 16.1 (*) 09.6 - 04.5 %   Platelets 163  140 - 400 10e3/uL   MCV 97.1  79.3 - 98.0 fL   MCH 33.2  27.2 - 33.4 pg   MCHC 34.2  32.0 - 36.0 g/dL   RBC 4.09 (*) 8.11 - 9.14 10e6/uL   RDW 13.0  11.0 - 14.6 %   lymph# 0.6 (*) 0.9 - 3.3 10e3/uL   MONO# 0.5  0.1 - 0.9 10e3/uL   Eosinophils Absolute 0.1  0.0 - 0.5 10e3/uL   Basophils Absolute 0.0  0.0 - 0.1 10e3/uL   NEUT% 79.9 (*) 39.0 - 75.0 %   LYMPH% 10.0 (*) 14.0 - 49.0 %   MONO% 8.3  0.0 - 14.0 %   EOS% 1.4  0.0 - 7.0 %   BASO% 0.4  0.0 - 2.0 %  TSH [facility]     Status: None   Collection Time    08/06/13  8:35 AM      Result Value Range   TSH 1.777  0.320 - 4.118 m(IU)/L  COMPREHENSIVE METABOLIC PANEL (CC13)     Status: Abnormal   Collection Time    08/06/13  8:35 AM      Result Value Range   Sodium 141  136 - 145 mEq/L   Potassium 4.2  3.5 - 5.1 mEq/L   Chloride 104  98 - 109 mEq/L   CO2 27  22 - 29 mEq/L   Glucose 139  70 - 140 mg/dl   BUN 78.2 (*) 7.0 - 95.6 mg/dL   Creatinine 1.3  0.7 -  1.3 mg/dL   Total Bilirubin 2.13  0.20 - 1.20 mg/dL   Alkaline Phosphatase 91  40 - 150 U/L   AST 17  5 - 34 U/L   ALT 19  0 - 55 U/L   Total Protein 7.1  6.4 - 8.3 g/dL   Albumin 4.0  3.5 - 5.0 g/dL   Calcium 08.6  8.4 - 57.8 mg/dL   Anion Gap 10  3 - 11 mEq/L    RADIOGRAPHIC STUDIES: Nm  Pet Image Restag (ps) Skull Base To Thigh  04/23/2013   *RADIOLOGY REPORT*  Clinical Data: Subsequent treatment strategy for restaging of tonsillar cancer.  NUCLEAR MEDICINE PET SKULL BASE TO THIGH  Fasting Blood Glucose:  92  Technique:  15.8 mCi F-18 FDG was injected intravenously. CT data was obtained and used for attenuation correction and anatomic localization only.  (This was not acquired as a diagnostic CT examination.) Additional exam technical data entered on technologist worksheet.  Comparison:  10/26/2012  Findings:  Neck: Interval resolution of the previously described right tonsillar hypermetabolic soft tissue mass.  Resolution of previously described right-sided level II and III nodal hypermetabolism.  Chest:  Mild hypermetabolism which corresponds to subpleural minimal right apical airspace disease on image 72/series 2.  This measures a S.U.V. max of 4.3.  Abdomen/Pelvis:  No abnormal hypermetabolism.  Skeleton:  Bilateral greater trochanteric hypermetabolism which is likely related to bursitis.  CT  images performed for attenuation correction demonstrate bilateral carotid atherosclerosis.  This is greater on the right than left.  Mild cardiomegaly with trace pericardial fluid or thickening. Trace right-sided pleural fluid.  Calcified right hilar lymph nodes, consistent with old granulomatous disease.  Punctate lower pole right renal calculus. Normal adrenal glands. Gastrostomy tube.  Moderate prostatomegaly.  IMPRESSION:  1.  Interval response to therapy of right tonsillar primary and right-sided cervical nodal metastasis. 2.  No evidence of new or metastatic disease. 3.  Minimal hypermetabolism  corresponding to right apical airspace disease.  Favored to be related to radiation change. 4.  Trace right-sided pleural fluid.   Original Report Authenticated By: Jeronimo Greaves, M.D.   Ir Gastrostomy Tube Removal  05/04/2013   *RADIOLOGY REPORT*  Clinical Data: *RADIOLOGY REPORT*  Clinical Data:  Tonsillar carcinoma, percutaneous gastric tube placed 10/2012, patient no longer needs g-tube, he is tolerating oral.  PERCUTANEOUS GASTROSTOMY TUBE REMOVAL  Gastrostomy tube removal was discussed with the patient and questions were answered. Risk of infection and bleeding were also discussed.  Written as well as verbal consent was obtained.  Medications: Viscous lidocaine applied in percutaneous gastric site.  The gastrostomy tube and surrounding skin were draped and prepped in sterile fashion.  The gastrostomy tube was removed in its entirety by applying manual traction.  Pressure was held until hemostasis was obtained.  The patient tolerated the procedure well with no immediate complications.  IMPRESSION: Successful 41F pull through gastrostomy tube removal.  Read By: Pattricia Boss PA-C   Original Report Authenticated By: Irish Lack, M.D.    ASSESSMENT: Tonsil cancer s/p concurrent chemo with q3 weeks Cisplatin and daily radiation between 11/13/2012 and 12/29/2012 now w/o evidence of disease.    PLAN:  1. Tonsil Cancer. --Follow-up recommendation per NCCN Guidelines: Year 1 H & P exam every 1-3 months; TSH every 6-12 months; speech/hearing and swallowing evaluation and rehabilitation as clinically indicated and dental evaluation. Continue supportive care.  Albumin is normal which is reassuring for his nutritional status.    2. Possible oral thrush.  --Provided Nystatin wash for areas of thrush.    3. Follow-up. -- Continue f/u per radiation oncology and ENT.  Follow up with medical oncology in 3 months.   All questions were answered. The patient knows to call the clinic with any problems, questions or  concerns. We can certainly see the patient much sooner if necessary.  I spent 15 minutes counseling the patient face to face. The total time spent in the appointment was 25 minutes.    Advaith Lamarque, MD  08/07/2013 6:53 AM

## 2013-08-17 ENCOUNTER — Encounter: Payer: Self-pay | Admitting: Internal Medicine

## 2013-08-17 ENCOUNTER — Ambulatory Visit (AMBULATORY_SURGERY_CENTER): Payer: Medicare Other | Admitting: Internal Medicine

## 2013-08-17 VITALS — BP 146/78 | HR 75 | Temp 96.1°F | Resp 16 | Ht 69.0 in | Wt 170.0 lb

## 2013-08-17 DIAGNOSIS — C099 Malignant neoplasm of tonsil, unspecified: Secondary | ICD-10-CM

## 2013-08-17 DIAGNOSIS — Z86010 Personal history of colonic polyps: Secondary | ICD-10-CM

## 2013-08-17 DIAGNOSIS — D126 Benign neoplasm of colon, unspecified: Secondary | ICD-10-CM

## 2013-08-17 DIAGNOSIS — C801 Malignant (primary) neoplasm, unspecified: Secondary | ICD-10-CM

## 2013-08-17 DIAGNOSIS — K5730 Diverticulosis of large intestine without perforation or abscess without bleeding: Secondary | ICD-10-CM

## 2013-08-17 MED ORDER — SODIUM CHLORIDE 0.9 % IV SOLN: 500.0000 mL | INTRAVENOUS | Status: DC

## 2013-08-17 NOTE — Op Note (Signed)
[facility] [address] [place], [address]   COLONOSCOPY PROCEDURE REPORT  PATIENT: Omar, Sawyer  MR#: [id number] BIRTHDATE: [birth year] , 72  yrs. old GENDER: Male ENDOSCOPIST: Iva Boop, MD, [facility] PROCEDURE DATE:  08/17/2013 PROCEDURE:   Colonoscopy with biopsy Prior Negative Screening - Now for repeat screening.  N/A History of Adenoma - Now for follow-up colonoscopy & has been > or = to 3 yrs.  Yes hx of adenoma.  Has been 3 or more years since last colonoscopy.  Polyps Removed Today? Yes. ASA CLASS:   Class II INDICATIONS:Patient's personal history of adenomatous colon polyps and Last colonoscopy performed 2010. MEDICATIONS: propofol (Diprivan) 200mg  IV, MAC sedation, administered by CRNA, and These medications were titrated to patient response per physician's verbal order  DESCRIPTION OF PROCEDURE:   After the risks benefits and alternatives of the procedure were thoroughly explained, informed consent was obtained.  A digital rectal exam revealed no prostatic nodules.   The LB [id number] and LB [id number] endoscope was introduced through the anus and advanced to the cecum, which was identified by both the appendix and ileocecal valve. No adverse events experienced.   The quality of the prep was excellent using Suprep  The instrument was then slowly withdrawn as the colon was fully examined.      COLON FINDINGS: A sessile polyp measuring 5 mm in size was found at the cecum.  A polypectomy was performed with a cold snare.  The resection was complete and the polyp tissue was completely retrieved.   Moderate diverticulosis was noted.   The colon mucosa was otherwise normal.   A right colon retroflexion was performed. Retroflexed views revealed no abnormalities. The time to cecum=2 minutes 44 seconds.  Withdrawal time=9 minutes 38 seconds.  The scope was withdrawn and the procedure completed. COMPLICATIONS: There were no  complications.  ENDOSCOPIC IMPRESSION: 1.   Sessile polyp measuring 5 mm in size was found at the cecum; polypectomy was performed with a cold snare 2.   Moderate diverticulosis was noted 3.   The colon mucosa was otherwise normal - excellent prep - hx adenomas 2010  RECOMMENDATIONS: Timing of repeat colonoscopy will be determined by pathology findings.   eSigned:  Iva Boop, MD, [facility] 08/17/2013 9:54 AM   cc: Lynne Leader, MD and The Patient

## 2013-08-17 NOTE — Progress Notes (Signed)
Called to room to assist during endoscopic procedure.  Patient ID and intended procedure confirmed with present staff. Received instructions for my participation in the procedure from the performing physician.  

## 2013-08-17 NOTE — Progress Notes (Signed)
Patient did not experience any of the following events: a burn prior to discharge; a fall within the facility; wrong site/side/patient/procedure/implant event; or a hospital transfer or hospital admission upon discharge from the facility. (G8907) Patient did not have preoperative order for IV antibiotic SSI prophylaxis. (G8918)  

## 2013-08-17 NOTE — Progress Notes (Signed)
Procedure ends, to recovery, report given and VSS. 

## 2013-08-17 NOTE — Patient Instructions (Addendum)
I found and removed one tiny polyp that looks benign. You also have a condition called diverticulosis - common and not usually a problem. Please read the handout provided.  The prostate remains enlarged - nothing suspicious. Be sure to have that checked regularly.  I will let you know pathology results and when to have another routine colonoscopy by mail.  I appreciate the opportunity to care for you. Iva Boop, MD, [facility]   Discharge instructions given with verbal understanding. Handouts on polyps and diverticulosis. Resume previous medications. YOU HAD AN ENDOSCOPIC PROCEDURE TODAY AT THE [facility]: Refer to the procedure report that was given to you for any specific questions about what was found during the examination.  If the procedure report does not answer your questions, please call your gastroenterologist to clarify.  If you requested that your care partner not be given the details of your procedure findings, then the procedure report has been included in a sealed envelope for you to review at your convenience later.  YOU SHOULD EXPECT: Some feelings of bloating in the abdomen. Passage of more gas than usual.  Walking can help get rid of the air that was put into your GI tract during the procedure and reduce the bloating. If you had a lower endoscopy (such as a colonoscopy or flexible sigmoidoscopy) you may notice spotting of blood in your stool or on the toilet paper. If you underwent a bowel prep for your procedure, then you may not have a normal bowel movement for a few days.  DIET: Your first meal following the procedure should be a light meal and then it is ok to progress to your normal diet.  A half-sandwich or bowl of soup is an example of a good first meal.  Heavy or fried foods are harder to digest and may make you feel nauseous or bloated.  Likewise meals heavy in dairy and vegetables can cause extra gas to form and this can also increase the bloating.  Drink  plenty of fluids but you should avoid alcoholic beverages for 24 hours.  ACTIVITY: Your care partner should take you home directly after the procedure.  You should plan to take it easy, moving slowly for the rest of the day.  You can resume normal activity the day after the procedure however you should NOT DRIVE or use heavy machinery for 24 hours (because of the sedation medicines used during the test).    SYMPTOMS TO REPORT IMMEDIATELY: A gastroenterologist can be reached at any hour.  During normal business hours, 8:30 AM to 5:00 PM Monday through Friday, call [phone].  After hours and on weekends, please call the GI answering service at [phone] who will take a message and have the physician on call contact you.   Following lower endoscopy (colonoscopy or flexible sigmoidoscopy):  Excessive amounts of blood in the stool  Significant tenderness or worsening of abdominal pains  Swelling of the abdomen that is new, acute  Fever of 100F or higher  FOLLOW UP: If any biopsies were taken you will be contacted by phone or by letter within the next 1-3 weeks.  Call your gastroenterologist if you have not heard about the biopsies in 3 weeks.  Our staff will call the home number listed on your records the next business day following your procedure to check on you and address any questions or concerns that you may have at that time regarding the information given to you following your procedure. This is  a courtesy call and so if there is no answer at the home number and we have not heard from you through the emergency physician on call, we will assume that you have returned to your regular daily activities without incident.  SIGNATURES/CONFIDENTIALITY: You and/or your care partner have signed paperwork which will be entered into your electronic medical record.  These signatures attest to the fact that that the information above on your After Visit Summary has been reviewed and is understood.   Full responsibility of the confidentiality of this discharge information lies with you and/or your care-partner.

## 2013-08-20 ENCOUNTER — Telehealth: Payer: Self-pay

## 2013-08-20 NOTE — Telephone Encounter (Signed)
  Follow up Call-  Call back number 08/17/2013  Post procedure Call Back phone  # [phone]  Permission to leave phone message Yes     Patient questions:  Do you have a fever, pain , or abdominal swelling? no Pain Score  0 *  Have you tolerated food without any problems? yes  Have you been able to return to your normal activities? yes  Do you have any questions about your discharge instructions: Diet   no Medications  no Follow up visit  no  Do you have questions or concerns about your Care? no  Actions: * If pain score is 4 or above: No action needed, pain <4.  No problems per the pt. Maw

## 2013-08-26 ENCOUNTER — Encounter: Payer: Self-pay | Admitting: Internal Medicine

## 2013-08-26 DIAGNOSIS — Z86010 Personal history of colonic polyps: Secondary | ICD-10-CM

## 2013-10-03 NOTE — Progress Notes (Signed)
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   He is doing well. His appetite is still not back to normal as his taste buds have not fully recovered. His weight is relatively stable over the past 9 months. He is following closely with medical oncology and otolaryngology. He denies any lumps or bumps in his neck. He has completed physical therapy but still performs compression on his own for neck edema. This has improved with physical therapy. His voice is a little bit nasally. He says that dried thickened saliva in his mouth is still an issue but has improved. He follows closely with his dentist and provides fluoride treatments for himself at home. He is still somewhat tired but his last TSH lab was normal. His PEG tube has been removed                     ALLERGIES:  is allergic to hydrocodone.  Meds: Current Outpatient Prescriptions  Medication Sig Dispense Refill  . aspirin 81 MG tablet Take 81 mg by mouth daily.      Marland Kitchen ENSURE PLUS (ENSURE PLUS) LIQD Take 237 mLs by mouth. 6 cans daily      . metoprolol succinate (TOPROL-XL) 50 MG 24 hr tablet Take 75 mg by mouth daily before breakfast.       . omeprazole (PRILOSEC) 20 MG capsule Take 20 mg by mouth daily.       . sodium fluoride (FLUORISHIELD) 1.1 % GEL dental gel Brush and floss. Instill one drop of fluoride into fluoride tray. Place over teeth for 5 minutes. Remove. Spit out excess. Repeat nightly  120 mL  prn  . sodium fluoride (PREVIDENT 5000 PLUS) 1.1 % CREA  dental cream Apply thin ribbon to tooth brush. Brush teeth for 2 minutes. Spit out excess-DO NOT swallow. DO NOT rinse afterwards. Repeat nightly.  1 Tube  prn  . tamsulosin (FLOMAX) 0.4 MG CAPS Take 0.4 mg by mouth daily.       No current facility-administered medications for this encounter.    Physical Findings: The patient is in no acute distress. Patient is alert and oriented.  height is 5\' 9"  (1.753 m) and weight is 172 lb 4.8 oz (78.155 kg). His temperature is 97.7 F (36.5 C). His blood pressure is 143/63 and his pulse is 66. Marland Kitchen Oropharynx is clear with no signs of tumor. There is a white film on his tongue but no obvious colonies of thrush. Anterior Neck demonstrates lymphedema/ tissue plethora, no palpable cervical or supraclavicular lymph nodes  Lab Findings: Lab Results  Component Value Date   WBC 6.1 08/06/2013   HGB 12.8* 08/06/2013   HCT 37.5* 08/06/2013   MCV 97.1 08/06/2013   PLT 163 08/06/2013    Lab Results  Component Value Date   TSH 1.777 08/06/2013    Radiographic Findings: No results found.  Impression/Plan:    1) Head and Neck Cancer Status:  No evidence of disease  2) Nutritional Status: - weight: stable - PEG tube: removed  3) Risk Factors: The patient has been educated about risk factors including alcohol and tobacco abuse; they understand that avoidance of alcohol and tobacco is important to prevent recurrences as well as other cancers  4) Swallowing: no complaints  5) Dental: Encouraged to continue regular followup with dentistry, and dental hygiene including fluoride rinses.  He is compliant with this  6) Energy:  Normal TSH in December  7) Social: No active social issues to address at this time  8) Other: xerostomia, improving. Lymphedema, improved with physical therapy.  9) Follow-up in 4 months. The patient was encouraged to call with any issues or questions before then. according to the NCCN guidelines, imaging will only be ordered if  concerning symptoms or signs arise  I spent 20 minutes minutes face to face with the patient and more than 50% of that time was spent in counseling and/or coordination of care. _____________________________________   Eppie Gibson, MD

## 2013-10-05 ENCOUNTER — Encounter: Payer: Self-pay | Admitting: Radiation Oncology

## 2013-10-05 ENCOUNTER — Ambulatory Visit
Admission: RE | Admit: 2013-10-05 | Discharge: 2013-10-05 | Disposition: A | Discharge status: Home / Self Care | Payer: Medicare Other | Source: Ambulatory Visit | Attending: Radiation Oncology | Admitting: Radiation Oncology

## 2013-10-05 VITALS — BP 143/63 | HR 66 | Temp 97.7°F | Ht 69.0 in | Wt 172.3 lb

## 2013-10-05 DIAGNOSIS — C099 Malignant neoplasm of tonsil, unspecified: Secondary | ICD-10-CM

## 2013-10-05 NOTE — Progress Notes (Signed)
Omar Sawyer here for assessment s/p radiation therapy for squamous cell carcinoma of the tonsil.  He continues to have a decreased appetite, dry,and thickened saliva(which  He states he better).  He has a very husky resonate voice presently.

## 2013-11-05 ENCOUNTER — Encounter: Payer: Self-pay | Admitting: Internal Medicine

## 2013-11-05 ENCOUNTER — Other Ambulatory Visit (HOSPITAL_BASED_OUTPATIENT_CLINIC_OR_DEPARTMENT_OTHER): Payer: Medicare Other

## 2013-11-05 ENCOUNTER — Ambulatory Visit (HOSPITAL_BASED_OUTPATIENT_CLINIC_OR_DEPARTMENT_OTHER): Payer: Medicare Other | Admitting: Internal Medicine

## 2013-11-05 ENCOUNTER — Telehealth: Payer: Self-pay | Admitting: Internal Medicine

## 2013-11-05 VITALS — BP 128/56 | HR 72 | Temp 98.0°F | Resp 18 | Ht 69.0 in | Wt 173.0 lb

## 2013-11-05 DIAGNOSIS — D649 Anemia, unspecified: Secondary | ICD-10-CM

## 2013-11-05 DIAGNOSIS — C099 Malignant neoplasm of tonsil, unspecified: Secondary | ICD-10-CM

## 2013-11-05 LAB — COMPREHENSIVE METABOLIC PANEL (CC13)
ALBUMIN: 3.6 g/dL (ref 3.5–5.0)
ALK PHOS: 91 U/L (ref 40–150)
ALT: 21 U/L (ref 0–55)
AST: 16 U/L (ref 5–34)
Anion Gap: 7 mEq/L (ref 3–11)
BILIRUBIN TOTAL: 0.28 mg/dL (ref 0.20–1.20)
BUN: 31 mg/dL — ABNORMAL HIGH (ref 7.0–26.0)
CO2: 30 mEq/L — ABNORMAL HIGH (ref 22–29)
Calcium: 9.5 mg/dL (ref 8.4–10.4)
Chloride: 103 mEq/L (ref 98–109)
Creatinine: 1.3 mg/dL (ref 0.7–1.3)
Glucose: 152 mg/dl — ABNORMAL HIGH (ref 70–140)
POTASSIUM: 4.2 meq/L (ref 3.5–5.1)
SODIUM: 140 meq/L (ref 136–145)
TOTAL PROTEIN: 6.5 g/dL (ref 6.4–8.3)

## 2013-11-05 LAB — CBC WITH DIFFERENTIAL/PLATELET
BASO%: 0.5 % (ref 0.0–2.0)
Basophils Absolute: 0 10*3/uL (ref 0.0–0.1)
EOS ABS: 0.2 10*3/uL (ref 0.0–0.5)
EOS%: 4.5 % (ref 0.0–7.0)
HCT: 35.3 % — ABNORMAL LOW (ref 38.4–49.9)
HGB: 11.8 g/dL — ABNORMAL LOW (ref 13.0–17.1)
LYMPH%: 10.9 % — AB (ref 14.0–49.0)
MCH: 32.1 pg (ref 27.2–33.4)
MCHC: 33.5 g/dL (ref 32.0–36.0)
MCV: 95.8 fL (ref 79.3–98.0)
MONO#: 0.4 10*3/uL (ref 0.1–0.9)
MONO%: 7.8 % (ref 0.0–14.0)
NEUT%: 76.3 % — ABNORMAL HIGH (ref 39.0–75.0)
NEUTROS ABS: 4.2 10*3/uL (ref 1.5–6.5)
PLATELETS: 165 10*3/uL (ref 140–400)
RBC: 3.69 10*6/uL — AB (ref 4.20–5.82)
RDW: 13.6 % (ref 11.0–14.6)
WBC: 5.4 10*3/uL (ref 4.0–10.3)
lymph#: 0.6 10*3/uL — ABNORMAL LOW (ref 0.9–3.3)

## 2013-11-05 LAB — TECHNOLOGIST REVIEW

## 2013-11-05 NOTE — Telephone Encounter (Signed)
gv adn pritned aptp sched adn avs for pt for May and July.Marland KitchenMarland KitchenMarland Kitchen

## 2013-11-05 NOTE — Patient Instructions (Signed)

## 2013-11-06 DIAGNOSIS — D649 Anemia, unspecified: Secondary | ICD-10-CM | POA: Insufficient documentation

## 2013-11-06 NOTE — Progress Notes (Signed)
[place], [place] [address]  DIAGNOSIS: Anemia, unspecified - Plan: CBC with Differential, Ferritin, Iron and TIBC  Tonsil cancer - Plan: CBC with Differential, Comprehensive metabolic panel, TSH  Chief Complaint  Patient presents with  . Tonsil cancer   PAST THERAPY:  Concurrent chemo with q3 weeks Cisplatin and daily radiation between 11/13/2012 and 12/29/2012. His 3rd and last dose of chemo was canceled due to poor toleration.    CURRENT THERAPY:  Watchful observation.   INTERVAL HISTORY: Omar Sawyer 73 y.o. male returns for regular follow up with his wife Omar Sawyer.   He was last seen by me on 08/06/2013.  He reports a one pound weight gain. His appetite is slowly recovering. He performs compression for his neck edema which he reports has improved over time.    He is doing biotin five times a day.   He denied tinnitus, but he has some hearing loss.  He denied palpable node in the neck.  His PEG tube was removed on 05/04/2013.  The rest of the 14-point review of system was negative.   MEDICAL HISTORY: Past Medical History  Diagnosis Date  . Essential hypertension, benign   . Mixed hyperlipidemia   . Unspecified sinusitis (chronic)   . Edema   . Personal history of adenomatous colonic polyps 07/23/2012    2010  . Hearing loss   . Hx of prostatitis   . 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 Anxiety     mild new dx  . GERD (gastroesophageal reflux disease)   . Neuromuscular disorder     b/l tremors,   . Basal cell carcinoma   . Mucositis 12/04/2012  . Renal insufficiency 12/16/2012  . Status post chemotherapy     concurrent chemo with q3 weeks Cisplatin and daily radiation between 11/13/2012 and 12/29/2012  . S/P radiation therapy  11/13/2012-12/29/2012      Right tonsil/ bilateral neck/ total dose 70 Gy in 35 fractions  . Thrush, oral 05/15/2013  . Renal insufficiency 12/16/2012    INTERIM HISTORY: has HTN (hypertension); Hyperlipidemia; Personal history of adenomatous colonic polyps; Tonsil cancer; Renal insufficiency; and Anemia, unspecified on his problem list.    ALLERGIES:  is allergic to hydrocodone.  MEDICATIONS: has a current medication list which includes the following prescription(s): aspirin, ensure plus, metoprolol succinate, omeprazole, sodium fluoride, and tamsulosin.  SURGICAL HISTORY:  Past Surgical History  Procedure Laterality Date  . Colonoscopy  2011    polyps. Dr. Carlean Purl.  Due now 2013  . Biopsy of right tonsil Right 10/03/2012    Squamous Cell Carcinoma     . Blepharoplasty      REVIEW OF SYSTEMS:   Constitutional: Denies fevers, chills or abnormal weight loss Eyes: Denies blurriness of vision Ears, nose, mouth, throat, and face: Denies mucositis or sore throat Respiratory: Denies cough, dyspnea or wheezes Cardiovascular: Denies palpitation, chest discomfort or lower extremity swelling Gastrointestinal:  Denies nausea, heartburn or change in bowel habits Skin: Denies abnormal skin rashes Lymphatics: Denies new lymphadenopathy or easy bruising Neurological:Denies numbness, tingling or new weaknesses Behavioral/Psych: Mood is stable, no new changes  All other systems were reviewed with the patient and are negative.  PHYSICAL EXAMINATION: ECOG PERFORMANCE STATUS: 0 -  Asymptomatic  Blood pressure 128/56, pulse 72, temperature 98 F (36.7 C), temperature source Oral, resp. rate 18, height 5\' 9"  (1.753 m), weight 173 lb (78.472 kg).  GENERAL:alert, no distress and comfortable SKIN: skin color, texture, turgor are normal, no rashes or significant lesions; redness skin over left side of throat EYES: normal, Conjunctiva are pink and non-injected, sclera clear OROPHARYNX:no exudate, no  erythema and lips, buccal mucosa NECK: supple, thyroid normal size, non-tender, without nodularity; Anterior Neck demonstrates tissue plethora LYMPH:  no palpable lymphadenopathy in the cervical, axillary or inguinal LUNGS: clear to auscultation and percussion with normal breathing effort HEART: regular rate & rhythm and no murmurs and no lower extremity edema ABDOMEN:abdomen soft, non-tender and normal bowel sounds Musculoskeletal:no cyanosis of digits and no clubbing  NEURO: alert & oriented x 3 with fluent speech, no focal motor/sensory deficits  LABORATORY DATA: Results for orders placed in visit on 11/05/13 (from the past 48 hour(s))  CBC WITH DIFFERENTIAL     Status: Abnormal   Collection Time    11/05/13  7:57 AM      Result Value Ref Range   WBC 5.4  4.0 - 10.3 10e3/uL   NEUT# 4.2  1.5 - 6.5 10e3/uL   HGB 11.8 (*) 13.0 - 17.1 g/dL   HCT 35.3 (*) 38.4 - 49.9 %   Platelets 165  140 - 400 10e3/uL   MCV 95.8  79.3 - 98.0 fL   MCH 32.1  27.2 - 33.4 pg   MCHC 33.5  32.0 - 36.0 g/dL   RBC 3.69 (*) 4.20 - 5.82 10e6/uL   RDW 13.6  11.0 - 14.6 %   lymph# 0.6 (*) 0.9 - 3.3 10e3/uL   MONO# 0.4  0.1 - 0.9 10e3/uL   Eosinophils Absolute 0.2  0.0 - 0.5 10e3/uL   Basophils Absolute 0.0  0.0 - 0.1 10e3/uL   NEUT% 76.3 (*) 39.0 - 75.0 %   LYMPH% 10.9 (*) 14.0 - 49.0 %   MONO% 7.8  0.0 - 14.0 %   EOS% 4.5  0.0 - 7.0 %   BASO% 0.5  0.0 - 2.0 %  TECHNOLOGIST REVIEW     Status: None   Collection Time    11/05/13  7:57 AM      Result Value Ref Range   Technologist Review Metas and Myelocytes present    COMPREHENSIVE METABOLIC PANEL (ZD63)     Status: Abnormal   Collection Time    11/05/13  7:57 AM      Result Value Ref Range   Sodium 140  136 - 145 mEq/L   Potassium 4.2  3.5 - 5.1 mEq/L   Chloride 103  98 - 109 mEq/L   CO2 30 (*) 22 - 29 mEq/L   Glucose 152 (*) 70 - 140 mg/dl   BUN 31.0 (*) 7.0 - 26.0 mg/dL   Creatinine 1.3  0.7 - 1.3 mg/dL   Total Bilirubin 0.28  0.20 - 1.20 mg/dL    Alkaline Phosphatase 91  40 - 150 U/L   AST 16  5 - 34 U/L   ALT 21  0 - 55 U/L   Total Protein 6.5  6.4 - 8.3 g/dL   Albumin 3.6  3.5 - 5.0 g/dL   Calcium 9.5  8.4 - 10.4 mg/dL   Anion Gap 7  3 - 11 mEq/L    RADIOGRAPHIC STUDIES: Nm Pet Image Restag (ps) Skull Base To Thigh  04/23/2013   *RADIOLOGY REPORT*  Clinical Data: Subsequent treatment  strategy for restaging of tonsillar cancer.  NUCLEAR MEDICINE PET SKULL BASE TO THIGH  Fasting Blood Glucose:  92  Technique:  15.8 mCi F-18 FDG was injected intravenously. CT data was obtained and used for attenuation correction and anatomic localization only.  (This was not acquired as a diagnostic CT examination.) Additional exam technical data entered on technologist worksheet.  Comparison:  10/26/2012  Findings:  Neck: Interval resolution of the previously described right tonsillar hypermetabolic soft tissue mass.  Resolution of previously described right-sided level II and III nodal hypermetabolism.  Chest:  Mild hypermetabolism which corresponds to subpleural minimal right apical airspace disease on image 72/series 2.  This measures a S.U.V. max of 4.3.  Abdomen/Pelvis:  No abnormal hypermetabolism.  Skeleton:  Bilateral greater trochanteric hypermetabolism which is likely related to bursitis.  CT  images performed for attenuation correction demonstrate bilateral carotid atherosclerosis.  This is greater on the right than left.  Mild cardiomegaly with trace pericardial fluid or thickening. Trace right-sided pleural fluid.  Calcified right hilar lymph nodes, consistent with old granulomatous disease.  Punctate lower pole right renal calculus. Normal adrenal glands. Gastrostomy tube.  Moderate prostatomegaly.  IMPRESSION:  1.  Interval response to therapy of right tonsillar primary and right-sided cervical nodal metastasis. 2.  No evidence of new or metastatic disease. 3.  Minimal hypermetabolism corresponding to right apical airspace disease.  Favored to be  related to radiation change. 4.  Trace right-sided pleural fluid.   Original Report Authenticated By: Abigail Miyamoto, M.D.   Ir Gastrostomy Tube Removal  05/04/2013   *RADIOLOGY REPORT*  Clinical Data: *RADIOLOGY REPORT*  Clinical Data:  Tonsillar carcinoma, percutaneous gastric tube placed 10/2012, patient no longer needs g-tube, he is tolerating oral.  PERCUTANEOUS GASTROSTOMY TUBE REMOVAL  Gastrostomy tube removal was discussed with the patient and questions were answered. Risk of infection and bleeding were also discussed.  Written as well as verbal consent was obtained.  Medications: Viscous lidocaine applied in percutaneous gastric site.  The gastrostomy tube and surrounding skin were draped and prepped in sterile fashion.  The gastrostomy tube was removed in its entirety by applying manual traction.  Pressure was held until hemostasis was obtained.  The patient tolerated the procedure well with no immediate complications.  IMPRESSION: Successful 34F pull through gastrostomy tube removal.  Read By: Tsosie Billing PA-C   Original Report Authenticated By: Aletta Edouard, M.D.    ASSESSMENT: Tonsil cancer s/p concurrent chemo with q3 weeks Cisplatin and daily radiation between 11/13/2012 and 12/29/2012 now w/o evidence of disease.    PLAN:  1. Tonsil Cancer. --He continues to do well. Follow-up recommendation per NCCN Guidelines: Year 1 H & P exam every 1-3 months; TSH every 6-12 months; speech/hearing and swallowing evaluation and rehabilitation as clinically indicated and dental evaluation. Continue supportive care.  Albumin is normal which is reassuring for his nutritional status. December, TSH was within normal limits.  We will repeat in 3 months.  2. Anemia NOS. --We will check labs in one month and include iron studies.  He is asymptomatic for symptoms of anemia.    3. Follow-up. -- Continue f/u per radiation oncology and ENT.  Follow up with medical oncology in 3 months.   All questions were  answered. The patient knows to call the clinic with any problems, questions or concerns. We can certainly see the patient much sooner if necessary.  I spent 15 minutes counseling the patient face to face. The total time spent in the appointment was 25  minutes.    Cris Gibby, MD 11/06/2013 6:03 AM

## 2013-12-23 IMAGING — XA IR TUBE REMOVAL GASTROSTOMY
1 series · 1 of 1 positions shown · non-contrast
Comparison: none

CLINICAL DATA: *RADIOLOGY REPORT*
CLINICAL DATA: Tonsillar carcinoma, percutaneous gastric tube
placed [DATE], patient no longer needs g-tube, he is tolerating
oral.

[Series 1: care single · 1 of 1 slices shown]
[im 1/1]
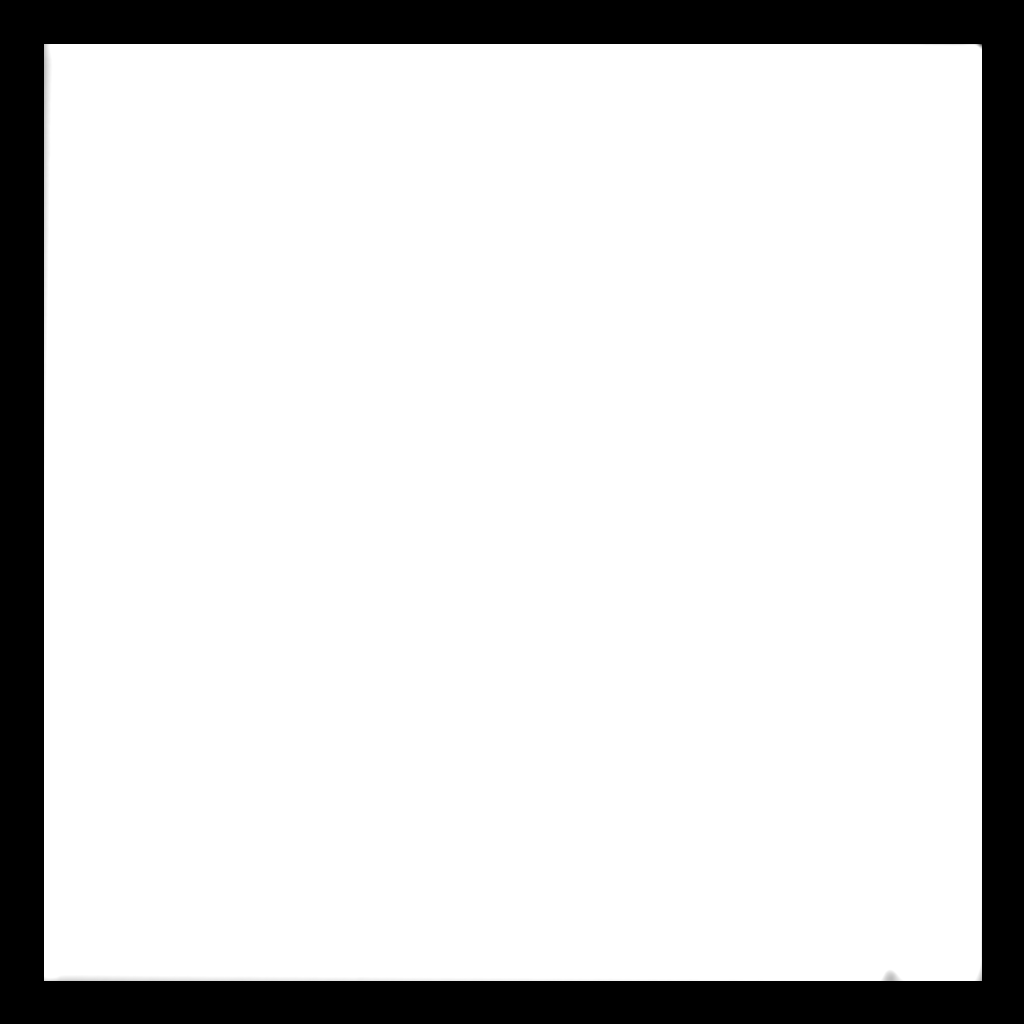

[1 of 1 positions shown; findings below may reference images not displayed]

PERCUTANEOUS GASTROSTOMY TUBE REMOVAL

Gastrostomy tube removal was discussed with the patient and
questions were answered. Risk of infection and bleeding were also
discussed.  Written as well as verbal consent was obtained.

Medications: Viscous lidocaine applied in percutaneous gastric
site.

The gastrostomy tube and surrounding skin were draped and prepped
in sterile fashion.  The gastrostomy tube was removed in its
entirety by applying manual traction.  Pressure was held until
hemostasis was obtained.  The patient tolerated the procedure well
with no immediate complications.
IMPRESSION: Successful 20F pull through gastrostomy tube removal.

## 2013-12-31 ENCOUNTER — Other Ambulatory Visit (HOSPITAL_BASED_OUTPATIENT_CLINIC_OR_DEPARTMENT_OTHER): Payer: Medicare Other

## 2013-12-31 DIAGNOSIS — C099 Malignant neoplasm of tonsil, unspecified: Secondary | ICD-10-CM

## 2013-12-31 DIAGNOSIS — D649 Anemia, unspecified: Secondary | ICD-10-CM

## 2013-12-31 LAB — CBC WITH DIFFERENTIAL/PLATELET
BASO%: 0.3 % (ref 0.0–2.0)
BASOS ABS: 0 10*3/uL (ref 0.0–0.1)
EOS%: 2.1 % (ref 0.0–7.0)
Eosinophils Absolute: 0.1 10*3/uL (ref 0.0–0.5)
HEMATOCRIT: 38.9 % (ref 38.4–49.9)
HEMOGLOBIN: 13 g/dL (ref 13.0–17.1)
LYMPH%: 11.3 % — AB (ref 14.0–49.0)
MCH: 31.6 pg (ref 27.2–33.4)
MCHC: 33.4 g/dL (ref 32.0–36.0)
MCV: 94.7 fL (ref 79.3–98.0)
MONO#: 0.4 10*3/uL (ref 0.1–0.9)
MONO%: 7.9 % (ref 0.0–14.0)
NEUT#: 4 10*3/uL (ref 1.5–6.5)
NEUT%: 78.4 % — AB (ref 39.0–75.0)
PLATELETS: 149 10*3/uL (ref 140–400)
RBC: 4.11 10*6/uL — ABNORMAL LOW (ref 4.20–5.82)
RDW: 13.6 % (ref 11.0–14.6)
WBC: 5.2 10*3/uL (ref 4.0–10.3)
lymph#: 0.6 10*3/uL — ABNORMAL LOW (ref 0.9–3.3)

## 2013-12-31 LAB — IRON AND TIBC CHCC
%SAT: 38 % (ref 20–55)
Iron: 98 ug/dL (ref 42–163)
TIBC: 258 ug/dL (ref 202–409)
UIBC: 160 ug/dL (ref 117–376)

## 2013-12-31 LAB — FERRITIN CHCC: Ferritin: 232 ng/ml (ref 22–316)

## 2014-02-01 ENCOUNTER — Ambulatory Visit
Admission: RE | Admit: 2014-02-01 | Discharge: 2014-02-01 | Disposition: A | Discharge status: Home / Self Care | Payer: Medicare Other | Source: Ambulatory Visit | Attending: Radiation Oncology | Admitting: Radiation Oncology

## 2014-02-01 ENCOUNTER — Encounter: Payer: Self-pay | Admitting: Radiation Oncology

## 2014-02-01 VITALS — BP 152/72 | HR 55 | Temp 97.7°F | Resp 14 | Wt 175.1 lb

## 2014-02-01 DIAGNOSIS — C099 Malignant neoplasm of tonsil, unspecified: Secondary | ICD-10-CM

## 2014-02-01 NOTE — Progress Notes (Signed)
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  No pain.   Consumes 6 cans on Ensure daily and enjoys popcorn. 2lb weight gain noted. Pt cont to have dry mouth, thick rope like saliva present and makes pt cough to clear airway. No difficulty swallowing as long as food is chewed well. Denies nausea, vomiting, night sweats. Reports taking naps as needed and sleeps well at night. Saw ENT last month. Reports seeing dentist every 4 months as well as medical oncologist. Received good report from all providers.  His taste is improving although sweet foods still do not taste the same. He is sleeping well.                           ALLERGIES:  is allergic to hydrocodone.  Meds: Current Outpatient Prescriptions  Medication Sig Dispense Refill  . aspirin 81 MG tablet Take 81 mg by mouth daily.      Marland Kitchen ENSURE PLUS (ENSURE PLUS) LIQD Take 237 mLs by mouth. 6 cans daily      . metoprolol succinate (TOPROL-XL) 50 MG 24 hr tablet Take 75 mg by mouth daily before breakfast.       . omeprazole (PRILOSEC) 20 MG capsule Take 20 mg by mouth daily.       . tamsulosin (FLOMAX) 0.4 MG CAPS Take 0.4 mg by mouth daily.       No current facility-administered medications for this encounter.    Physical Findings: The patient is in no acute distress. Patient is alert and oriented.  weight is 175 lb 1.6 oz (79.425 kg). His oral temperature is 97.7 F (36.5 C). His blood pressure is 152/72 and his pulse is 55. His respiration is 14 and oxygen  saturation is 100%. . No oral or pharyngeal lesions appreciated. No thrush. There is still a white film on his tongue but this does not appear consistent with yeast. 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 There is no palpable cervical or supraclavicular lymphadenopathy.  Lab Findings: Lab Results  Component Value Date   WBC 5.2 12/31/2013   HGB 13.0 12/31/2013   HCT 38.9 12/31/2013   MCV 94.7 12/31/2013   PLT 149 12/31/2013    Lab Results  Component Value Date   TSH 1.777 08/06/2013    Radiographic Findings: No results found.  Impression/Plan:    1) Head and Neck Cancer Status: No evidence of disease  2) Nutritional Status: No active issues. His PEG tube has been removed  3) Risk Factors: The patient has been educated about risk factors including alcohol and tobacco abuse; they understand that avoidance of alcohol and tobacco is important to prevent recurrences as well as other cancers  4) Swallowing: Good function  5) Dental: Encouraged to continue regular followup with dentistry, and dental hygiene including fluoride rinses.   6) Energy: good; normal TSH in Dec. It appears med onc is checking this again in July.  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 He has pursued physical therapy. This is not bothersome to him. Will refer back to physical therapy if patient desires.  9) Follow-up in 4  months. The patient was encouraged to call with any issues or questions before then.  _____________________________________   Eppie Gibson, MD

## 2014-02-01 NOTE — Progress Notes (Signed)
Pt denies pain, rating 0 out of 10.  Consumes 6 cans on Ensure daily and enjoys popcorn.  2lb weight gain noted.  Pt cont to have dry mouth, thick rope like saliva present and makes pt cough to clear airway.  No difficulty swallowing as long as food is chewed well.  Denies nausea, vomiting, night sweats.  Reports taking naps as needed and sleeps well at night.  Saw ENT last month.  Reports seeing dentist every 4 months as well as medical oncologist.  Received good report from all providers.

## 2014-02-22 ENCOUNTER — Other Ambulatory Visit (HOSPITAL_COMMUNITY): Payer: Self-pay | Admitting: Dentistry

## 2014-02-26 ENCOUNTER — Other Ambulatory Visit (HOSPITAL_COMMUNITY): Payer: Self-pay | Admitting: Dentistry

## 2014-03-04 ENCOUNTER — Ambulatory Visit (HOSPITAL_BASED_OUTPATIENT_CLINIC_OR_DEPARTMENT_OTHER): Payer: Medicare Other | Admitting: Internal Medicine

## 2014-03-04 ENCOUNTER — Encounter: Payer: Self-pay | Admitting: Internal Medicine

## 2014-03-04 ENCOUNTER — Telehealth: Payer: Self-pay | Admitting: Internal Medicine

## 2014-03-04 ENCOUNTER — Other Ambulatory Visit (HOSPITAL_BASED_OUTPATIENT_CLINIC_OR_DEPARTMENT_OTHER): Payer: Medicare Other

## 2014-03-04 VITALS — BP 139/59 | HR 57 | Temp 97.8°F | Resp 18 | Ht 69.0 in | Wt 174.4 lb

## 2014-03-04 DIAGNOSIS — N189 Chronic kidney disease, unspecified: Secondary | ICD-10-CM

## 2014-03-04 DIAGNOSIS — D649 Anemia, unspecified: Secondary | ICD-10-CM

## 2014-03-04 DIAGNOSIS — C099 Malignant neoplasm of tonsil, unspecified: Secondary | ICD-10-CM

## 2014-03-04 DIAGNOSIS — N289 Disorder of kidney and ureter, unspecified: Secondary | ICD-10-CM

## 2014-03-04 LAB — COMPREHENSIVE METABOLIC PANEL (CC13)
ALT: 22 U/L (ref 0–55)
ANION GAP: 7 meq/L (ref 3–11)
AST: 17 U/L (ref 5–34)
Albumin: 3.8 g/dL (ref 3.5–5.0)
Alkaline Phosphatase: 85 U/L (ref 40–150)
BUN: 34.2 mg/dL — ABNORMAL HIGH (ref 7.0–26.0)
CO2: 30 meq/L — AB (ref 22–29)
CREATININE: 1.4 mg/dL — AB (ref 0.7–1.3)
Calcium: 9.7 mg/dL (ref 8.4–10.4)
Chloride: 103 mEq/L (ref 98–109)
Glucose: 104 mg/dl (ref 70–140)
Potassium: 4.8 mEq/L (ref 3.5–5.1)
Sodium: 140 mEq/L (ref 136–145)
Total Bilirubin: 0.48 mg/dL (ref 0.20–1.20)
Total Protein: 6.8 g/dL (ref 6.4–8.3)

## 2014-03-04 LAB — CBC WITH DIFFERENTIAL/PLATELET
BASO%: 0.2 % (ref 0.0–2.0)
Basophils Absolute: 0 10*3/uL (ref 0.0–0.1)
EOS%: 2 % (ref 0.0–7.0)
Eosinophils Absolute: 0.1 10*3/uL (ref 0.0–0.5)
HEMATOCRIT: 38.6 % (ref 38.4–49.9)
HGB: 12.8 g/dL — ABNORMAL LOW (ref 13.0–17.1)
LYMPH%: 13.5 % — AB (ref 14.0–49.0)
MCH: 31.1 pg (ref 27.2–33.4)
MCHC: 33.2 g/dL (ref 32.0–36.0)
MCV: 93.9 fL (ref 79.3–98.0)
MONO#: 0.6 10*3/uL (ref 0.1–0.9)
MONO%: 10.5 % (ref 0.0–14.0)
NEUT#: 4 10*3/uL (ref 1.5–6.5)
NEUT%: 73.8 % (ref 39.0–75.0)
Platelets: 159 10*3/uL (ref 140–400)
RBC: 4.11 10*6/uL — ABNORMAL LOW (ref 4.20–5.82)
RDW: 13.1 % (ref 11.0–14.6)
WBC: 5.4 10*3/uL (ref 4.0–10.3)
lymph#: 0.7 10*3/uL — ABNORMAL LOW (ref 0.9–3.3)

## 2014-03-04 LAB — TSH CHCC: TSH: 2.887 m(IU)/L (ref 0.320–4.118)

## 2014-03-04 NOTE — Progress Notes (Signed)
Cavour, MD [place] [address]  DIAGNOSIS: Tonsil cancer - Plan: CBC with Differential, Comprehensive metabolic panel (Cmet) - [facility], Lactate dehydrogenase (LDH) - [facility], CANCELED: CBC with Differential, CANCELED: Comprehensive metabolic panel (Cmet) - [facility], CANCELED: Lactate dehydrogenase (LDH) - [facility]  Renal insufficiency - Plan: CANCELED: CBC with Differential, CANCELED: Comprehensive metabolic panel (Cmet) - [facility], CANCELED: Lactate dehydrogenase (LDH) - [facility]  Anemia, unspecified - Plan: CANCELED: CBC with Differential, CANCELED: Comprehensive metabolic panel (Cmet) - [facility], CANCELED: Lactate dehydrogenase (LDH) - [facility]  Chief Complaint  Patient presents with  . Tonsil cancer   PAST THERAPY:  Concurrent chemo with q3 weeks Cisplatin and daily radiation between 11/13/2012 and 12/29/2012. His 3rd and last dose of chemo was canceled due to poor toleration.    CURRENT THERAPY:  Watchful observation.   INTERVAL HISTORY: Omar Sawyer 73 y.o. male returns for regular follow up with his wife Cathleen.   He was last seen by me on 11/05/2013.  His appetite is slowly recovering. He performs compression for his neck edema which he reports has improved over time.    He is doing biotin five times a day.   He denied tinnitus, but he has some hearing loss.  He denied palpable node in the neck.  His PEG tube was removed on 05/04/2013.  The rest of the 14-point review of system was negative.   MEDICAL HISTORY: Past Medical History  Diagnosis Date  . Essential hypertension, benign   . Mixed hyperlipidemia   . Unspecified sinusitis (chronic)   . Edema   . Personal history of adenomatous colonic polyps 07/23/2012    2010  . Hearing loss   . Hx of prostatitis   . 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 Anxiety     mild new dx  . GERD (gastroesophageal reflux disease)   . Neuromuscular disorder     b/l tremors,   . Basal cell carcinoma   . Mucositis 12/04/2012  . Renal insufficiency 12/16/2012  . Status post chemotherapy     concurrent chemo with q3 weeks Cisplatin and daily radiation between 11/13/2012 and 12/29/2012  . S/P radiation therapy 11/13/2012-12/29/2012      Right tonsil/ bilateral neck/ total dose 70 Gy in 35 fractions  . Thrush, oral 05/15/2013  . Renal insufficiency 12/16/2012    INTERIM HISTORY: has HTN (hypertension); Hyperlipidemia; Personal history of adenomatous colonic polyps; Tonsil cancer; Renal insufficiency; and Anemia, unspecified on his problem list.    ALLERGIES:  is allergic to hydrocodone.  MEDICATIONS: has a current medication list which includes the following prescription(s): aspirin, denta 5000 plus, ensure plus, metoprolol succinate, omeprazole, sildenafil, and tamsulosin.  SURGICAL HISTORY:  Past Surgical History  Procedure Laterality Date  . Colonoscopy  2011    polyps. Dr. Carlean Purl.  Due now 2013  . Biopsy of right tonsil Right 10/03/2012    Squamous Cell Carcinoma     . Blepharoplasty      REVIEW OF SYSTEMS:   Constitutional: Denies fevers, chills or abnormal weight loss Eyes: Denies blurriness of vision Ears, nose, mouth, throat, and face: Denies mucositis or sore throat Respiratory: Denies cough, dyspnea or wheezes Cardiovascular: Denies palpitation, chest discomfort or lower extremity swelling Gastrointestinal:  Denies nausea,  heartburn or change in bowel habits Skin: Denies abnormal skin rashes Lymphatics: Denies new lymphadenopathy or easy bruising Neurological:Denies numbness, tingling or new weaknesses Behavioral/Psych: Mood is stable, no new changes  All other systems were reviewed with the patient and are negative.  PHYSICAL EXAMINATION: ECOG PERFORMANCE STATUS: 0 - Asymptomatic  Blood pressure  139/59, pulse 57, temperature 97.8 F (36.6 C), temperature source Oral, resp. rate 18, height 5\' 9"  (1.753 m), weight 174 lb 6.4 oz (79.107 kg), SpO2 100.00%.  GENERAL:alert, no distress and comfortable SKIN: skin color, texture, turgor are normal, no rashes or significant lesions; redness skin over left side of throat EYES: normal, Conjunctiva are pink and non-injected, sclera clear OROPHARYNX:no exudate, no erythema and lips, buccal mucosa NECK: supple, thyroid normal size, non-tender, without nodularity; Anterior Neck demonstrates tissue plethora LYMPH:  no palpable lymphadenopathy in the cervical, axillary or inguinal LUNGS: clear to auscultation and percussion with normal breathing effort HEART: regular rate & rhythm and no murmurs and no lower extremity edema ABDOMEN:abdomen soft, non-tender and normal bowel sounds Musculoskeletal:no cyanosis of digits and no clubbing  NEURO: alert & oriented x 3 with fluent speech, no focal motor/sensory deficits  LABORATORY DATA: Results for orders placed in visit on 03/04/14 (from the past 48 hour(s))  CBC WITH DIFFERENTIAL     Status: Abnormal   Collection Time    03/04/14  8:58 AM      Result Value Ref Range   WBC 5.4  4.0 - 10.3 10e3/uL   NEUT# 4.0  1.5 - 6.5 10e3/uL   HGB 12.8 (*) 13.0 - 17.1 g/dL   HCT 38.6  38.4 - 49.9 %   Platelets 159  140 - 400 10e3/uL   MCV 93.9  79.3 - 98.0 fL   MCH 31.1  27.2 - 33.4 pg   MCHC 33.2  32.0 - 36.0 g/dL   RBC 4.11 (*) 4.20 - 5.82 10e6/uL   RDW 13.1  11.0 - 14.6 %   lymph# 0.7 (*) 0.9 - 3.3 10e3/uL   MONO# 0.6  0.1 - 0.9 10e3/uL   Eosinophils Absolute 0.1  0.0 - 0.5 10e3/uL   Basophils Absolute 0.0  0.0 - 0.1 10e3/uL   NEUT% 73.8  39.0 - 75.0 %   LYMPH% 13.5 (*) 14.0 - 49.0 %   MONO% 10.5  0.0 - 14.0 %   EOS% 2.0  0.0 - 7.0 %   BASO% 0.2  0.0 - 2.0 %  TSH [facility]     Status: None   Collection Time    03/04/14  8:58 AM      Result Value Ref Range   TSH 2.887  0.320 - 4.118 m(IU)/L   COMPREHENSIVE METABOLIC PANEL (BM84)     Status: Abnormal   Collection Time    03/04/14  8:58 AM      Result Value Ref Range   Sodium 140  136 - 145 mEq/L   Potassium 4.8  3.5 - 5.1 mEq/L   Chloride 103  98 - 109 mEq/L   CO2 30 (*) 22 - 29 mEq/L   Glucose 104  70 - 140 mg/dl   BUN 34.2 (*) 7.0 - 26.0 mg/dL   Creatinine 1.4 (*) 0.7 - 1.3 mg/dL   Total Bilirubin 0.48  0.20 - 1.20 mg/dL   Alkaline Phosphatase 85  40 - 150 U/L   AST 17  5 - 34 U/L   ALT 22  0 - 55 U/L   Total Protein 6.8  6.4 - 8.3  g/dL   Albumin 3.8  3.5 - 5.0 g/dL   Calcium 9.7  8.4 - 10.4 mg/dL   Anion Gap 7  3 - 11 mEq/L    Results for WILBURT, MESSINA (MRN [id number]) as of 03/04/2014 10:52  Ref. Range 12/31/2013 07:41  Iron Latest Range: 42-163 ug/dL 98  UIBC Latest Range: 117-376 ug/dL 160  TIBC Latest Range: 202-409 ug/dL 258  %SAT Latest Range: 20-55 % 38  Ferritin Latest Range: 22-316 ng/ml 232   RADIOGRAPHIC STUDIES: Nm Pet Image Restag (ps) Skull Base To Thigh  04/23/2013   *RADIOLOGY REPORT*  Clinical Data: Subsequent treatment strategy for restaging of tonsillar cancer.  NUCLEAR MEDICINE PET SKULL BASE TO THIGH  Fasting Blood Glucose:  92  Technique:  15.8 mCi F-18 FDG was injected intravenously. CT data was obtained and used for attenuation correction and anatomic localization only.  (This was not acquired as a diagnostic CT examination.) Additional exam technical data entered on technologist worksheet.  Comparison:  10/26/2012  Findings:  Neck: Interval resolution of the previously described right tonsillar hypermetabolic soft tissue mass.  Resolution of previously described right-sided level II and III nodal hypermetabolism.  Chest:  Mild hypermetabolism which corresponds to subpleural minimal right apical airspace disease on image 72/series 2.  This measures a S.U.V. max of 4.3.  Abdomen/Pelvis:  No abnormal hypermetabolism.  Skeleton:  Bilateral greater trochanteric hypermetabolism which is likely related to  bursitis.  CT  images performed for attenuation correction demonstrate bilateral carotid atherosclerosis.  This is greater on the right than left.  Mild cardiomegaly with trace pericardial fluid or thickening. Trace right-sided pleural fluid.  Calcified right hilar lymph nodes, consistent with old granulomatous disease.  Punctate lower pole right renal calculus. Normal adrenal glands. Gastrostomy tube.  Moderate prostatomegaly.  IMPRESSION:  1.  Interval response to therapy of right tonsillar primary and right-sided cervical nodal metastasis. 2.  No evidence of new or metastatic disease. 3.  Minimal hypermetabolism corresponding to right apical airspace disease.  Favored to be related to radiation change. 4.  Trace right-sided pleural fluid.   Original Report Authenticated By: Abigail Miyamoto, M.D.   Ir Gastrostomy Tube Removal  05/04/2013   *RADIOLOGY REPORT*  Clinical Data: *RADIOLOGY REPORT*  Clinical Data:  Tonsillar carcinoma, percutaneous gastric tube placed 10/2012, patient no longer needs g-tube, he is tolerating oral.  PERCUTANEOUS GASTROSTOMY TUBE REMOVAL  Gastrostomy tube removal was discussed with the patient and questions were answered. Risk of infection and bleeding were also discussed.  Written as well as verbal consent was obtained.  Medications: Viscous lidocaine applied in percutaneous gastric site.  The gastrostomy tube and surrounding skin were draped and prepped in sterile fashion.  The gastrostomy tube was removed in its entirety by applying manual traction.  Pressure was held until hemostasis was obtained.  The patient tolerated the procedure well with no immediate complications.  IMPRESSION: Successful 42F pull through gastrostomy tube removal.  Read By: Tsosie Billing PA-C   Original Report Authenticated By: Aletta Edouard, M.D.    ASSESSMENT: Tonsil cancer s/p concurrent chemo with q3 weeks cisplatin and daily radiation between 11/13/2012 and 12/29/2012 now w/o evidence of disease.    PLAN:   1. Tonsil Cancer. --He continues to do well. Follow-up recommendation per NCCN Guidelines: Year 1 H & P exam every 1-3 months; TSH every 6-12 months; speech/hearing and swallowing evaluation and rehabilitation as clinically indicated and dental evaluation. Continue supportive care.  Albumin is normal which is reassuring for his nutritional status.  TSH is within normal limits.  We will repeat in 6 months.  2. Anemia NOS. -- Hemoglobin is 12.8. His iron studies were negative.   3. Chronic kidney disease. --Creatinine clearance is 47 mL/min.  Counseled to avoid nephrotoxins.    4. Follow-up. -- Continue f/u per radiation oncology and ENT.  Follow up with medical oncology in 3 months.   All questions were answered. The patient knows to call the clinic with any problems, questions or concerns. We can certainly see the patient much sooner if necessary.  I spent 10 minutes counseling the patient face to face. The total time spent in the appointment was 15 minutes.    Shauntel Prest, MD 03/04/2014 10:51 AM

## 2014-03-04 NOTE — Telephone Encounter (Signed)
gv adn printed appt sched and avs for pt for NOV °

## 2014-04-15 ENCOUNTER — Ambulatory Visit (INDEPENDENT_AMBULATORY_CARE_PROVIDER_SITE_OTHER): Payer: Medicare Other | Admitting: Cardiovascular Disease

## 2014-04-15 ENCOUNTER — Encounter: Payer: Self-pay | Admitting: Cardiovascular Disease

## 2014-04-15 VITALS — BP 146/70 | HR 63 | Ht 69.0 in | Wt 176.9 lb

## 2014-04-15 DIAGNOSIS — I10 Essential (primary) hypertension: Secondary | ICD-10-CM

## 2014-04-15 MED ORDER — CARVEDILOL 12.5 MG PO TABS: 12.5000 mg | ORAL_TABLET | Freq: Two times a day (BID) | ORAL | Status: DC

## 2014-04-15 NOTE — Progress Notes (Signed)
Bettey Mare Date of Birth  September 27, 1940       [facility]    [employer] [address], [address], [place], [place]  [address]   [place], [place]  [address] [phone]     [phone]   Fax  [phone]    Fax [phone]  Problem List: 1. Hypertension 2. Hyperlipidemia  History of Present Illness:  Gjon is a 73 year old gentleman with a long history of hypertension and hyperlipidemia. He's the son of a previous patient of mine. [name].  He presents today for continued management of his hypertension.  He avoids salt.  He does eat out quite a bit.  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    He denies any chest pain or dyspnea with exertion.    September 28, 2012: He was started on HCTZ and his BP has been better.  He is having some prostate issues.  He denies any CP, syncope or presyncope.  04/15/2014:  He has been treated for tonsilar cancer ( radiation and chemotherapy)  He's not having any chest pain. He denies any dyspnea. He denies syncope.  Current Outpatient Prescriptions on File Prior to Visit  Medication Sig Dispense Refill  . aspirin 81 MG tablet Take 81 mg by mouth daily.      . DENTA 5000 PLUS 1.1 % CREA dental cream APPLY THIN RIBBON TO TOOTHBRUSH*BRUSH FOR 2 MINS* SPIT OUT EXCESS*DO NOT SWALLOW OR RINSE. USE NIGHT  51 g  0  . ENSURE PLUS (ENSURE PLUS) LIQD Take 237 mLs by mouth. 6 cans daily      . metoprolol succinate (TOPROL-XL) 50 MG 24 hr tablet Take 75 mg by mouth daily before breakfast.       . omeprazole (PRILOSEC) 20 MG capsule Take 20 mg by mouth daily.       . sildenafil (VIAGRA) 100 MG tablet Take 1 tablet by mouth as needed.      . tamsulosin (FLOMAX) 0.4 MG CAPS Take 0.4 mg by mouth daily.       No current facility-administered medications on file prior to visit.    Allergies  Allergen Reactions  . Hydrocodone Nausea And Vomiting    Past Medical  History  Diagnosis Date  . Essential hypertension, benign   . Mixed hyperlipidemia   . Unspecified sinusitis (chronic)   . Edema   . Personal history of adenomatous colonic polyps 07/23/2012    2010  . Hearing loss   . Hx of prostatitis   . 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 Anxiety     mild new dx  . GERD (gastroesophageal reflux disease)   . Neuromuscular disorder     b/l tremors,   . Basal cell carcinoma   . Mucositis 12/04/2012  . Renal insufficiency 12/16/2012  . Status post chemotherapy     concurrent chemo with q3 weeks Cisplatin and daily radiation between 11/13/2012 and 12/29/2012  . S/P radiation therapy 11/13/2012-12/29/2012      Right tonsil/ bilateral neck/ total dose 70 Gy in 35 fractions  . Thrush, oral 05/15/2013  . Renal insufficiency 12/16/2012    Past Surgical History  Procedure Laterality Date  . Colonoscopy  2011    polyps. Dr. Carlean Purl.  Due now 2013  . Biopsy of right tonsil Right 10/03/2012    Squamous Cell Carcinoma     . Blepharoplasty      History  Smoking status  . Never Smoker   Smokeless tobacco  . Never Used    History  Alcohol Use No    Family History  Problem Relation Age of Onset  . Heart disease Mother   . Hypertension Mother   . Diabetes Mother   . Hyperlipidemia Mother   . Hypertension Father   . Heart disease Father   . Hypertension Brother   . Heart attack    . Cancer Sister     breast cancer  . Colon cancer Neg Hx     Reviw of Systems:  Reviewed in the HPI.  All other systems are negative.  Physical Exam: Blood pressure 146/70, pulse 63, height 5\' 9"  (1.753 m), weight 176 lb 14.4 oz (80.241 kg). General: Well developed, well nourished, in no acute distress.  Head: Normocephalic, atraumatic, sclera non-icteric, mucus membranes are moist,   Neck: Supple.  Carotids are 2 + without bruits. No JVD  Lungs: Clear bilaterally to auscultation.  Heart: regular rate.  normal  S1 S2. No murmurs, gallops or rubs.  Abdomen: Soft, non-tender, non-distended with normal bowel sounds. No hepatomegaly. No rebound/guarding. No masses.  Msk:  Strength and tone are normal  Extremities: No clubbing or cyanosis. No edema.  Distal pedal pulses are 2+ and equal bilaterally.  Neuro: Alert and oriented X 3. Moves all extremities spontaneously.  Psych:  Responds to questions appropriately with a normal affect.  ECG: 04/15/2014: Normal sinus rhythm at 63. EKG is normal Assessment / Plan:

## 2014-04-15 NOTE — Assessment & Plan Note (Signed)
His blood pressures mildly elevated today. We'll change his metoprolol to carvedilol which may actually improve his blood pressure control. He already has about 3 months supply of the Toprol also he will continue that medication for now. We'll change him to carvedilol 12.5 mg twice a day. I will see him again in 6 months for followup visit. He'll continue to check his blood pressure on a regular basis.

## 2014-04-15 NOTE — Patient Instructions (Signed)
Your physician has recommended you make the following change in your medication: 1. Stop Metoprolol  2. Start Coreg 12.5 MG Twice a day  Your physician wants you to follow-up in: 6 months with Dr Vilinda Boehringer will receive a reminder letter in the mail two months in advance. If you don't receive a letter, please call our office to schedule the follow-up appointment.

## 2014-06-04 ENCOUNTER — Ambulatory Visit: Payer: Medicare Other | Admitting: Radiation Oncology

## 2014-06-07 ENCOUNTER — Ambulatory Visit: Payer: Medicare Other | Admitting: Radiation Oncology

## 2014-07-03 ENCOUNTER — Encounter: Payer: Self-pay | Admitting: Radiation Oncology

## 2014-07-03 ENCOUNTER — Ambulatory Visit
Admission: RE | Admit: 2014-07-03 | Discharge: 2014-07-03 | Disposition: A | Discharge status: Home / Self Care | Payer: Medicare Other | Source: Ambulatory Visit | Attending: Radiation Oncology | Admitting: Radiation Oncology

## 2014-07-03 VITALS — BP 139/71 | HR 55 | Temp 97.8°F | Ht 69.0 in | Wt 169.2 lb

## 2014-07-03 DIAGNOSIS — C099 Malignant neoplasm of tonsil, unspecified: Secondary | ICD-10-CM

## 2014-07-03 NOTE — Progress Notes (Signed)
Omar Sawyer here today for reassessment s/p radiation therapy to the right tonsil.  He continues to eat soft foods and drinks ensure to supplement.  Difficulty with swallowing meats.  He denies any pain upon swallowing and continues to have thickened saliva which is difficult to swallow.

## 2014-07-03 NOTE — Progress Notes (Signed)
Radiation Oncology         [phone] ________________________________  Name: JED KUTCH MRN: [id number]  Date: 07/03/2014  DOB: [birth year]  Follow-Up Visit Note  CC: Harlow Asa, MD  Harlow Asa, MD    ICD-9-CM ICD-10-CM   1. 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 He continues to eat soft foods and drinks ensure to supplement. Difficulty with swallowing meats. He denies any pain upon swallowing. 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  Compliant with dental follow-ups and fluoride toothpaste. Dry mouth - biotene.  Thick sputum in throat, stable.                         ALLERGIES:  is allergic to hydrocodone.  Meds: Current Outpatient Prescriptions  Medication Sig Dispense Refill  . aspirin 81 MG tablet Take 81 mg by mouth daily.    . DENTA 5000 PLUS 1.1 % CREA dental cream APPLY THIN RIBBON TO TOOTHBRUSH*BRUSH FOR 2 MINS* SPIT OUT EXCESS*DO NOT SWALLOW OR RINSE. USE NIGHT 51 g 0  . ENSURE PLUS (ENSURE PLUS) LIQD Take 237 mLs by mouth. 6 cans daily    . METOPROLOL TARTRATE PO Take 75 mg by mouth daily.    Marland Kitchen omeprazole (PRILOSEC) 20 MG capsule Take 20 mg by mouth daily.     . sildenafil (VIAGRA) 100 MG tablet Take 1 tablet by mouth as needed.    . tamsulosin (FLOMAX) 0.4 MG CAPS Take 0.4 mg by mouth daily.    . carvedilol (COREG) 12.5 MG tablet Take 1 tablet (12.5 mg total) by mouth 2 (two) times daily. 180 tablet 3   No current facility-administered medications for this encounter.    Physical Findings: The patient is in no acute distress. Patient is alert and oriented.  height is 5\' 9"  (1.753 m) and weight is 169 lb 3.2 oz (76.749 kg). His  temperature is 97.8 F (36.6 C). His blood pressure is 139/71 and his pulse is 55. His oxygen saturation is 100%. . Oropharyngeal mucosa is intact with no thrush or lesions. No palpable cervical or supraclavicular lymphadenopathy. Skin intact and smooth over neck.   Stable lymphedema, neck   Lab Findings: Lab Results  Component Value Date   WBC 5.4 03/04/2014   HGB 12.8* 03/04/2014   HCT 38.6 03/04/2014   MCV 93.9 03/04/2014   PLT 159 03/04/2014    Lab Results  Component Value Date   TSH 2.887 03/04/2014    Radiographic Findings: No results found.  Impression/Plan:    1) Head and Neck Cancer Status: No evidence of disease  2) Nutritional Status: No active issues. His PEG tube has been removed  3) Risk Factors: The patient has been educated about risk factors including alcohol and tobacco abuse; they understand that avoidance of alcohol and tobacco is important to prevent recurrences as well as other cancers  4) Swallowing: Good function  5) Dental: Encouraged to continue regular followup with dentistry, and dental hygiene including fluoride toothpaste  6) Energy: good; normal TSH in July. Recheck this month at next lab appt   7) Social: No active social issues to address at this time; he is interested in  being a Curator. I informed Gayleen Orem, RN, our Head and Neck Oncology Navigator  8) Other: He continues to have some lymphedema in his neck. He has pursued physical therapy. This is still not bothersome to him. Will refer back to physical therapy if /when patient desires.  9) Follow-up in Sept. He is scheduling ENT f/u in March.  Med/onc next week.The patient was encouraged to call with any issues or questions before then.  _____________________________________   Eppie Gibson, MD

## 2014-07-05 ENCOUNTER — Other Ambulatory Visit: Payer: Self-pay | Admitting: *Deleted

## 2014-07-05 ENCOUNTER — Telehealth: Payer: Self-pay | Admitting: Hematology

## 2014-07-05 ENCOUNTER — Telehealth: Payer: Self-pay | Admitting: Hematology and Oncology

## 2014-07-05 NOTE — Telephone Encounter (Signed)
Spoke w/ wife to confirm appt r/s from 07/08/14 to 07/18/14 due to Dr. leaving.

## 2014-07-05 NOTE — Telephone Encounter (Signed)
returned pt call re appt being r/s to 11/19 w/NG and him being out of town. s/w pt and per pt moved appt from 11/19 to 11/23 @ 9:30am.

## 2014-07-08 ENCOUNTER — Ambulatory Visit: Payer: Self-pay

## 2014-07-08 ENCOUNTER — Other Ambulatory Visit: Payer: Self-pay

## 2014-07-08 ENCOUNTER — Ambulatory Visit
Admission: RE | Admit: 2014-07-08 | Discharge: 2014-07-08 | Disposition: A | Discharge status: Home / Self Care | Payer: Medicare Other | Source: Ambulatory Visit | Attending: Radiation Oncology | Admitting: Radiation Oncology

## 2014-07-08 DIAGNOSIS — C099 Malignant neoplasm of tonsil, unspecified: Secondary | ICD-10-CM

## 2014-07-08 LAB — TSH CHCC: TSH: 2.872 m(IU)/L (ref 0.320–4.118)

## 2014-07-09 ENCOUNTER — Other Ambulatory Visit: Payer: Self-pay | Admitting: Radiation Oncology

## 2014-07-18 ENCOUNTER — Ambulatory Visit: Payer: Self-pay | Admitting: Hematology and Oncology

## 2014-07-18 ENCOUNTER — Other Ambulatory Visit: Payer: Self-pay

## 2014-07-19 ENCOUNTER — Other Ambulatory Visit: Payer: Self-pay | Admitting: Hematology and Oncology

## 2014-07-19 DIAGNOSIS — C099 Malignant neoplasm of tonsil, unspecified: Secondary | ICD-10-CM

## 2014-07-22 ENCOUNTER — Encounter: Payer: Self-pay | Admitting: Hematology and Oncology

## 2014-07-22 ENCOUNTER — Other Ambulatory Visit (HOSPITAL_BASED_OUTPATIENT_CLINIC_OR_DEPARTMENT_OTHER): Payer: Medicare Other

## 2014-07-22 ENCOUNTER — Telehealth: Payer: Self-pay | Admitting: Hematology and Oncology

## 2014-07-22 ENCOUNTER — Ambulatory Visit (HOSPITAL_BASED_OUTPATIENT_CLINIC_OR_DEPARTMENT_OTHER): Payer: Self-pay | Admitting: Hematology and Oncology

## 2014-07-22 VITALS — BP 141/65 | HR 55 | Temp 97.7°F | Resp 18 | Ht 69.0 in | Wt 170.7 lb

## 2014-07-22 DIAGNOSIS — C099 Malignant neoplasm of tonsil, unspecified: Secondary | ICD-10-CM

## 2014-07-22 DIAGNOSIS — Z23 Encounter for immunization: Secondary | ICD-10-CM

## 2014-07-22 DIAGNOSIS — N289 Disorder of kidney and ureter, unspecified: Secondary | ICD-10-CM

## 2014-07-22 DIAGNOSIS — Z299 Encounter for prophylactic measures, unspecified: Secondary | ICD-10-CM | POA: Insufficient documentation

## 2014-07-22 LAB — CBC WITH DIFFERENTIAL/PLATELET
BASO%: 0.2 % (ref 0.0–2.0)
Basophils Absolute: 0 10*3/uL (ref 0.0–0.1)
EOS ABS: 0.2 10*3/uL (ref 0.0–0.5)
EOS%: 2.9 % (ref 0.0–7.0)
HCT: 38.7 % (ref 38.4–49.9)
HGB: 12.7 g/dL — ABNORMAL LOW (ref 13.0–17.1)
LYMPH%: 13.6 % — AB (ref 14.0–49.0)
MCH: 31.1 pg (ref 27.2–33.4)
MCHC: 32.8 g/dL (ref 32.0–36.0)
MCV: 94.9 fL (ref 79.3–98.0)
MONO#: 0.4 10*3/uL (ref 0.1–0.9)
MONO%: 8 % (ref 0.0–14.0)
NEUT#: 3.9 10*3/uL (ref 1.5–6.5)
NEUT%: 75.3 % — ABNORMAL HIGH (ref 39.0–75.0)
PLATELETS: 161 10*3/uL (ref 140–400)
RBC: 4.08 10*6/uL — AB (ref 4.20–5.82)
RDW: 13.4 % (ref 11.0–14.6)
WBC: 5.2 10*3/uL (ref 4.0–10.3)
lymph#: 0.7 10*3/uL — ABNORMAL LOW (ref 0.9–3.3)

## 2014-07-22 LAB — COMPREHENSIVE METABOLIC PANEL (CC13)
ALBUMIN: 3.8 g/dL (ref 3.5–5.0)
ALK PHOS: 91 U/L (ref 40–150)
ALT: 16 U/L (ref 0–55)
AST: 17 U/L (ref 5–34)
Anion Gap: 8 mEq/L (ref 3–11)
BILIRUBIN TOTAL: 0.48 mg/dL (ref 0.20–1.20)
BUN: 29.6 mg/dL — ABNORMAL HIGH (ref 7.0–26.0)
CO2: 29 mEq/L (ref 22–29)
Calcium: 9.6 mg/dL (ref 8.4–10.4)
Chloride: 104 mEq/L (ref 98–109)
Creatinine: 1.4 mg/dL — ABNORMAL HIGH (ref 0.7–1.3)
GLUCOSE: 117 mg/dL (ref 70–140)
POTASSIUM: 5 meq/L (ref 3.5–5.1)
Sodium: 141 mEq/L (ref 136–145)
Total Protein: 6.5 g/dL (ref 6.4–8.3)

## 2014-07-22 MED ORDER — INFLUENZA VAC SPLIT QUAD 0.5 ML IM SUSY
0.5000 mL | PREFILLED_SYRINGE | Freq: Once | INTRAMUSCULAR | Status: AC
  Administered 2014-07-22: 0.5 mL via INTRAMUSCULAR
  Filled 2014-07-22: qty 0.5

## 2014-07-22 NOTE — Progress Notes (Signed)
[place] progress notes  Patient Care Team: Harlow Asa, MD as PCP - General (Family Medicine) Eppie Gibson, MD as Attending Physician (Radiation Oncology) Melissa Montane, MD as Attending Physician (Otolaryngology) Thayer Headings, MD as Attending Physician (Cardiology) Gatha Mayer, MD as Attending Physician (Gastroenterology) Simona Huh, MD as Consulting Physician (Dermatology)  CHIEF COMPLAINTS/PURPOSE OF VISIT:  Tonsil cancer  HISTORY OF PRESENTING ILLNESS:  Omar Sawyer 73 y.o. male was transferred to my care after his prior physician has left.  I reviewed the patient's records extensive and collaborated the history with the patient. Summary of his history is as follows: This patient was discovered with tonsil cancer, HPV positive. He was treated with concurrent chemo with q3 weeks Cisplatin and daily radiation between 11/13/2012 and 12/29/2012. His 3rd and last dose of chemo was canceled due to poor tolerance. He has persistent altered taste sensation but it is improving. He had mild persistent dry mouth. He has mild dysphagia and mild lymphedema from treatment but they were all improving. Denies new lump in the neck.  MEDICAL HISTORY:  Past Medical History  Diagnosis Date  . Essential hypertension, benign   . Mixed hyperlipidemia   . Unspecified sinusitis (chronic)   . Edema   . Personal history of adenomatous colonic polyps 07/23/2012    2010  . Hearing loss   . Hx of prostatitis   . 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 Anxiety     mild new dx  . GERD (gastroesophageal reflux disease)   . Neuromuscular disorder     b/l tremors,   . Basal cell carcinoma   . Mucositis 12/04/2012  . Renal insufficiency 12/16/2012  . Status post chemotherapy     concurrent chemo  with q3 weeks Cisplatin and daily radiation between 11/13/2012 and 12/29/2012  . S/P radiation therapy 11/13/2012-12/29/2012      Right tonsil/ bilateral neck/ total dose 70 Gy in 35 fractions  . Thrush, oral 05/15/2013  . Renal insufficiency 12/16/2012    SURGICAL HISTORY: Past Surgical History  Procedure Laterality Date  . Colonoscopy  2011    polyps. Dr. Carlean Purl.  Due now 2013  . Biopsy of right tonsil Right 10/03/2012    Squamous Cell Carcinoma     . Blepharoplasty      SOCIAL HISTORY: History   Social History  . Marital Status: Married    Spouse Name: N/A    Number of Children: 1  . Years of Education: N/A   Occupational History  .      working as a Multimedia programmer; Medical laboratory scientific officer    Social History Main Topics  . Smoking status: Never Smoker   . Smokeless tobacco: Never Used  . Alcohol Use: No  . Drug Use: No  . Sexual Activity: No   Other Topics Concern  . Not on file   Social History Narrative    FAMILY HISTORY: Family History  Problem Relation Age of Onset  . Heart disease Mother   . Hypertension Mother   . Diabetes Mother   . Hyperlipidemia Mother   . Hypertension Father   . Heart disease Father   . Hypertension Brother   . Heart attack    . Cancer Sister     breast cancer  .  Colon cancer Neg Hx     ALLERGIES:  is allergic to hydrocodone.  MEDICATIONS:  Current Outpatient Prescriptions  Medication Sig Dispense Refill  . aspirin 81 MG tablet Take 81 mg by mouth daily.    . carvedilol (COREG) 12.5 MG tablet Take 1 tablet (12.5 mg total) by mouth 2 (two) times daily. 180 tablet 3  . DENTA 5000 PLUS 1.1 % CREA dental cream APPLY THIN RIBBON TO TOOTHBRUSH*BRUSH FOR 2 MINS* SPIT OUT EXCESS*DO NOT SWALLOW OR RINSE. USE NIGHT 51 g 0  . ENSURE PLUS (ENSURE PLUS) LIQD Take 237 mLs by mouth. 6 cans daily    . omeprazole (PRILOSEC) 20 MG capsule Take 20 mg by mouth daily.     . sildenafil (VIAGRA) 100 MG tablet Take 1 tablet by mouth as needed.     . tamsulosin (FLOMAX) 0.4 MG CAPS Take 0.4 mg by mouth daily.     Current Facility-Administered Medications  Medication Dose Route Frequency Provider Last Rate Last Dose  . Influenza vac split quadrivalent PF (FLUARIX) injection 0.5 mL  0.5 mL Intramuscular Once Heath Lark, MD        REVIEW OF SYSTEMS:   Constitutional: Denies fevers, chills or abnormal night sweats Eyes: Denies blurriness of vision, double vision or watery eyes Ears, nose, mouth, throat, and face: Denies mucositis or sore throat Respiratory: Denies cough, dyspnea or wheezes Cardiovascular: Denies palpitation, chest discomfort or lower extremity swelling Gastrointestinal:  Denies nausea, heartburn or change in bowel habits Skin: Denies abnormal skin rashes Lymphatics: Denies new lymphadenopathy or easy bruising Neurological:Denies numbness, tingling or new weaknesses Behavioral/Psych: Mood is stable, no new changes  All other systems were reviewed with the patient and are negative.  PHYSICAL EXAMINATION: ECOG PERFORMANCE STATUS: 0 - Asymptomatic  Filed Vitals:   07/22/14 0938  BP: 141/65  Pulse: 55  Temp: 97.7 F (36.5 C)  Resp: 18   Filed Weights   07/22/14 0938  Weight: 170 lb 11.2 oz (77.429 kg)    GENERAL:alert, no distress and comfortable SKIN: skin color, texture, turgor are normal, no rashes or significant lesions EYES: normal, conjunctiva are pink and non-injected, sclera clear OROPHARYNX:no exudate, normal lips, buccal mucosa, and tongue  NECK:  Noticed significant lymphedema around his neck. No palpable abnormalities. LYMPH:  no palpable lymphadenopathy in the cervical, axillary or inguinal LUNGS: clear to auscultation and percussion with normal breathing effort HEART: regular rate & rhythm and no murmurs without lower extremity edema ABDOMEN:abdomen soft, non-tender and normal bowel sounds Musculoskeletal:no cyanosis of digits and no clubbing  PSYCH: alert & oriented x 3 with fluent  speech NEURO: no focal motor/sensory deficits  LABORATORY DATA:  I have reviewed the data as listed Lab Results  Component Value Date   WBC 5.2 07/22/2014   HGB 12.7* 07/22/2014   HCT 38.7 07/22/2014   MCV 94.9 07/22/2014   PLT 161 07/22/2014    Recent Labs  08/06/13 0835 11/05/13 0757 03/04/14 0858  NA 141 140 140  K 4.2 4.2 4.8  CO2 27 30* 30*  GLUCOSE 139 152* 104  BUN 28.6* 31.0* 34.2*  CREATININE 1.3 1.3 1.4*  CALCIUM 10.0 9.5 9.7  PROT 7.1 6.5 6.8  ALBUMIN 4.0 3.6 3.8  AST 17 16 17   ALT 19 21 22   ALKPHOS 91 91 85  BILITOT 0.35 0.28 0.48    ASSESSMENT & PLAN:  Tonsil cancer Clinically, he has no signs of recurrence. I recommend close ENT follow-up and with radiation oncologist. I will discharge him  from medical oncology clinic. He has no residual side effects from treatment.   Renal insufficiency He will continue close surveillance monitoring with PCP.  Preventive measure We discussed the importance of preventive care and reviewed the vaccination programs. He does not have any prior allergic reactions to influenza vaccination. He agrees to proceed with influenza vaccination today and we will administer it today at the clinic.    No orders of the defined types were placed in this encounter.    All questions were answered. The patient knows to call the clinic with any problems, questions or concerns. I spent 25 minutes counseling the patient face to face. The total time spent in the appointment was 30 minutes and more than 50% was on counseling.     [place], [place], MD 07/22/2014 9:55 AM

## 2014-07-22 NOTE — Assessment & Plan Note (Signed)
We discussed the importance of preventive care and reviewed the vaccination programs. He does not have any prior allergic reactions to influenza vaccination. He agrees to proceed with influenza vaccination today and we will administer it today at the clinic.  

## 2014-07-22 NOTE — Assessment & Plan Note (Addendum)
Clinically, he has no signs of recurrence. I recommend close ENT follow-up and with radiation oncologist. I will discharge him from medical oncology clinic. He has no residual side effects from treatment.

## 2014-07-22 NOTE — Assessment & Plan Note (Signed)
He will continue close surveillance monitoring with PCP.

## 2014-07-22 NOTE — Telephone Encounter (Signed)
gv and printed avs for pt °

## 2014-07-29 ENCOUNTER — Telehealth: Payer: Self-pay | Admitting: Cardiovascular Disease

## 2014-07-29 NOTE — Telephone Encounter (Signed)
Walk in pt Form " BP Readings Dropped Off" Michelle back 12.1 will give to Her then [place] Dec

## 2014-08-07 ENCOUNTER — Other Ambulatory Visit: Payer: Self-pay

## 2014-10-09 ENCOUNTER — Ambulatory Visit
Admission: RE | Admit: 2014-10-09 | Discharge: 2014-10-09 | Disposition: A | Discharge status: Home / Self Care | Payer: Medicare Other | Source: Ambulatory Visit | Attending: Otolaryngology | Admitting: Otolaryngology

## 2014-10-09 ENCOUNTER — Other Ambulatory Visit: Payer: Self-pay | Admitting: Otolaryngology

## 2014-10-09 DIAGNOSIS — C801 Malignant (primary) neoplasm, unspecified: Secondary | ICD-10-CM

## 2014-10-16 ENCOUNTER — Encounter: Payer: Self-pay | Admitting: Cardiovascular Disease

## 2014-10-16 ENCOUNTER — Ambulatory Visit (INDEPENDENT_AMBULATORY_CARE_PROVIDER_SITE_OTHER): Payer: Medicare Other | Admitting: Cardiovascular Disease

## 2014-10-16 VITALS — BP 150/72 | HR 57 | Ht 69.0 in | Wt 172.2 lb

## 2014-10-16 DIAGNOSIS — I10 Essential (primary) hypertension: Secondary | ICD-10-CM

## 2014-10-16 DIAGNOSIS — E785 Hyperlipidemia, unspecified: Secondary | ICD-10-CM

## 2014-10-16 NOTE — Patient Instructions (Addendum)
Your physician recommends that you continue on your current medications as directed. Please refer to the Current Medication list given to you today.  Your physician recommends that you return for lab work:  Within the next week for fasting cholesterol, liver, bmet  Your physician wants you to follow-up in: 1 year with Dr. Acie Fredrickson.  You will receive a reminder letter in the mail two months in advance. If you don't receive a letter, please call our office to schedule the follow-up appointment.

## 2014-10-16 NOTE — Progress Notes (Signed)
Cardiology Office Note   Date:  10/16/2014   ID:  Omar Sawyer, DOB [birth year], MRN [id number]  PCP:  Imelda Pillow, NP  Cardiologist:   Thayer Headings, MD   Chief Complaint  Patient presents with  . Hypertension   Problem List: 1. Hypertension 2. Hyperlipidemia 3. Tonsilar cancer ( radiation and chemotherapy)   History of Present Illness:  Omar Sawyer is a 74 year old gentleman with a long history of hypertension and hyperlipidemia. He's the son of a previous patient of mine. [name]. He presents today for continued management of his hypertension. He avoids salt. He does eat out quite a bit. 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   He denies any chest pain or dyspnea with exertion.   September 28, 2012: He was started on HCTZ and his BP has been better. He is having some prostate issues. He denies any CP, syncope or presyncope.  04/15/2014:  He has been treated for tonsilar cancer ( radiation and chemotherapy)  He's not having any chest pain. He denies any dyspnea. He denies syncope.   Feb. 17, 2016:  Omar Sawyer is a 74 y.o. male who presents for follow up of his HTN. Omar Sawyer is doing well.  BP at home has been well controlled.  He brought his blood pressure log with him today and all his blood pressure readings are in the normal range.Marland Kitchen He's not having any episodes of chest pain or shortness of breath.  He's not exercising as much as he should. He's not having any difficulty doing any of his normal activities.   Past Medical History  Diagnosis Date  . Essential hypertension, benign   . Mixed hyperlipidemia   . Unspecified sinusitis (chronic)   . Edema   . Personal history of adenomatous colonic polyps 07/23/2012    2010  . Hearing loss   . Hx of prostatitis   . 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 Anxiety     mild new dx  . GERD (gastroesophageal reflux disease)   . Neuromuscular disorder     b/l tremors,   . Basal cell carcinoma   . Mucositis 12/04/2012  . Renal insufficiency 12/16/2012  . Status post chemotherapy     concurrent chemo with q3 weeks Cisplatin and daily radiation between 11/13/2012 and 12/29/2012  . S/P radiation therapy 11/13/2012-12/29/2012      Right tonsil/ bilateral neck/ total dose 70 Gy in 35 fractions  . Thrush, oral 05/15/2013  . Renal insufficiency 12/16/2012    Past Surgical History  Procedure Laterality Date  . Colonoscopy  2011    polyps. Dr. Carlean Purl.  Due now 2013  . Biopsy of right tonsil Right 10/03/2012    Squamous Cell Carcinoma     . Blepharoplasty       Current Outpatient Prescriptions  Medication Sig Dispense Refill  . aspirin 81 MG tablet Take 81 mg by mouth daily.    . carvedilol (COREG) 12.5 MG tablet Take 1 tablet (12.5 mg total) by mouth 2 (two) times daily. 180 tablet 3  . DENTA 5000 PLUS 1.1 % CREA dental cream APPLY THIN RIBBON TO TOOTHBRUSH*BRUSH FOR 2 MINS* SPIT OUT EXCESS*DO NOT SWALLOW OR RINSE. USE NIGHT 51 g 0  .  ENSURE PLUS (ENSURE PLUS) LIQD Take 237 mLs by mouth. 6 cans daily    . omeprazole (PRILOSEC) 20 MG capsule Take 20 mg by mouth daily.     . sildenafil (VIAGRA) 100 MG tablet Take 1 tablet by mouth as needed.    . tamsulosin (FLOMAX) 0.4 MG CAPS Take 0.4 mg by mouth daily.     No current facility-administered medications for this visit.    Allergies:   Hydrocodone    Social History:  The patient  reports that he has never smoked. He has never used smokeless tobacco. He reports that he does not drink alcohol or use illicit drugs.   Family History:  The patient's family history includes Cancer in his sister; Diabetes in his mother; Heart attack in an other family member; Heart disease in his father and mother; Hyperlipidemia in his mother; Hypertension in his  brother, father, and mother. There is no history of Colon cancer.    ROS:  Please see the history of present illness.    Review of Systems: Constitutional:  denies fever, chills, diaphoresis, appetite change and fatigue.  HEENT: denies photophobia, eye pain, redness, hearing loss, ear pain, congestion, sore throat, rhinorrhea, sneezing, neck pain, neck stiffness and tinnitus.  Respiratory: denies SOB, DOE, cough, chest tightness, and wheezing.  Cardiovascular: denies chest pain, palpitations and leg swelling.  Gastrointestinal: denies nausea, vomiting, abdominal pain, diarrhea, constipation, blood in stool.  Genitourinary: denies dysuria, urgency, frequency, hematuria, flank pain and difficulty urinating.  Musculoskeletal: denies  myalgias, back pain, joint swelling, arthralgias and gait problem.   Skin: denies pallor, rash and wound.  Neurological: denies dizziness, seizures, syncope, weakness, light-headedness, numbness and headaches.   Hematological: denies adenopathy, easy bruising, personal or family bleeding history.  Psychiatric/ Behavioral: denies suicidal ideation, mood changes, confusion, nervousness, sleep disturbance and agitation.       All other systems are reviewed and negative.    PHYSICAL EXAM: VS:  BP 150/72 mmHg  Pulse 57  Ht 5\' 9"  (1.753 m)  Wt 172 lb 3.2 oz (78.109 kg)  BMI 25.42 kg/m2  SpO2 97% , BMI Body mass index is 25.42 kg/(m^2). GEN: Well nourished, well developed, in no acute distress HEENT:  Thickness, fullness on the front of his neck due to irradiation  Neck: no JVD, carotid bruits, or masses Cardiac: RRR; no murmurs, rubs, or gallops,no edema  Respiratory:  clear to auscultation bilaterally, normal work of breathing GI: soft, nontender, nondistended, + BS MS: no deformity or atrophy Skin: warm and dry, no rash Neuro:  Strength and sensation are intact Psych: normal   EKG:  EKG is not ordered today.    Recent Labs: 07/08/2014: TSH  2.872 07/22/2014: ALT 16; BUN 29.6*; Creatinine 1.4*; Hemoglobin 12.7*; Platelets 161; Potassium 5.0; Sodium 141    Lipid Panel    Component Value Date/Time   CHOL 112 06/23/2012 1037   TRIG 103.0 06/23/2012 1037   HDL 33.60* 06/23/2012 1037   CHOLHDL 3 06/23/2012 1037   VLDL 20.6 06/23/2012 1037   LDLCALC 58 06/23/2012 1037      Wt Readings from Last 3 Encounters:  10/16/14 172 lb 3.2 oz (78.109 kg)  07/22/14 170 lb 11.2 oz (77.429 kg)  04/15/14 176 lb 14.4 oz (80.241 kg)      Other studies Reviewed: Additional studies/ records that were reviewed today include: . Review of the above records demonstrates:    ASSESSMENT AND PLAN:  Problem List: 1. Hypertension- Jahir is doing well.   His blood  pressure is well-controlled. Continue same medications. I've encouraged him to exercise daily.  2. Hyperlipidemia - will check fasting labs later this week.   3. Tonsilar cancer ( radiation and chemotherapy)   Current medicines are reviewed at length with the patient today.  The patient does not have concerns regarding medicines.  The following changes have been made:  no change   Disposition:   FU with me in 1 year    Signed, Zadok Holaway, Wonda Cheng, MD  10/16/2014 11:19 AM    [facility] [address], [place], [place]  [address] Phone: [phone]; Fax: [phone]

## 2014-10-17 ENCOUNTER — Other Ambulatory Visit (INDEPENDENT_AMBULATORY_CARE_PROVIDER_SITE_OTHER): Payer: Medicare Other | Admitting: *Deleted

## 2014-10-17 DIAGNOSIS — E785 Hyperlipidemia, unspecified: Secondary | ICD-10-CM

## 2014-10-17 LAB — BASIC METABOLIC PANEL
BUN: 31 mg/dL — ABNORMAL HIGH (ref 6–23)
CHLORIDE: 102 meq/L (ref 96–112)
CO2: 32 mEq/L (ref 19–32)
Calcium: 9.5 mg/dL (ref 8.4–10.5)
Creatinine, Ser: 1.36 mg/dL (ref 0.40–1.50)
GFR: 54.44 mL/min — ABNORMAL LOW (ref >60.00)
Glucose, Bld: 103 mg/dL — ABNORMAL HIGH (ref 70–99)
POTASSIUM: 4.5 meq/L (ref 3.5–5.1)
SODIUM: 138 meq/L (ref 135–145)

## 2014-10-17 LAB — HEPATIC FUNCTION PANEL
ALBUMIN: 3.8 g/dL (ref 3.5–5.2)
ALT: 15 U/L (ref 0–53)
AST: 16 U/L (ref 0–37)
Alkaline Phosphatase: 69 U/L (ref 39–117)
BILIRUBIN DIRECT: 0.1 mg/dL (ref 0.0–0.3)
Total Bilirubin: 0.6 mg/dL (ref 0.2–1.2)
Total Protein: 6.5 g/dL (ref 6.0–8.3)

## 2014-10-17 LAB — LIPID PANEL
Cholesterol: 143 mg/dL (ref 0–200)
HDL: 28.8 mg/dL — ABNORMAL LOW (ref >39.00)
NonHDL: 114.2
TRIGLYCERIDES: 262 mg/dL — AB (ref 0.0–149.0)
Total CHOL/HDL Ratio: 5
VLDL: 52.4 mg/dL — ABNORMAL HIGH (ref 0.0–40.0)

## 2014-10-17 LAB — LDL CHOLESTEROL, DIRECT: Direct LDL: 70 mg/dL

## 2014-10-18 ENCOUNTER — Telehealth: Payer: Self-pay | Admitting: Nurse Practitioner

## 2014-10-18 DIAGNOSIS — E781 Pure hyperglyceridemia: Secondary | ICD-10-CM

## 2014-10-18 MED ORDER — FENOFIBRATE 145 MG PO TABS: 145.0000 mg | ORAL_TABLET | Freq: Every day | ORAL | Status: DC

## 2014-10-18 NOTE — Telephone Encounter (Signed)
Lab results and plan of care reviewed with patient's wife who returned my call; she verbalized understanding and agreement with plan of care.  She advised patient drinks 6 Ensure shakes daily and each shake has 21 grams of sugar.  I advised patient's wife to switch to Glucerna shakes, which have less sugar.  She verbalized understanding and agreement and has follow-up lab appointment for 5/24.

## 2014-10-18 NOTE — Telephone Encounter (Signed)
-----   Message from Thayer Headings, MD sent at 10/17/2014  4:25 PM EST ----- Cholesterol is ok Trigs are up. Please start Fenofibrate - 145 mg a day  ( it would be fine for the exact dose to be changed if another dose is approaved )

## 2015-01-21 ENCOUNTER — Other Ambulatory Visit (INDEPENDENT_AMBULATORY_CARE_PROVIDER_SITE_OTHER): Payer: Medicare Other | Admitting: *Deleted

## 2015-01-21 DIAGNOSIS — E781 Pure hyperglyceridemia: Secondary | ICD-10-CM

## 2015-01-21 LAB — HEPATIC FUNCTION PANEL
ALT: 13 U/L (ref 0–53)
AST: 16 U/L (ref 0–37)
Albumin: 4.2 g/dL (ref 3.5–5.2)
Alkaline Phosphatase: 36 U/L — ABNORMAL LOW (ref 39–117)
Bilirubin, Direct: 0.1 mg/dL (ref 0.0–0.3)
Total Bilirubin: 0.5 mg/dL (ref 0.2–1.2)
Total Protein: 6.6 g/dL (ref 6.0–8.3)

## 2015-01-21 LAB — BASIC METABOLIC PANEL
BUN: 37 mg/dL — ABNORMAL HIGH (ref 6–23)
CHLORIDE: 103 meq/L (ref 96–112)
CO2: 32 mEq/L (ref 19–32)
CREATININE: 1.69 mg/dL — AB (ref 0.40–1.50)
Calcium: 9.3 mg/dL (ref 8.4–10.5)
GFR: 42.34 mL/min — AB (ref >60.00)
GLUCOSE: 94 mg/dL (ref 70–99)
Potassium: 4.6 mEq/L (ref 3.5–5.1)
SODIUM: 138 meq/L (ref 135–145)

## 2015-01-21 LAB — LIPID PANEL
CHOL/HDL RATIO: 4
CHOLESTEROL: 148 mg/dL (ref 0–200)
HDL: 42.2 mg/dL (ref >39.00)
LDL Cholesterol: 82 mg/dL (ref 0–99)
NONHDL: 105.8
TRIGLYCERIDES: 121 mg/dL (ref 0.0–149.0)
VLDL: 24.2 mg/dL (ref 0.0–40.0)

## 2015-01-21 NOTE — Addendum Note (Signed)
Addended by: Eulis Foster on: 01/21/2015 08:26 AM   Modules accepted: Orders

## 2015-04-04 ENCOUNTER — Other Ambulatory Visit: Payer: Self-pay | Admitting: Cardiovascular Disease

## 2015-05-09 ENCOUNTER — Ambulatory Visit (HOSPITAL_BASED_OUTPATIENT_CLINIC_OR_DEPARTMENT_OTHER)
Admission: RE | Admit: 2015-05-09 | Discharge: 2015-05-09 | Disposition: A | Discharge status: Home / Self Care | Payer: Medicare Other | Source: Ambulatory Visit | Attending: Radiation Oncology | Admitting: Radiation Oncology

## 2015-05-09 ENCOUNTER — Telehealth: Payer: Self-pay | Admitting: *Deleted

## 2015-05-09 ENCOUNTER — Encounter: Payer: Self-pay | Admitting: Adult Health

## 2015-05-09 ENCOUNTER — Ambulatory Visit
Admission: RE | Admit: 2015-05-09 | Discharge: 2015-05-09 | Disposition: A | Discharge status: Home / Self Care | Payer: Medicare Other | Source: Ambulatory Visit | Attending: Radiation Oncology | Admitting: Radiation Oncology

## 2015-05-09 VITALS — BP 123/58 | HR 52 | Temp 97.7°F | Ht 69.0 in | Wt 171.6 lb

## 2015-05-09 DIAGNOSIS — Z08 Encounter for follow-up examination after completed treatment for malignant neoplasm: Secondary | ICD-10-CM | POA: Diagnosis not present

## 2015-05-09 DIAGNOSIS — C099 Malignant neoplasm of tonsil, unspecified: Secondary | ICD-10-CM

## 2015-05-09 LAB — TSH CHCC: TSH: 1.971 m(IU)/L (ref 0.320–4.118)

## 2015-05-09 NOTE — Progress Notes (Signed)
I briefly met Mr. Sheckler today during his visit with Dr. Isidore Moos.  Mr. Lamb is 2 years out from his diagnosis and is clinically without evidence of disease.  Dr. Isidore Moos spoke with the patient about having subsequent follow-up visits annually with our survivorship team and Mr. Soley agreed.  I discussed with him the role of survivorship and my role in his cancer care.  I gave him a copy of the "Life After Cancer for Every Survivor" booklet, along with my business card and encouraged him to call me with any questions or concerns.    I will see him in 1 year for surveillance.  I will check a TSH at that time as well.  He asked that I email him his appointments to [email or web address] when they have been scheduled. I look forward to participating in his care.    Mike Craze, NP [place] [phone]

## 2015-05-09 NOTE — Progress Notes (Signed)
Mr. Beckner reports that his taste buds "are coming back", but has to chew so much that he he gets tired when eating and frustrated at times.  He favors seafood at this time.  Maintaining his weight.  Pain issues, if any: NO Using a feeding tube?: NO Weight changes, if any:  Wt Readings from Last 3 Encounters:  05/09/15 171 lb 9.6 oz (77.837 kg)  10/16/14 172 lb 3.2 oz (78.109 kg)  07/22/14 170 lb 11.2 oz (77.429 kg)   Swallowing issues, if any: Has to chew food to the consistentcy of mashed potatoes t swallow Smoking or chewing tobacco?  No Using fluoride trays daily? No, but uses prescritption fluoride toothpaste Last ENT visit was on: 02/03/15 Dr. Melissa Montane. Laryngoscope exam. Other notable issues, if any: None

## 2015-05-09 NOTE — Progress Notes (Signed)
Radiation Oncology         [phone] ________________________________  Name: Omar Sawyer MRN: [id number]  Date: 05/09/2015  DOB: December 27, 1940  Follow-Up Visit Note  CC: Imelda Pillow, NP  Melissa Montane, MD    ICD-9-CM ICD-10-CM   1. 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 He reports that his taste buds "are coming back", but has to chew so much that he he gets tired when eating. He favors seafood at this time and is maintaining his weight. He reports not choking on his food and not craving meat. He hates tomato based foods now, even though he favored this before treatment. He also reports not craving ice cream or cold things anymore. He projected a healthy mental status and was not accompanied by his wife for today's appointment. She was teaching a Agricultural consultant at [employer].  Pain Issues: No Feeding Tube: No Weight Change: No Swallowing issues, if any: Has to chew food to the consistentcy of mashed potatoes to swallow Smoking or chewing tobacco?  No Using fluoride trays daily? No, but uses prescritption fluoride toothpaste Last ENT visit was on: 02/03/15 Dr. Melissa Montane. Laryngoscope exam. Other notable issues, if any: None                      ALLERGIES:  is allergic to hydrocodone.  Meds: Current Outpatient Prescriptions  Medication Sig Dispense Refill  . aspirin 81 MG tablet Take 81 mg by mouth daily.    . carvedilol (COREG) 12.5 MG tablet TAKE 1 TABLET (12.5 MG TOTAL) BY MOUTH 2 (TWO) TIMES DAILY. 180 tablet 3  . DENTA 5000 PLUS 1.1 % CREA dental cream APPLY THIN RIBBON TO TOOTHBRUSH*BRUSH FOR 2 MINS* SPIT OUT EXCESS*DO  NOT SWALLOW OR RINSE. USE NIGHT 51 g 0  . ENSURE PLUS (ENSURE PLUS) LIQD Take 237 mLs by mouth. 6 cans daily    . fenofibrate (TRICOR) 145 MG tablet Take 1 tablet (145 mg total) by mouth daily. 31 tablet 11  . omeprazole (PRILOSEC) 20 MG capsule Take 20 mg by mouth daily.     . sildenafil (VIAGRA) 100 MG tablet Take 1 tablet by mouth as needed.    . tamsulosin (FLOMAX) 0.4 MG CAPS Take 0.4 mg by mouth daily.     No current facility-administered medications for this encounter.    Physical Findings: The patient is in no acute distress. Patient is alert and oriented x3.  height is '5\' 9"'  (1.753 m) and weight is 171 lb 9.6 oz (77.837 kg). His temperature is 97.7 F (36.5 C). His blood pressure is 123/58 and his pulse is 52. Marland Kitchen   Oropharyngeal mucosa is intact with no thrush or lesions. No palpable cervical or supraclavicular lymphadenopathy. Skin intact and smooth over neck.   Stable lymphedema, neck General: Alert and oriented, in no acute distress HEENT: Head is normocephalic. Extraocular movements are intact. Oropharynx is clear. Neck: Firm swelling that extends from Level III of the right neck to the anterior neck in the prelaryngeal region consistent with lymphoedema. "This comes and goes," the patient stated. No enlarged lymph nodes in the cervical or supraclavicular regions to be  noted at this time. Heart: Regular in rate and rhythm with no murmurs, rubs, or gallops. Chest: Clear to auscultation bilaterally, with no rhonchi, wheezes, or rales. Abdomen: Soft, nontender, nondistended, with no rigidity or guarding. Lymphatics: see Neck Exam Psychiatric: Judgment and insight are intact. Affect is appropriate.   Lab Findings: Lab Results  Component Value Date   WBC 5.2 07/22/2014   HGB 12.7* 07/22/2014   HCT 38.7 07/22/2014   MCV 94.9 07/22/2014   PLT 161 07/22/2014    Lab Results  Component Value Date   TSH 2.872 07/08/2014    Radiographic Findings: No results  found.  Impression/Plan:   1) Head and Neck Cancer Status: No evidence of disease  2) Nutritional Status: No active issues. His PEG tube has been and remains removed.  3) Risk Factors: The patient has been educated about risk factors including alcohol and tobacco abuse. He understands that avoidance of alcohol and tobacco is important to prevent recurrences as well as other cancers.  4) Swallowing: modifies diet to soft foods, denies choking  5) Dental: Encouraged to continue regular followup with dentistry, and dental hygiene including fluoride toothpaste.  6) Energy: good;  Recheck TSH today  Lab Results  Component Value Date   TSH 2.872 07/08/2014     7) Social: No active social issues to address at this time; he is interested in being a Curator. Gayleen Orem, RN, our Head and Neck Oncology Navigator educated the patient and provided information to take home to encourage this involvement. [facility] attendance planned next week.  8)Omar Sawyer is a 74 year old male presenting to clinic in regards to his cancer of right tonsil. 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 He was instructed to use his compression collar to manage his symptoms of lymphoedema, so it will not worsen or cause future discomfort and complication. He understands that this thyroid function is to be checked today.   The patient met with Liliane Channel, RN today and was informed of the benefits, purpose, and time frame of the head and neck cancer patient support group. He was also visited by a survivor specialist Gretchen, NP at today's radiation oncology visit and informed of additional resources. He was encouraged to attend and reach out to [place], [place] or Dr. Isidore Moos  with any other additional questions and concerns in regards to his recovery.   He is aware of his appointment with Dr. Janace Hoard to take place next month of October 2016. He is managing his symptoms in an appropriate and successful manner. [facility] [address] of [place], [place] is the pharmacy that the patient uses regularly.  He is at low risk for recurrence > 2 yrs post treatment. He will followup with our survivorship clinic in 1 year with a TSH at that time. I will be available for any issues that arise in the interim.    This document serves as a record of services personally performed by Eppie Gibson, MD. It was created on her behalf by Lenn Cal, a trained medical scribe. The creation of this record is based on the scribe's personal observations and the provider's statements to them. This document has been checked and approved by the attending provider.  ____________________________________   Eppie Gibson, MD

## 2015-05-09 NOTE — Telephone Encounter (Signed)
Called left voice message lab report results thyroid function test is normal, per Dr. Isidore Moos, call for any questions 1:23 PM

## 2015-05-12 ENCOUNTER — Encounter: Payer: Self-pay | Admitting: Adult Health

## 2015-05-12 ENCOUNTER — Other Ambulatory Visit: Payer: Self-pay | Admitting: Adult Health

## 2015-05-12 DIAGNOSIS — C099 Malignant neoplasm of tonsil, unspecified: Secondary | ICD-10-CM

## 2015-06-13 ENCOUNTER — Encounter: Payer: Self-pay | Admitting: *Deleted

## 2015-06-13 NOTE — Progress Notes (Signed)
Patient and his wife attended the Fall 2016 5-week [facility] program (Tuesday evening sessions, 6:00-7:15 pm, [facility], beginning 05/13/15).  Gayleen Orem, RN, BSN, [facility] [place] [phone]

## 2015-09-19 ENCOUNTER — Other Ambulatory Visit: Payer: Self-pay

## 2015-09-19 MED ORDER — FENOFIBRATE 145 MG PO TABS: 145.0000 mg | ORAL_TABLET | Freq: Every day | ORAL | Status: DC

## 2015-09-22 ENCOUNTER — Other Ambulatory Visit: Payer: Self-pay | Admitting: Cardiovascular Disease

## 2015-09-22 MED ORDER — FENOFIBRATE 145 MG PO TABS: 145.0000 mg | ORAL_TABLET | Freq: Every day | ORAL | Status: DC

## 2015-10-16 ENCOUNTER — Encounter: Payer: Self-pay | Admitting: Cardiovascular Disease

## 2015-10-16 ENCOUNTER — Ambulatory Visit (INDEPENDENT_AMBULATORY_CARE_PROVIDER_SITE_OTHER): Payer: Medicare Other | Admitting: Cardiovascular Disease

## 2015-10-16 VITALS — BP 130/70 | HR 53 | Ht 69.0 in | Wt 171.8 lb

## 2015-10-16 DIAGNOSIS — I10 Essential (primary) hypertension: Secondary | ICD-10-CM | POA: Diagnosis not present

## 2015-10-16 DIAGNOSIS — E785 Hyperlipidemia, unspecified: Secondary | ICD-10-CM

## 2015-10-16 DIAGNOSIS — R0989 Other specified symptoms and signs involving the circulatory and respiratory systems: Secondary | ICD-10-CM

## 2015-10-16 NOTE — Progress Notes (Signed)
Cardiology Office Note   Date:  10/16/2015   ID:  Omar Sawyer, DOB [birth year], MRN [id number]  PCP:  Imelda Pillow, NP  Cardiologist:   Thayer Headings, MD   Chief Complaint  Patient presents with  . Follow-up   Problem List: 1. Hypertension 2. Hyperlipidemia 3. Tonsilar cancer ( radiation and chemotherapy)   History of Present Illness:  Omar Sawyer is a 75 year old gentleman with a long history of hypertension and hyperlipidemia. He's the son of a previous patient of mine. [name]. He presents today for continued management of his hypertension. He avoids salt. He does eat out quite a bit. 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   He denies any chest pain or dyspnea with exertion.   September 28, 2012: He was started on HCTZ and his BP has been better. He is having some prostate issues. He denies any CP, syncope or presyncope.  04/15/2014:  He has been treated for tonsilar cancer ( radiation and chemotherapy)  He's not having any chest pain. He denies any dyspnea. He denies syncope.   Feb. 17, 2016:  Omar Sawyer is a 75 y.o. male who presents for follow up of his HTN. Omar Sawyer is doing well.  BP at home has been well controlled.  He brought his blood pressure log with him today and all his blood pressure readings are in the normal range.Marland Kitchen He's not having any episodes of chest pain or shortness of breath.  He's not exercising as much as he should. He's not having any difficulty doing any of his normal activities.  Feb. 16, 2017:  Doing great.  Has some left sided pain ( occurred after pressure washing his house.     Past Medical History  Diagnosis Date  . Essential hypertension, benign   . Mixed hyperlipidemia   . Unspecified sinusitis (chronic)   . Edema   . Personal history of adenomatous colonic polyps 07/23/2012    2010  . Hearing loss   . Hx of prostatitis   . Metastasis to lymph nodes ([place]) Pet  Scan 10/26/12    Right Level IIa abd Level III Lymph Nodes  . Malignant neoplasm of tonsil ([place]) 10/03/12 bx    Tonsil =positive for p16(HR HPV MARKER)    sQUAMOUS CELL CARCINOMA  . Anxiety     mild new dx  . GERD (gastroesophageal reflux disease)   . Neuromuscular disorder ([facility])     b/l tremors,   . Basal cell carcinoma   . Mucositis 12/04/2012  . Renal insufficiency 12/16/2012  . Status post chemotherapy     concurrent chemo with q3 weeks Cisplatin and daily radiation between 11/13/2012 and 12/29/2012  . S/P radiation therapy 11/13/2012-12/29/2012      Right tonsil/ bilateral neck/ total dose 70 Gy in 35 fractions  . Thrush, oral 05/15/2013  . Renal insufficiency 12/16/2012    Past Surgical History  Procedure Laterality Date  . Colonoscopy  2011    polyps. Dr. Carlean Purl.  Due now 2013  . Biopsy of right tonsil Right 10/03/2012    Squamous Cell Carcinoma     . Blepharoplasty       Current Outpatient Prescriptions  Medication Sig Dispense Refill  . aspirin 81 MG tablet Take 81 mg by mouth daily.    . carvedilol (COREG) 12.5 MG tablet TAKE 1 TABLET (12.5 MG TOTAL) BY MOUTH 2 (TWO) TIMES DAILY. 180 tablet 3  . DENTA 5000 PLUS  1.1 % CREA dental cream APPLY THIN RIBBON TO TOOTHBRUSH*BRUSH FOR 2 MINS* SPIT OUT EXCESS*DO NOT SWALLOW OR RINSE. USE NIGHT 51 g 0  . ENSURE PLUS (ENSURE PLUS) LIQD Take 237 mLs by mouth. 6 cans daily    . fenofibrate (TRICOR) 145 MG tablet Take 1 tablet (145 mg total) by mouth daily. 31 tablet 1  . omeprazole (PRILOSEC) 20 MG capsule Take 20 mg by mouth daily.     . sildenafil (VIAGRA) 100 MG tablet Take 1 tablet by mouth as needed for erectile dysfunction.     . tamsulosin (FLOMAX) 0.4 MG CAPS Take 0.4 mg by mouth daily.     No current facility-administered medications for this visit.    Allergies:   Hydrocodone    Social History:  The patient  reports that he has never smoked. He has never used smokeless tobacco. He reports that he does not drink alcohol or use  illicit drugs.   Family History:  The patient's family history includes Cancer in his sister; Diabetes in his mother; Heart disease in his father and mother; Hyperlipidemia in his mother; Hypertension in his brother, father, and mother. There is no history of Colon cancer.    ROS:  Please see the history of present illness.    Review of Systems: Constitutional:  denies fever, chills, diaphoresis, appetite change and fatigue.  HEENT: denies photophobia, eye pain, redness, hearing loss, ear pain, congestion, sore throat, rhinorrhea, sneezing, neck pain, neck stiffness and tinnitus.  Respiratory: denies SOB, DOE, cough, chest tightness, and wheezing.  Cardiovascular: denies chest pain, palpitations and leg swelling.  Gastrointestinal: denies nausea, vomiting, abdominal pain, diarrhea, constipation, blood in stool.  Genitourinary: denies dysuria, urgency, frequency, hematuria, flank pain and difficulty urinating.  Musculoskeletal: denies  myalgias, back pain, joint swelling, arthralgias and gait problem.   Skin: denies pallor, rash and wound.  Neurological: denies dizziness, seizures, syncope, weakness, light-headedness, numbness and headaches.   Hematological: denies adenopathy, easy bruising, personal or family bleeding history.  Psychiatric/ Behavioral: denies suicidal ideation, mood changes, confusion, nervousness, sleep disturbance and agitation.       All other systems are reviewed and negative.    PHYSICAL EXAM: VS:  BP 130/70 mmHg  Pulse 53  Ht 5\' 9"  (1.753 m)  Wt 171 lb 12.8 oz (77.928 kg)  BMI 25.36 kg/m2 , BMI Body mass index is 25.36 kg/(m^2). GEN: Well nourished, well developed, in no acute distress HEENT:  Thickness, fullness on the front of his neck due to irradiation   Neck: no JVD, soft carotid bruit - R>L  or masses Cardiac: RRR; no murmurs, rubs, or gallops,no edema  Respiratory:  clear to auscultation bilaterally, normal work of breathing GI: soft, nontender,  nondistended, + BS MS: no deformity or atrophy Skin: warm and dry, no rash Neuro:  Strength and sensation are intact Psych: normal   EKG:  EKG is ordered today.Feb. 16, 2017:   Sinus brady at 53.   Marland Kitchen    Recent Labs: 01/21/2015: ALT 13; BUN 37*; Creatinine, Ser 1.69*; Potassium 4.6; Sodium 138 05/09/2015: TSH 1.971    Lipid Panel    Component Value Date/Time   CHOL 148 01/21/2015 0828   TRIG 121.0 01/21/2015 0828   HDL 42.20 01/21/2015 0828   CHOLHDL 4 01/21/2015 0828   VLDL 24.2 01/21/2015 0828   LDLCALC 82 01/21/2015 0828   LDLDIRECT 70.0 10/17/2014 0838      Wt Readings from Last 3 Encounters:  10/16/15 171 lb 12.8 oz (77.928 kg)  05/09/15 171 lb 9.6 oz (77.837 kg)  10/16/14 172 lb 3.2 oz (78.109 kg)      Other studies Reviewed: Additional studies/ records that were reviewed today include: . Review of the above records demonstrates:    ASSESSMENT AND PLAN:  Problem List: 1. Hypertension- Omar Sawyer is doing well.   His blood pressure is well-controlled. Continue same medications. I've encouraged him to exercise daily.  2. Hyperlipidemia - will check fasting labs later this week.   3. Tonsilar cancer ( radiation and chemotherapy)  4.  Carotid bruit - will get a carotid duplex scan    Current medicines are reviewed at length with the patient today.  The patient does not have concerns regarding medicines.  The following changes have been made:  no change   Disposition:   FU with me in 1 year    Signed, Nahser, Wonda Cheng, MD  10/16/2015 10:50 AM    [facility] [place], [place], [place]  [address] Phone: [phone]; Fax: [phone]

## 2015-10-16 NOTE — Patient Instructions (Signed)
Medication Instructions:  Your physician recommends that you continue on your current medications as directed. Please refer to the Current Medication list given to you today.   Labwork: TODAY - complete metabolic panel, cholesterol   Testing/Procedures: Your physician has requested that you have a carotid duplex. This test is an ultrasound of the carotid arteries in your neck. It looks at blood flow through these arteries that supply the brain with blood. Allow one hour for this exam. There are no restrictions or special instructions.   Follow-Up: Your physician wants you to follow-up in: 1 year with Dr. Acie Fredrickson.  You will receive a reminder letter in the mail two months in advance. If you don't receive a letter, please call our office to schedule the follow-up appointment.   If you need a refill on your cardiac medications before your next appointment, please call your pharmacy.   Thank you for choosing [facility]! Christen Bame, RN [phone]

## 2015-10-17 ENCOUNTER — Ambulatory Visit: Payer: Medicare Other | Admitting: Cardiovascular Disease

## 2015-10-17 LAB — COMPREHENSIVE METABOLIC PANEL
ALT: 11 U/L (ref 9–46)
AST: 19 U/L (ref 10–35)
Albumin: 4.2 g/dL (ref 3.6–5.1)
Alkaline Phosphatase: 44 U/L (ref 40–115)
BUN: 35 mg/dL — AB (ref 7–25)
CALCIUM: 9.6 mg/dL (ref 8.6–10.3)
CHLORIDE: 103 mmol/L (ref 98–110)
CO2: 27 mmol/L (ref 20–31)
Creat: 1.69 mg/dL — ABNORMAL HIGH (ref 0.70–1.18)
GLUCOSE: 124 mg/dL — AB (ref 65–99)
POTASSIUM: 4.7 mmol/L (ref 3.5–5.3)
Sodium: 139 mmol/L (ref 135–146)
TOTAL PROTEIN: 6.8 g/dL (ref 6.1–8.1)
Total Bilirubin: 0.4 mg/dL (ref 0.2–1.2)

## 2015-10-20 ENCOUNTER — Ambulatory Visit (HOSPITAL_COMMUNITY)
Admission: RE | Admit: 2015-10-20 | Discharge: 2015-10-20 | Disposition: A | Discharge status: Home / Self Care | Payer: Medicare Other | Source: Ambulatory Visit | Attending: Cardiology | Admitting: Cardiology

## 2015-10-20 DIAGNOSIS — I6523 Occlusion and stenosis of bilateral carotid arteries: Secondary | ICD-10-CM | POA: Insufficient documentation

## 2015-10-20 DIAGNOSIS — I10 Essential (primary) hypertension: Secondary | ICD-10-CM | POA: Insufficient documentation

## 2015-10-20 DIAGNOSIS — R0989 Other specified symptoms and signs involving the circulatory and respiratory systems: Secondary | ICD-10-CM | POA: Diagnosis not present

## 2015-10-20 DIAGNOSIS — E782 Mixed hyperlipidemia: Secondary | ICD-10-CM | POA: Insufficient documentation

## 2015-10-23 ENCOUNTER — Telehealth: Payer: Self-pay | Admitting: Cardiovascular Disease

## 2015-10-23 NOTE — Telephone Encounter (Signed)
Returning a call,thinks it is regarding his test results.

## 2015-10-23 NOTE — Telephone Encounter (Signed)
Reviewed results of carotid duplex with patient's wife who verbalized understanding

## 2015-11-24 ENCOUNTER — Other Ambulatory Visit: Payer: Self-pay | Admitting: Cardiovascular Disease

## 2016-03-24 ENCOUNTER — Other Ambulatory Visit: Payer: Self-pay

## 2016-03-24 MED ORDER — CARVEDILOL 12.5 MG PO TABS: ORAL_TABLET | ORAL | 2 refills | Status: DC

## 2016-03-24 NOTE — Telephone Encounter (Signed)
Margreta Journey, MD at 10/16/2015 10:50 AM    carvedilol (COREG) 12.5 MG tablet TAKE 1 TABLET (12.5 MG TOTAL) BY MOUTH 2 (TWO) TIMES DAILY   Current medicines are reviewed at length with the patient today.  The patient does not have concerns regarding medicines.  The following changes have been made:  no change  Patient Instructions   Medication Instructions:  Your physician recommends that you continue on your current medications as directed. Please refer to the Current Medication list given to you today

## 2016-05-06 ENCOUNTER — Other Ambulatory Visit (HOSPITAL_BASED_OUTPATIENT_CLINIC_OR_DEPARTMENT_OTHER): Payer: Medicare Other

## 2016-05-06 ENCOUNTER — Telehealth: Payer: Self-pay | Admitting: Adult Health

## 2016-05-06 ENCOUNTER — Ambulatory Visit (HOSPITAL_BASED_OUTPATIENT_CLINIC_OR_DEPARTMENT_OTHER): Payer: Medicare Other | Admitting: Adult Health

## 2016-05-06 ENCOUNTER — Ambulatory Visit: Payer: Self-pay | Admitting: Adult Health

## 2016-05-06 VITALS — BP 149/59 | HR 54 | Temp 97.5°F | Resp 18 | Ht 69.0 in | Wt 168.0 lb

## 2016-05-06 DIAGNOSIS — C099 Malignant neoplasm of tonsil, unspecified: Secondary | ICD-10-CM | POA: Diagnosis not present

## 2016-05-06 DIAGNOSIS — R634 Abnormal weight loss: Secondary | ICD-10-CM

## 2016-05-06 LAB — TSH: TSH: 2.11 m(IU)/L (ref 0.320–4.118)

## 2016-05-06 NOTE — Telephone Encounter (Signed)
appt made and avs printed °

## 2016-05-07 LAB — T4, FREE: T4,Free(Direct): 1.07 ng/dL (ref 0.82–1.77)

## 2016-05-07 NOTE — Progress Notes (Signed)
CLINIC:  Survivorship  REASON FOR VISIT:  Routine follow-up for head & neck cancer.   BRIEF ONCOLOGIC HISTORY:  (from Omar Sawyer visit on 05/09/15)  -Concurrent chemo with Cisplatin every 3 weeks x 2 cycles; 3rd cycle held due to poor tolerance; last dose of chemo was 12/29/12.  INTERVAL HISTORY:  Omar Sawyer reports feeling quite well since his last visit to the cancer center.  His appetite is down a little, but his weight has been stable at home. He still drinks Ensure daily. He has no problems swallowing; denies pain.  His energy levels are good.  He continues to have some xerostomia, but this is stable and manageable.  He tells me that he saw Dr. Janace Sawyer earlier today as well and was told that "everything looks good."  He has some hearing loss and wears hearing aids.  He has some back pain/arthritis; otherwise he is largely without complaints and reports feeling great.     ADDITIONAL REVIEW OF SYSTEMS:  Review of Systems  Constitutional: Negative.   HENT: Positive for hearing loss.   Eyes: Negative.   Respiratory: Negative.   Cardiovascular: Negative.   Gastrointestinal: Negative.   Genitourinary: Negative.   Musculoskeletal: Positive for back pain.  Skin: Negative.   Neurological: Negative.   Endo/Heme/Allergies: Negative.   Psychiatric/Behavioral: Negative.      CURRENT MEDICATIONS:  Current Outpatient Prescriptions on File Prior to Visit  Medication Sig Dispense Refill  . aspirin 81 MG tablet Take 81 mg by mouth daily.    . carvedilol (COREG) 12.5 MG tablet TAKE 1 TABLET (12.5 MG TOTAL) BY MOUTH 2 (TWO) TIMES DAILY. 180 tablet 2  . DENTA 5000 PLUS 1.1 % CREA dental cream APPLY THIN RIBBON TO TOOTHBRUSH*BRUSH FOR 2 MINS* SPIT OUT EXCESS*DO NOT SWALLOW OR RINSE. USE NIGHT 51 g 0  . ENSURE PLUS (ENSURE PLUS) LIQD Take 237 mLs by mouth. 6 cans daily    . fenofibrate (TRICOR) 145 MG tablet Take 1 tablet (145 mg total) by mouth daily. 30 tablet 10  . omeprazole (PRILOSEC) 20 MG  capsule Take 20 mg by mouth daily.     . sildenafil (VIAGRA) 100 MG tablet Take 1 tablet by mouth as needed for erectile dysfunction.     . tamsulosin (FLOMAX) 0.4 MG CAPS Take 0.4 mg by mouth daily.     No current facility-administered medications on file prior to visit.     ALLERGIES:  Allergies  Allergen Reactions  . Hydrocodone Nausea And Vomiting     PHYSICAL EXAM:  Vitals:   05/06/16 1450  BP: (!) 149/59  Pulse: (!) 54  Resp: 18  Temp: 97.5 F (36.4 C)   Filed Weights   05/06/16 1450  Weight: 168 lb (76.2 kg)   Weight Date  168 lb (76.2 kg) 05/06/16  171 lb 9.6 oz (77.837 kg). 05/09/15  170 lb 11.2 oz (77.429 kg) 07/22/14  175 lb 1.6 oz (79.425 kg). 02/01/14  174 lb 14.4 oz (79.334 kg). 05/15/13  180 lb 12.8 oz (82.01 kg).  01/26/13  195 lb 14.4 oz (88.86 kg).  Pre-treatment: 10/27/12   General: Male in no acute distress.   HEENT: Head is atraumatic and normocephalic.  Pupils equal and reactive to light. Conjunctivae clear without exudate.  Sclerae anicteric. Oral mucosa is pink and moist without lesions.  Tongue pink, moist, and midline. Mild telangectasia to posterior oropharynx consistent with radiation changes.  Lymph: No cervical, supraclavicular, or infraclavicular lymphadenopathy noted on palpation.  Neck: No palpable masses. Skin on neck is intact. Mild anterior neck fibrosis; mild submental lymphedema.  Cardiovascular: Normal rate and rhythm. Respiratory: Clear to auscultation bilaterally. Chest expansion symmetric without accessory muscle use on inspiration or expiration.   GI: Abdomen soft and round. No tenderness to palpation. Bowel sounds normoactive in 4 quadrants. No hepatosplenomegaly.  GU: Deferred.   Neuro: No focal deficits. Steady gait.   Psych: Normal mood and affect for situation. Extremities: No edema.  Skin: Warm and dry.    LABORATORY DATA:  Results for [name], [name] (MRN [id number]) as of 05/06/2016  Ref. Range 05/06/2016 14:40  TSH Latest  Ref Range: 0.320 - 4.118 m(IU)/L 2.110  T4,Free(Direct) Latest Ref Range: 0.82 - 1.77 ng/dL 1.07    DIAGNOSTIC IMAGING:  None for this visit.    ASSESSMENT & PLAN:  Mr.  Sawyer is a pleasant 75 y.o. male with history of Stage IVA (T2N2bM0) squamous cell carcinoma of the right tonsil, HPV (+), treated with concurrent chemoradiation with Cisplatin every 3 weeks x 2 cycles (3rd cycle held due to poor tolerance); last dose of chemo was 12/29/12; last radiation 12/29/12.  Patient presents to survivorship clinic today for routine follow-up and continued surveillance.    1. Cancer of the right tonsil:  Omar Sawyer is clinically without evidence of disease on physical exam today.  He will return to the cancer center in 1 year for continued surveillance.  He will continue to follow-up with her ENT physician, Dr. Janace Sawyer, at regular intervals as well.  Encouraged patient to call me with any questions or concerns in the interim.    2. At risk for hypothyroidism: His TSH is normal today at 2.110. He understands that we will continue to monitor his TSH over the next few years as patients who are treated for head & neck cancer are at increased risk of developing hypothyroidism.      3. Dental/oral care: He sees his dentist regularly. Encouraged him to continue to see his dentist 3-4 times per year and to also use fluoride trays or fluoride toothpaste.   4. Neck lymphedema: He has mild lymphedema in exam today, but it is not concerning for him.  We will continue to monitor. If it worsens, we can consider referral to PT for lymphedema management.   5. Tobacco/Alcohol: He continues to abstain from tobacco. He understands the importance of continuing to remain tobacco-free and limiting alcohol consumption.    Dispo:  -Return to cancer center in 1 year to see Survivorship NP; will recheck TSH at that time.     A total of 20 minutes was spent in the face-to-face care of this patient, with greater than 50% of that  time spent in counseling and care-coordination.   Omar Craze, NP [place] [phone]

## 2016-05-14 ENCOUNTER — Encounter: Payer: Self-pay | Admitting: Adult Health

## 2016-08-19 ENCOUNTER — Encounter: Payer: Self-pay | Admitting: Nurse Practitioner

## 2016-08-19 ENCOUNTER — Telehealth: Payer: Self-pay | Admitting: *Deleted

## 2016-08-19 NOTE — Telephone Encounter (Signed)
please call pt about fenofibrate, its too expensive per pt, wants to know if there is something he can take that is cheaper, thanks

## 2016-08-19 NOTE — Telephone Encounter (Signed)
Left message for patient to call back  

## 2016-08-19 NOTE — Telephone Encounter (Signed)
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  He states it will be too expensive for him to continue to take.  I asked him if they specified which dosage is tier 3 because in the past we have had other dosages covered by insurance.  He states he will call to ask that question and call me back.

## 2016-08-24 ENCOUNTER — Telehealth: Payer: Self-pay | Admitting: Cardiovascular Disease

## 2016-08-24 MED ORDER — CARVEDILOL 12.5 MG PO TABS: ORAL_TABLET | ORAL | 3 refills | Status: DC

## 2016-08-24 MED ORDER — FENOFIBRATE 160 MG PO TABS: 160.0000 mg | ORAL_TABLET | Freq: Every day | ORAL | 3 refills | Status: DC

## 2016-08-24 NOTE — Telephone Encounter (Signed)
Omar Sawyer is calling to follow up with you on a refill on Carvedilol 12.5mg  , Fenofibrate 145 mg . He has questions  Please call

## 2016-08-24 NOTE — Telephone Encounter (Signed)
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  I advised him I am sending these prescriptions to [facility] mail order and to call back with questions.  He verbalized understanding and thanked me for the call.

## 2016-08-27 ENCOUNTER — Other Ambulatory Visit: Payer: Self-pay | Admitting: Nurse Practitioner

## 2016-10-01 ENCOUNTER — Other Ambulatory Visit: Payer: Self-pay | Admitting: Cardiovascular Disease

## 2016-10-21 ENCOUNTER — Other Ambulatory Visit: Payer: Self-pay | Admitting: Cardiovascular Disease

## 2016-10-21 DIAGNOSIS — I6523 Occlusion and stenosis of bilateral carotid arteries: Secondary | ICD-10-CM

## 2016-10-25 ENCOUNTER — Ambulatory Visit (HOSPITAL_COMMUNITY)
Admission: RE | Admit: 2016-10-25 | Discharge: 2016-10-25 | Disposition: A | Discharge status: Home / Self Care | Payer: Medicare HMO | Source: Ambulatory Visit | Attending: Cardiology | Admitting: Cardiology

## 2016-10-25 ENCOUNTER — Encounter: Payer: Self-pay | Admitting: Cardiovascular Disease

## 2016-10-25 ENCOUNTER — Ambulatory Visit (INDEPENDENT_AMBULATORY_CARE_PROVIDER_SITE_OTHER): Payer: Medicare HMO | Admitting: Cardiovascular Disease

## 2016-10-25 VITALS — BP 120/60 | HR 54 | Ht 69.0 in | Wt 170.0 lb

## 2016-10-25 DIAGNOSIS — E782 Mixed hyperlipidemia: Secondary | ICD-10-CM

## 2016-10-25 DIAGNOSIS — R0989 Other specified symptoms and signs involving the circulatory and respiratory systems: Secondary | ICD-10-CM | POA: Diagnosis not present

## 2016-10-25 DIAGNOSIS — I6523 Occlusion and stenosis of bilateral carotid arteries: Secondary | ICD-10-CM

## 2016-10-25 DIAGNOSIS — I10 Essential (primary) hypertension: Secondary | ICD-10-CM

## 2016-10-25 MED ORDER — CARVEDILOL 6.25 MG PO TABS: 6.2500 mg | ORAL_TABLET | Freq: Two times a day (BID) | ORAL | 3 refills | Status: DC

## 2016-10-25 NOTE — Progress Notes (Signed)
Cardiology Office Note   Date:  10/25/2016   ID:  Omar Sawyer, DOB 1940/10/28, MRN [id number]  PCP:  Imelda Pillow, NP  Cardiologist:   Mertie Moores, MD   Chief Complaint  Patient presents with  . Follow-up    HTN, hyperlipidemia    Problem List: 1. Hypertension 2. Hyperlipidemia 3. Tonsilar cancer ( radiation and chemotherapy)   History of Present Illness:  Omar Sawyer is a 76 year old gentleman with a long history of hypertension and hyperlipidemia. He's the son of a previous patient of mine. ( Omar Sawyer). He presents today for continued management of his hypertension. He avoids salt. He does eat out quite a bit. 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   He denies any chest pain or dyspnea with exertion.   September 28, 2012: He was started on HCTZ and his BP has been better. He is having some prostate issues. He denies any CP, syncope or presyncope.  04/15/2014:  He has been treated for tonsilar cancer ( radiation and chemotherapy)  He's not having any chest pain. He denies any dyspnea. He denies syncope.   Feb. 17, 2016:  Omar Sawyer is a 76 y.o. male who presents for follow up of his HTN. Thunder is doing well.  BP at home has been well controlled.  He brought his blood pressure log with him today and all his blood pressure readings are in the normal range.Omar Sawyer He's not having any episodes of chest pain or shortness of breath.  He's not exercising as much as he should. He's not having any difficulty doing any of his normal activities.  Feb. 16, 2017:  Doing great.  Has some left sided pain ( occurred after pressure washing his house.   Feb. 26, 2018:  Doing well from a cardiac standpoint HR has remained slow ( he is on Coreg 12. 5 BID )    Past Medical History:  Diagnosis Date  . Anxiety    mild new dx  . Basal cell carcinoma   . Edema   . Essential hypertension, benign   . GERD (gastroesophageal  reflux disease)   . Hearing loss   . Hx of prostatitis   . 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 Metastasis to lymph nodes ([place]) Pet Scan 10/26/12   Right Level IIa abd Level III Lymph Nodes  . Mixed hyperlipidemia   . Mucositis 12/04/2012  . Neuromuscular disorder (HCC)    b/l tremors,   . Personal history of adenomatous colonic polyps 07/23/2012   2010  . Renal insufficiency 12/16/2012  . Renal insufficiency 12/16/2012  . S/P radiation therapy 11/13/2012-12/29/2012     Right tonsil/ bilateral neck/ total dose 70 Gy in 35 fractions  . Status post chemotherapy    concurrent chemo with q3 weeks Cisplatin and daily radiation between 11/13/2012 and 12/29/2012  . Thrush, oral 05/15/2013  . Unspecified sinusitis (chronic)     Past Surgical History:  Procedure Laterality Date  . Biopsy of Right Tonsil Right 10/03/2012   Squamous Cell Carcinoma     . BLEPHAROPLASTY    . COLONOSCOPY  2011   polyps. Dr. Carlean Purl.  Due now 2013     Current Outpatient Prescriptions  Medication Sig Dispense Refill  . aspirin 81 MG tablet Take 81 mg by mouth daily.    . carvedilol (COREG) 12.5 MG tablet  TAKE 1 TABLET (12.5 MG TOTAL) BY MOUTH 2 (TWO) TIMES DAILY. 180 tablet 3  . DENTA 5000 PLUS 1.1 % CREA dental cream APPLY THIN RIBBON TO TOOTHBRUSH*BRUSH FOR 2 MINS* SPIT OUT EXCESS*DO NOT SWALLOW OR RINSE. USE NIGHT 51 g 0  . ENSURE PLUS (ENSURE PLUS) LIQD Take 237 mLs by mouth. 6 cans daily    . fenofibrate 160 MG tablet Take 1 tablet (160 mg total) by mouth daily. 90 tablet 3  . omeprazole (PRILOSEC) 20 MG capsule Take 20 mg by mouth daily.     . sildenafil (VIAGRA) 100 MG tablet Take 1 tablet by mouth as needed for erectile dysfunction.     . tamsulosin (FLOMAX) 0.4 MG CAPS Take 0.4 mg by mouth daily.     No current facility-administered medications for this visit.     Allergies:   Hydrocodone    Social History:  The patient   reports that he has never smoked. He has never used smokeless tobacco. He reports that he does not drink alcohol or use drugs.   Family History:  The patient's family history includes Cancer in his sister; Diabetes in his mother; Heart disease in his father and mother; Hyperlipidemia in his mother; Hypertension in his brother, father, and mother.    ROS:  Please see the history of present illness.    Review of Systems: Constitutional:  denies fever, chills, diaphoresis, appetite change and fatigue.  HEENT: denies photophobia, eye pain, redness, hearing loss, ear pain, congestion, sore throat, rhinorrhea, sneezing, neck pain, neck stiffness and tinnitus.  Respiratory: denies SOB, DOE, cough, chest tightness, and wheezing.  Cardiovascular: denies chest pain, palpitations and leg swelling.  Gastrointestinal: denies nausea, vomiting, abdominal pain, diarrhea, constipation, blood in stool.  Genitourinary: denies dysuria, urgency, frequency, hematuria, flank pain and difficulty urinating.  Musculoskeletal: denies  myalgias, back pain, joint swelling, arthralgias and gait problem.   Skin: denies pallor, rash and wound.  Neurological: denies dizziness, seizures, syncope, weakness, light-headedness, numbness and headaches.   Hematological: denies adenopathy, easy bruising, personal or family bleeding history.  Psychiatric/ Behavioral: denies suicidal ideation, mood changes, confusion, nervousness, sleep disturbance and agitation.       All other systems are reviewed and negative.    PHYSICAL EXAM: VS:  BP 120/60 (BP Location: Right Arm, Patient Position: Sitting, Cuff Size: Normal)   Pulse (!) 54   Ht 5\' 9"  (1.753 m)   Wt 170 lb (77.1 kg)   SpO2 97%   BMI 25.10 kg/m  , BMI Body mass index is 25.1 kg/m. GEN: Well nourished, well developed, in no acute distress  HEENT:  Thickness, fullness on the front of his neck due to irradiation   Neck: no JVD, soft carotid bruit - R>L  or  masses Cardiac: RRR; no murmurs, rubs, or gallops,no edema  Respiratory:  clear to auscultation bilaterally, normal work of breathing GI: soft, nontender, nondistended, + BS MS: no deformity or atrophy  Skin: warm and dry, no rash Neuro:  Strength and sensation are intact Psych: normal   EKG:  EKG is ordered today.Feb. 26, 2018:  Sinus brady at 54.      Recent Labs: 05/06/2016: TSH 2.110    Lipid Panel    Component Value Date/Time   CHOL 148 01/21/2015 0828   TRIG 121.0 01/21/2015 0828   HDL 42.20 01/21/2015 0828   CHOLHDL 4 01/21/2015 0828   VLDL 24.2 01/21/2015 0828   LDLCALC 82 01/21/2015 0828   LDLDIRECT 70.0 10/17/2014 [id number]  Wt Readings from Last 3 Encounters:  10/25/16 170 lb (77.1 kg)  05/06/16 168 lb (76.2 kg)  10/16/15 171 lb 12.8 oz (77.9 kg)      Other studies Reviewed: Additional studies/ records that were reviewed today include: . Review of the above records demonstrates:    ASSESSMENT AND PLAN:  Problem List: 1. Hypertension- Siddhan is doing well.   His blood pressure is well-controlled. Continue same medications. I've encouraged him to exercise daily.  2. Hyperlipidemia -  Will check  In 1 year   3. Tonsilar cancer ( radiation and chemotherapy)  4.  Carotid bruit - will get a carotid duplex scan to follow up with his mild carotid disease    Current medicines are reviewed at length with the patient today.  The patient does not have concerns regarding medicines.  The following changes have been made:  no change   Disposition:   FU with me in 1 year    Signed, Mertie Moores, MD  10/25/2016 9:41 AM    [facility] [address], [place], [place]  [address] Phone: [phone]; Fax: [phone]

## 2016-10-25 NOTE — Patient Instructions (Addendum)
Medication Instructions:  DECREASE Coreg (Carvedilol) to 6.25 mg twice daily    Labwork: Your physician recommends that you return for lab work in: 1 year on the day of or a few days before your office visit with Dr. Acie Fredrickson.  You will need to FAST for this appointment - nothing to eat or drink after midnight the night before except water.    Testing/Procedures: None Ordered   Follow-Up: Your physician wants you to follow-up in: 1 year with Dr. Acie Fredrickson.  You will receive a reminder letter in the mail two months in advance. If you don't receive a letter, please call our office to schedule the follow-up appointment.   If you need a refill on your cardiac medications before your next appointment, please call your pharmacy.   Thank you for choosing [facility]! Christen Bame, RN [phone]

## 2016-10-28 ENCOUNTER — Other Ambulatory Visit: Payer: Medicare HMO | Admitting: *Deleted

## 2016-10-28 DIAGNOSIS — E782 Mixed hyperlipidemia: Secondary | ICD-10-CM

## 2016-10-28 DIAGNOSIS — I10 Essential (primary) hypertension: Secondary | ICD-10-CM

## 2016-10-28 LAB — COMPREHENSIVE METABOLIC PANEL
A/G RATIO: 2.1 (ref 1.2–2.2)
ALK PHOS: 43 IU/L (ref 39–117)
ALT: 12 IU/L (ref 0–44)
AST: 22 IU/L (ref 0–40)
Albumin: 4.4 g/dL (ref 3.5–4.8)
BILIRUBIN TOTAL: 0.4 mg/dL (ref 0.0–1.2)
BUN/Creatinine Ratio: 20 (ref 10–24)
BUN: 34 mg/dL — ABNORMAL HIGH (ref 8–27)
CHLORIDE: 99 mmol/L (ref 96–106)
CO2: 24 mmol/L (ref 18–29)
Calcium: 9.6 mg/dL (ref 8.6–10.2)
Creatinine, Ser: 1.67 mg/dL — ABNORMAL HIGH (ref 0.76–1.27)
GFR calc Af Amer: 45 mL/min/{1.73_m2} — ABNORMAL LOW (ref >59)
GFR calc non Af Amer: 39 mL/min/{1.73_m2} — ABNORMAL LOW (ref >59)
GLOBULIN, TOTAL: 2.1 g/dL (ref 1.5–4.5)
Glucose: 119 mg/dL — ABNORMAL HIGH (ref 65–99)
POTASSIUM: 4.5 mmol/L (ref 3.5–5.2)
SODIUM: 140 mmol/L (ref 134–144)
Total Protein: 6.5 g/dL (ref 6.0–8.5)

## 2016-10-28 LAB — LIPID PANEL
CHOL/HDL RATIO: 3.6 ratio (ref 0.0–5.0)
CHOLESTEROL TOTAL: 152 mg/dL (ref 100–199)
HDL: 42 mg/dL (ref >39)
LDL CALC: 87 mg/dL (ref 0–99)
TRIGLYCERIDES: 117 mg/dL (ref 0–149)
VLDL CHOLESTEROL CAL: 23 mg/dL (ref 5–40)

## 2016-10-29 ENCOUNTER — Telehealth: Payer: Self-pay | Admitting: Nurse Practitioner

## 2016-10-29 NOTE — Telephone Encounter (Signed)
Reviewed lab results with patient's wife who states patient is right beside her. She verbalized results to him and I answered questions to their satisfaction. She thanked me for the call.

## 2017-04-28 NOTE — Progress Notes (Signed)
Mr. Forget presents for follow up of radiation completed 12/29/2012 to his Right Tonsil/ Bilateral Neck.   Pain issues, if any: He reports pain to his Right Hip Using a feeding tube?: N/A Removed 2014 Weight changes, if any:  Wt Readings from Last 3 Encounters:  05/06/17 166 lb 6.4 oz (75.5 kg)  10/25/16 170 lb (77.1 kg)  05/06/16 168 lb (76.2 kg)   Swallowing issues, if any: He reports continued taste changes. He needs to swallow large amounts of water to swallow food. He will develop a cough during eating and usually needs to stop eating. He is unable to eat breads. He has a dry throat. He is drinking 4-6 Ensure daily.  Smoking or chewing tobacco? No Using fluoride trays daily? He is using fluoride toothpaste.  Last ENT visit was on: Dr. Janace Hoard 12/09/16 Other notable issues, if any:   BP 122/70   Pulse 65   Temp 99.5 F (37.5 C)   Ht 5\' 9"  (1.753 m)   Wt 166 lb 6.4 oz (75.5 kg)   SpO2 98% Comment: room air  BMI 24.57 kg/m

## 2017-05-06 ENCOUNTER — Encounter: Payer: Self-pay | Admitting: Radiation Oncology

## 2017-05-06 ENCOUNTER — Encounter: Payer: Self-pay | Admitting: Adult Health

## 2017-05-06 ENCOUNTER — Ambulatory Visit
Admission: RE | Admit: 2017-05-06 | Discharge: 2017-05-06 | Disposition: A | Discharge status: Home / Self Care | Payer: Medicare HMO | Source: Ambulatory Visit | Attending: Radiation Oncology | Admitting: Radiation Oncology

## 2017-05-06 ENCOUNTER — Other Ambulatory Visit (HOSPITAL_BASED_OUTPATIENT_CLINIC_OR_DEPARTMENT_OTHER): Payer: Medicare HMO

## 2017-05-06 DIAGNOSIS — M25551 Pain in right hip: Secondary | ICD-10-CM | POA: Diagnosis not present

## 2017-05-06 DIAGNOSIS — Z7982 Long term (current) use of aspirin: Secondary | ICD-10-CM | POA: Insufficient documentation

## 2017-05-06 DIAGNOSIS — Z79899 Other long term (current) drug therapy: Secondary | ICD-10-CM | POA: Diagnosis not present

## 2017-05-06 DIAGNOSIS — C099 Malignant neoplasm of tonsil, unspecified: Secondary | ICD-10-CM | POA: Insufficient documentation

## 2017-05-06 DIAGNOSIS — R634 Abnormal weight loss: Secondary | ICD-10-CM | POA: Diagnosis not present

## 2017-05-06 LAB — TSH: TSH: 1.76 m(IU)/L (ref 0.320–4.118)

## 2017-05-06 NOTE — Progress Notes (Signed)
Radiation Oncology         ([phone]) [phone] ________________________________  Name: Omar Sawyer MRN: [id number]  Date: 05/06/2017  DOB: 06/10/1941  Follow-Up Visit Note  CC: Omar Beals, NP  Melissa Montane, MD    ICD-10-CM   1. 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    Pain issues, if any: He reports pain to the right hip  Using a feeding tube?: N/A Removed in 2014  Weight changes, if any:  Wt Readings from Last 3 Encounters:  05/06/17 166 lb 6.4 oz (75.5 kg)  10/25/16 170 lb (77.1 kg)  05/06/16 168 lb (76.2 kg)   Swallowing issues, if any: His swallowing difficulty is stable and has been going on for several months to years. He reports continued taste changes. He needs to swallow large amounts of water to swallow food. He will develop a cough during eating and usually needs to stop eating. He is unable to eat breads. He has a dry throat. He is drinking 4-6 Ensure daily.   Smoking or chewing tobacco? No   Last ENT visit was on: 12/09/2016 with Dr. Janace Hoard  Other notable issues, if any: He feels a little bit under the weather today and took Motrin. Overall, however, he is doing well.                  ALLERGIES:  is allergic to hydrocodone.  Meds: Current Outpatient Prescriptions  Medication Sig Dispense Refill  . aspirin 81 MG tablet Take 81 mg by mouth daily.    . carvedilol (COREG) 6.25 MG tablet Take 1 tablet (6.25 mg total) by mouth 2 (two) times daily. 180 tablet 3  . DENTA 5000 PLUS 1.1 % CREA dental cream APPLY THIN RIBBON TO TOOTHBRUSH*BRUSH FOR 2 MINS* SPIT OUT EXCESS*DO NOT SWALLOW OR RINSE. USE NIGHT 51 g 0  . ENSURE PLUS  (ENSURE PLUS) LIQD Take 237 mLs by mouth. 6 cans daily    . fenofibrate 160 MG tablet Take 1 tablet (160 mg total) by mouth daily. 90 tablet 3  . omeprazole (PRILOSEC) 20 MG capsule Take 20 mg by mouth daily.     . tamsulosin (FLOMAX) 0.4 MG CAPS Take 0.4 mg by mouth daily.     No current facility-administered medications for this encounter.     Physical Findings:  height is 5\' 9"  (1.753 m) and weight is 166 lb 6.4 oz (75.5 kg). His temperature is 99.5 F (37.5 C). His blood pressure is 122/70 and his pulse is 65. His oxygen saturation is 98%. .   General: Alert and oriented, in no acute distress. HEENT: Oropharynx is clear. Neck: Lymphedema in the anterior neck, favoring the right side.  Heart: Regular in rate and rhythm with no murmurs, rubs, or gallops. Chest: Clear to auscultation bilaterally, with no rhonchi, wheezes, or rales. Abdomen: Soft, nontender, nondistended, with no rigidity or guarding. Extremities: No cyanosis or edema. Lymphatics: see Neck Exam Skin: The skin over his neck is smooth and intact. No concerning lesions. Psychiatric: Judgment and insight are intact. Affect is appropriate.    Lab Findings: Lab Results  Component Value Date   WBC 5.2 07/22/2014   HGB  12.7 (L) 07/22/2014   HCT 38.7 07/22/2014   MCV 94.9 07/22/2014   PLT 161 07/22/2014    Lab Results  Component Value Date   TSH 1.760 05/06/2017    Radiographic Findings: No results found.  Impression/Plan:    1) Head and Neck Cancer Status: No evidence of disease  2) Nutritional Status: No issues. PEG tube: N/A  3) Swallowing: He has some ongoing issues with the sensation of food getting stuck in his throat. I recommended he follow up with Dr. Janace Hoard one more time for a check-up, which would close the loop on his 5-year time period since completing treatment. At that time, he can discuss with Dr. Janace Hoard his dysphagia and whether other procedures are warranted to evaluate and treat this. He is  at risk for strictures given his history of radiation.  4) Dental: Encouraged to continue regular followup with dentistry, and dental hygiene including fluoride rinses.    5) Thyroid function:WNL.  He and his wife were advised to continue yearly TSH labs with his PCP to screen for hypothyroidism following radiotherapy. They stated understanding of this. Lab Results  Component Value Date   TSH 1.760 05/06/2017   6) I will discharge him from my service. I will see him back as needed. I look forward to seeing him at future Survivorship celebrations at the [facility]. The patient was encouraged to call with any issues or questions in the years to come.   ____________________________________   Eppie Gibson, MD  This document serves as a record of services personally performed by Eppie Gibson, MD. It was created on her behalf by Rae Lips, a trained medical scribe. The creation of this record is based on the scribe's personal observations and the provider's statements to them. This document has been checked and approved by the attending provider.

## 2017-05-09 ENCOUNTER — Telehealth: Payer: Self-pay | Admitting: *Deleted

## 2017-05-09 NOTE — Telephone Encounter (Signed)
Oncology Nurse Navigator Documentation  Per guidance from NP Mike Craze, called patient to report WNL TSH, spoke with wife as he was not available.  She expressed appreciation for call.  Gayleen Orem, RN, BSN, [place] [facility] Oncology Nurse [facility] [phone]

## 2017-05-26 ENCOUNTER — Ambulatory Visit (INDEPENDENT_AMBULATORY_CARE_PROVIDER_SITE_OTHER): Payer: Medicare HMO | Admitting: Orthopedic Surgery

## 2017-05-26 ENCOUNTER — Ambulatory Visit (INDEPENDENT_AMBULATORY_CARE_PROVIDER_SITE_OTHER): Payer: Medicare HMO

## 2017-05-26 ENCOUNTER — Encounter (INDEPENDENT_AMBULATORY_CARE_PROVIDER_SITE_OTHER): Payer: Self-pay | Admitting: Orthopedic Surgery

## 2017-05-26 DIAGNOSIS — M5416 Radiculopathy, lumbar region: Secondary | ICD-10-CM

## 2017-05-26 MED ORDER — NABUMETONE 500 MG PO TABS: ORAL_TABLET | ORAL | 0 refills | Status: DC

## 2017-05-28 NOTE — Progress Notes (Signed)
Office Visit Note   Patient: Omar Sawyer           Date of Birth: 03/11/1941           MRN: [id number] Visit Date: 05/26/2017 Requested by: Everardo Beals, NP [facility] [address] [place], [place] [address] PCP: Everardo Beals, NP  Subjective: Chief Complaint  Patient presents with  . Lower Back - Pain    HPI: Omar Sawyer is a patient with a back pain and right hip and leg pain.  Describes low back pain radiating down the right leg.  Hurts across his lower back.  Standing on for 4 weeks.  Denies any history of injury.  States he has some giving way.  He is a retired Sales promotion account executive.  He mows 3 yards.  Takes Prilosec for reflux.  Tylenol has helped him some.  He will occasionally wake up with pain.  Denies any bowel and bladder symptoms.              ROS: All systems reviewed are negative as they relate to the chief complaint within the history of present illness.  Patient denies  fevers or chills.   Assessment & Plan: Visit Diagnoses:  1. Lumbar radiculopathy     Plan: Impression is facet arthritis possible early radiculopathy without weakness plan is anti-inflammatories for 2 weeks only with a course of physical therapy 6 week return to decide for or against further imaging and injections.  I'll see him back at that time  Follow-Up Instructions: Return in about 6 weeks (around 07/07/2017).   Orders:  Orders Placed This Encounter  Procedures  . XR Lumbar Spine 2-3 Views   Meds ordered this encounter  Medications  . nabumetone (RELAFEN) 500 MG tablet    Sig: 1 po bid for 2 weeks    Dispense:  30 tablet    Refill:  0      Procedures: No procedures performed   Clinical Data: No additional findings.  Objective: Vital Signs: There were no vitals taken for this visit.  Physical Exam:   Constitutional: Patient appears well-developed HEENT:  Head: Normocephalic Eyes:EOM are normal Neck: Normal range of motion Cardiovascular: Normal  rate Pulmonary/chest: Effort normal Neurologic: Patient is alert Skin: Skin is warm Psychiatric: Patient has normal mood and affect    Ortho Exam: Orthopedic exam demonstrates full active and passive range of motion of knees and ankles and hips.  No nerve root tension signs.  Has 5 out of 5 ankle dorsiflexion plantar flexion quite hamstring strength.  No paresthesias C5-T1 L1-S1.  Some pain with forward lateral bending is noted.  Particularly in extension.  Specialty Comments:  No specialty comments available.  Imaging: No results found.   PMFS History: Patient Active Problem List   Diagnosis Date Noted  . Preventive measure 07/22/2014  . Anemia, unspecified 11/06/2013  . Renal insufficiency 12/16/2012  . Tonsil cancer ([place]) 10/27/2012  . Personal history of adenomatous colonic polyps 07/23/2012  . HTN (hypertension) 06/23/2012  . Hyperlipidemia 06/23/2012   Past Medical History:  Diagnosis Date  . Anxiety    mild new dx  . Basal cell carcinoma   . Edema   . Essential hypertension, benign   . GERD (gastroesophageal reflux disease)   . Hearing loss   . Hx of prostatitis   . 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 Metastasis to lymph nodes (  [place]) Pet Scan 10/26/12   Right Level IIa abd Level III Lymph Nodes  . Mixed hyperlipidemia   . Mucositis 12/04/2012  . Neuromuscular disorder (HCC)    b/l tremors,   . Personal history of adenomatous colonic polyps 07/23/2012   2010  . Renal insufficiency 12/16/2012  . Renal insufficiency 12/16/2012  . S/P radiation therapy 11/13/2012-12/29/2012     Right tonsil/ bilateral neck/ total dose 70 Gy in 35 fractions  . Status post chemotherapy    concurrent chemo with q3 weeks Cisplatin and daily radiation between 11/13/2012 and 12/29/2012  . Thrush, oral 05/15/2013  . Unspecified sinusitis (chronic)     Family History  Problem Relation Age of Onset  . Heart  disease Mother   . Hypertension Mother   . Diabetes Mother   . Hyperlipidemia Mother   . Hypertension Father   . Heart disease Father   . Hypertension Brother   . Heart attack Unknown   . Cancer Sister        breast cancer  . Colon cancer Neg Hx     Past Surgical History:  Procedure Laterality Date  . Biopsy of Right Tonsil Right 10/03/2012   Squamous Cell Carcinoma     . BLEPHAROPLASTY    . COLONOSCOPY  2011   polyps. Dr. Carlean Purl.  Due now 2013   Social History   Occupational History  .  [employer]    working as a Multimedia programmer; Medical laboratory scientific officer    Social History Main Topics  . Smoking status: Never Smoker  . Smokeless tobacco: Never Used  . Alcohol use No  . Drug use: No  . Sexual activity: No

## 2017-06-23 ENCOUNTER — Other Ambulatory Visit: Payer: Self-pay | Admitting: *Deleted

## 2017-06-23 DIAGNOSIS — I6523 Occlusion and stenosis of bilateral carotid arteries: Secondary | ICD-10-CM

## 2017-08-05 ENCOUNTER — Other Ambulatory Visit: Payer: Self-pay | Admitting: Cardiovascular Disease

## 2017-08-10 ENCOUNTER — Other Ambulatory Visit: Payer: Self-pay

## 2017-08-10 MED ORDER — CARVEDILOL 6.25 MG PO TABS: 6.2500 mg | ORAL_TABLET | Freq: Two times a day (BID) | ORAL | 0 refills | Status: DC

## 2017-09-20 ENCOUNTER — Ambulatory Visit
Admission: RE | Admit: 2017-09-20 | Discharge: 2017-09-20 | Disposition: A | Discharge status: Home / Self Care | Payer: Medicare HMO | Source: Ambulatory Visit | Attending: Radiation Oncology | Admitting: Radiation Oncology

## 2017-09-20 ENCOUNTER — Telehealth: Payer: Self-pay | Admitting: Adult Health

## 2017-09-20 ENCOUNTER — Encounter: Payer: Self-pay | Admitting: Radiation Oncology

## 2017-09-20 VITALS — BP 160/62 | HR 57 | Temp 98.2°F | Ht 69.0 in | Wt 165.2 lb

## 2017-09-20 DIAGNOSIS — L309 Dermatitis, unspecified: Secondary | ICD-10-CM | POA: Insufficient documentation

## 2017-09-20 DIAGNOSIS — Z923 Personal history of irradiation: Secondary | ICD-10-CM | POA: Insufficient documentation

## 2017-09-20 DIAGNOSIS — Z08 Encounter for follow-up examination after completed treatment for malignant neoplasm: Secondary | ICD-10-CM | POA: Diagnosis not present

## 2017-09-20 DIAGNOSIS — Z7982 Long term (current) use of aspirin: Secondary | ICD-10-CM | POA: Insufficient documentation

## 2017-09-20 DIAGNOSIS — Z85810 Personal history of malignant neoplasm of tongue: Secondary | ICD-10-CM | POA: Insufficient documentation

## 2017-09-20 DIAGNOSIS — Z79899 Other long term (current) drug therapy: Secondary | ICD-10-CM | POA: Diagnosis not present

## 2017-09-20 DIAGNOSIS — C090 Malignant neoplasm of tonsillar fossa: Secondary | ICD-10-CM

## 2017-09-20 NOTE — Progress Notes (Addendum)
Radiation Oncology         [phone] ________________________________  Name: Omar Sawyer MRN: [id number]  Date: 09/20/2017  DOB: 26-Feb-1941  Follow-Up Visit Note  CC: Everardo Beals, NP  Everardo Beals, NP    ICD-10-CM   1. 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 He was referred here after seeing PCP on Sunday morning when he was given prednisone injection and cream to apply himself. He applied the cream BID on left and right and left cervical / SCV neck as well as upper chest but discontinued today.  He stopped the cream because the rash spread inferiorly into the upper chest after starting the cream   The endorses cataract surgery 1.5 weeks ago. Endorsed new eyedrops but denies any other med changes from ophthalmologist aside from doxycycline BID through his PCP for the rash. He has a few more weeks on eyedrop regimen before having other eye operated on.   On review of systems, he states the rash is not warm, itchy, or burning. He denies fevers.  No trouble swallowing.  No odynophagia.  No hoarseness.  He does endorse possible right neck swelling when symptoms began, but none now. States rash began in right upper neck and migrated down to sternal area and appeared "like sunburn." Rash worsened after applying cream but has stabilized since discontinuing.  Pain issues, if any: Denies  Using a feeding tube?: N/A Removed in 2014  Weight changes, if any:  Wt Readings from Last 3 Encounters:  05/06/17 166 lb 6.4 oz (75.5 kg)  10/25/16 170 lb (77.1 kg)  05/06/16 168 lb  (76.2 kg)   Swallowing issues, if any: patient states he is swallowing well but does need to drink water with meals to help with swallowing food  Smoking or chewing tobacco? No   Last ENT visit was on: 12/09/2016 with Dr. Janace Hoard  ALLERGIES:  is allergic to hydrocodone.  Meds: Current Outpatient Medications  Medication Sig Dispense Refill  . aspirin 81 MG tablet Take 81 mg by mouth daily.    . carvedilol (COREG) 6.25 MG tablet Take 1 tablet (6.25 mg total) by mouth 2 (two) times daily. 180 tablet 0  . DENTA 5000 PLUS 1.1 % CREA dental cream APPLY THIN RIBBON TO TOOTHBRUSH*BRUSH FOR 2 MINS* SPIT OUT EXCESS*DO NOT SWALLOW OR RINSE. USE NIGHT 51 g 0  . ENSURE PLUS (ENSURE PLUS) LIQD Take 237 mLs by mouth. 6 cans daily    . fenofibrate 160 MG tablet TAKE 1 TABLET EVERY DAY 90 tablet 3  . omeprazole (PRILOSEC) 20 MG capsule Take 20 mg by mouth daily.     . tamsulosin (FLOMAX) 0.4 MG CAPS Take 0.4 mg by mouth daily.    Marland Kitchen BESIVANCE 0.6 % SUSP INSTILL 1 DROP INTO RIGHT EYE 3 TIMES A DAY  1  . doxycycline (ADOXA) 100 MG tablet doxycycline monohydrate 100 mg tablet  Take 1 tablet twice a day by oral route for 10 days.    . DUREZOL 0.05 % EMUL INSTILL 1 DROP INTO RIGHT EYE 3 TIMES A DAY  1  .  nabumetone (RELAFEN) 500 MG tablet 1 po bid for 2 weeks (Patient not taking: Reported on 09/20/2017) 30 tablet 0  . PROLENSA 0.07 % SOLN INSTILL 1 DROP INTO RIGHT EYE AT BEDTIME  1   No current facility-administered medications for this encounter.     Physical Findings:  height is 5\' 9"  (1.753 m) and weight is 165 lb 3.2 oz (74.9 kg). His temperature is 98.2 F (36.8 C). His blood pressure is 160/62 (abnormal) and his pulse is 57 (abnormal). His oxygen saturation is 100%. .   General: Alert and oriented, in no acute distress. HEENT: Oropharynx and oral cavity are clear. Neck: Lymphedema consistent with prior radiotherapy noted in his neck which is more noticeable on right, but also on anterior and left  neck. No palpable adenopathy in cervical or supraclavicular neck. Lymphatics: see Neck Exam Skin: The skin over his neck is smooth and intact.  Rash  photographed below; it is not itchy, not warm, non blanching. No nodularity. Erythematous rash that is most noticeable in the upper mediastinal region extending into the supraclavicular region bilaterally. Milder in the right cervical neck compared to SCV/ upper chest rash. Also noted in submental region and most mild in the left cervical neck. In upper mediastinal area there is a central purplish nature to the rash. Psychiatric: Judgment and insight are intact. Affect is appropriate.  Lab Findings: Lab Results  Component Value Date   WBC 5.2 07/22/2014   HGB 12.7 (L) 07/22/2014   HCT 38.7 07/22/2014   MCV 94.9 07/22/2014   PLT 161 07/22/2014    Lab Results  Component Value Date   TSH 1.760 05/06/2017    Radiographic Findings: No results found.  Impression/Plan:    1) Head and Neck Cancer Status: No evidence of disease about 5 years post treatment; I do not think this rash is consistent with cancer recurrence.  2) Rash: Recommended he continue on doxy and keep dermatology appointment. Try steroid cream on arm to see if rash mimics that on neck.  It is unclear whether the cream worsened his rash or whether his rash worsened despite the cream.  He will get in touch in about a week to see if symptoms have resolved.  I also asked for his dermatologist to call me during his consultation this week; he is scheduled to see dermatology on Friday.   It is interesting that the rash mimics his prior radiation fields though it does go down more inferiorly into the mediastinum which was not treated with radiation therapy (albeit it received some low dose scatter radiation).  Notably, the right cervical neck received more radiation than the left neck, and the right neck has more of a rash. Radiation recall is in the differential diagnosis so there is  not a clear inciting drug that caused the dermatitis to develop.  If the rash persists for a long time I might talk to his ophthalmologist about stopping his eyedrops which are the only new agents.  It is possible that the rash developed from an unknown cause but was predisposed to occur in the neck region because of some impairment in the lymphatics of the previously irradiated tissues.  Reassurance was given.  He will be in touch PRN.  I spent 25 minutes face to face with patient, over 50% of that time on counseling and coordination of care. ____________________________________   Eppie Gibson, MD  This document serves as a record of services personally performed by Eppie Gibson, MD. It was created  on his behalf by Linward Natal, a trained medical scribe. The creation of this record is based on the scribe's personal observations and the provider's statements to them. This document has been checked and approved by the attending provider.

## 2017-09-20 NOTE — Progress Notes (Signed)
Omar Sawyer presents for follow up today for symptoms related to tonsil radiation completed 12/2012.  Pain issues, if any: He denies Using a feeding tube?: N/A Weight changes, if any:  Wt Readings from Last 3 Encounters:  09/20/17 165 lb 3.2 oz (74.9 kg)  05/06/17 166 lb 6.4 oz (75.5 kg)  10/25/16 170 lb (77.1 kg)   Swallowing issues, if any: He is swallowing well. He does need to drink water with meals to help him swallow his food.  Smoking or chewing tobacco? No Using fluoride trays daily? No, he is using fluoride toothpaste.  Last ENT visit was on: N/A Other notable issues, if any:  He saw his PCP today. He developed a skin rash to his right neck and upper chest area on Sunday morning. He was reevaluated today and sent her for assessment. He received a steroid shot and cream to use on Sunday. Omar Sawyer tells me that the skin rash has gotten worse since starting the steroid. The rash does not itch or burn.   BP (!) 160/62   Pulse (!) 57   Temp 98.2 F (36.8 C)   Ht 5\' 9"  (1.753 m)   Wt 165 lb 3.2 oz (74.9 kg)   SpO2 100% Comment: room air  BMI 24.40 kg/m

## 2017-09-20 NOTE — Telephone Encounter (Signed)
Received a direct call from Nunzio Cory, wife of Jonan Seufert.  Sam is a 77 y/o man who you saw back in 04/2017 and released from care.  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  He saw both Dr. Juliann Mule and Dr. Alvy Bimler in 2015.    Patient wife is concerned because they have had several trips to PCP due to a skin rash.  The skin is blood red and it is worsening daily.  The PCP was concerned about ? Recurrence.  Not sure if you can eval and address with family, and/or if they could see symptom management?  Wife is very anxious and EXTREMELY concerned.  Patient wife call back number is [phone]  Thanks,  Mendel Ryder

## 2017-09-23 ENCOUNTER — Other Ambulatory Visit: Payer: Self-pay

## 2017-11-10 ENCOUNTER — Ambulatory Visit (HOSPITAL_COMMUNITY)
Admission: RE | Admit: 2017-11-10 | Discharge: 2017-11-10 | Disposition: A | Discharge status: Home / Self Care | Payer: Medicare HMO | Source: Ambulatory Visit | Attending: Cardiovascular Disease | Admitting: Cardiovascular Disease

## 2017-11-10 DIAGNOSIS — I6523 Occlusion and stenosis of bilateral carotid arteries: Secondary | ICD-10-CM | POA: Diagnosis not present

## 2017-11-21 ENCOUNTER — Other Ambulatory Visit: Payer: Medicare HMO | Admitting: *Deleted

## 2017-11-21 ENCOUNTER — Other Ambulatory Visit: Payer: Self-pay | Admitting: Nurse Practitioner

## 2017-11-21 DIAGNOSIS — E782 Mixed hyperlipidemia: Secondary | ICD-10-CM

## 2017-11-21 LAB — BASIC METABOLIC PANEL
BUN/Creatinine Ratio: 18 (ref 10–24)
BUN: 29 mg/dL — AB (ref 8–27)
CO2: 27 mmol/L (ref 20–29)
CREATININE: 1.57 mg/dL — AB (ref 0.76–1.27)
Calcium: 9.4 mg/dL (ref 8.6–10.2)
Chloride: 101 mmol/L (ref 96–106)
GFR calc non Af Amer: 42 mL/min/{1.73_m2} — ABNORMAL LOW (ref >59)
GFR, EST AFRICAN AMERICAN: 48 mL/min/{1.73_m2} — AB (ref >59)
GLUCOSE: 86 mg/dL (ref 65–99)
Potassium: 4 mmol/L (ref 3.5–5.2)
SODIUM: 140 mmol/L (ref 134–144)

## 2017-11-21 LAB — LIPID PANEL
CHOLESTEROL TOTAL: 138 mg/dL (ref 100–199)
Chol/HDL Ratio: 3.5 ratio (ref 0.0–5.0)
HDL: 40 mg/dL (ref >39)
LDL CALC: 74 mg/dL (ref 0–99)
Triglycerides: 121 mg/dL (ref 0–149)
VLDL Cholesterol Cal: 24 mg/dL (ref 5–40)

## 2017-11-21 LAB — HEPATIC FUNCTION PANEL
ALK PHOS: 45 IU/L (ref 39–117)
ALT: 11 IU/L (ref 0–44)
AST: 16 IU/L (ref 0–40)
Albumin: 4 g/dL (ref 3.5–4.8)
BILIRUBIN, DIRECT: 0.17 mg/dL (ref 0.00–0.40)
Bilirubin Total: 0.5 mg/dL (ref 0.0–1.2)
TOTAL PROTEIN: 5.9 g/dL — AB (ref 6.0–8.5)

## 2017-12-01 ENCOUNTER — Encounter: Payer: Self-pay | Admitting: Cardiovascular Disease

## 2017-12-01 ENCOUNTER — Ambulatory Visit: Payer: Medicare HMO | Admitting: Cardiovascular Disease

## 2017-12-01 VITALS — BP 126/58 | HR 57 | Ht 69.0 in | Wt 162.4 lb

## 2017-12-01 DIAGNOSIS — I6523 Occlusion and stenosis of bilateral carotid arteries: Secondary | ICD-10-CM

## 2017-12-01 DIAGNOSIS — E782 Mixed hyperlipidemia: Secondary | ICD-10-CM

## 2017-12-01 NOTE — Patient Instructions (Signed)

## 2017-12-01 NOTE — Progress Notes (Signed)
Cardiology Office Note   Date:  12/01/2017   ID:  Omar Sawyer, DOB 07/15/1941, MRN [id number]  PCP:  Everardo Beals, NP  Cardiologist:   Mertie Moores, MD   Chief Complaint  Patient presents with  . Hypertension   Problem List: 1. Hypertension 2. Hyperlipidemia 3. Tonsilar cancer ( radiation and chemotherapy)   History of Present Illness:  Omar Sawyer is a 77 year old gentleman with a long history of hypertension and hyperlipidemia. He's the son of a previous patient of mine. ( Omar Sawyer). He presents today for continued management of his hypertension. He avoids salt. He does eat out quite a bit. 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   He denies any chest pain or dyspnea with exertion.   September 28, 2012: He was started on HCTZ and his BP has been better. He is having some prostate issues. He denies any CP, syncope or presyncope.  04/15/2014:  He has been treated for tonsilar cancer ( radiation and chemotherapy)  He's not having any chest pain. He denies any dyspnea. He denies syncope.   Feb. 17, 2016:  Omar Sawyer is a 77 y.o. male who presents for follow up of his HTN. Omar Sawyer is doing well.  BP at home has been well controlled.  He brought his blood pressure log with him today and all his blood pressure readings are in the normal range.Marland Kitchen He's not having any episodes of chest pain or shortness of breath.  He's not exercising as much as he should. He's not having any difficulty doing any of his normal activities.  Feb. 16, 2017:  Doing great.  Has some left sided pain ( occurred after pressure washing his house.   Feb. 26, 2018:  Doing well from a cardiac standpoint HR has remained slow ( he is on Coreg 12. 5 BID )   December 01, 2017: BP readings are good at home. No CP or dyspnea.   No passing out or dyspnea.  Recent labs look great    Past Medical History:  Diagnosis Date  . Anxiety    mild new dx  .  Basal cell carcinoma   . Edema   . Essential hypertension, benign   . GERD (gastroesophageal reflux disease)   . Hearing loss   . Hx of prostatitis   . Malignant neoplasm of tonsil (Omar Sawyer) 10/03/12 bx   Tonsil =positive for p16(HR HPV MARKER)    sQUAMOUS CELL CARCINOMA  . 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 Mixed hyperlipidemia   . Mucositis 12/04/2012  . Neuromuscular disorder (HCC)    b/l tremors,   . Personal history of adenomatous colonic polyps 07/23/2012   2010  . Renal insufficiency 12/16/2012  . Renal insufficiency 12/16/2012  . S/P radiation therapy 11/13/2012-12/29/2012     Right tonsil/ bilateral neck/ total dose 70 Gy in 35 fractions  . Status post chemotherapy    concurrent chemo with q3 weeks Cisplatin and daily radiation between 11/13/2012 and 12/29/2012  . Thrush, oral 05/15/2013  . Unspecified sinusitis (chronic)     Past Surgical History:  Procedure Laterality Date  . Biopsy of Right Tonsil Right 10/03/2012   Squamous Cell Carcinoma     . BLEPHAROPLASTY    . COLONOSCOPY  2011   polyps. Dr. Carlean Purl.  Due now 2013     Current Outpatient Medications  Medication Sig Dispense Refill  .  aspirin 81 MG tablet Take 81 mg by mouth daily.    . carvedilol (COREG) 6.25 MG tablet Take 1 tablet (6.25 mg total) by mouth 2 (two) times daily. 180 tablet 0  . DENTA 5000 PLUS 1.1 % CREA dental cream APPLY THIN RIBBON TO TOOTHBRUSH*BRUSH FOR 2 MINS* SPIT OUT EXCESS*DO NOT SWALLOW OR RINSE. USE NIGHT 51 g 0  . ENSURE PLUS (ENSURE PLUS) LIQD Take 237 mLs by mouth. 6 cans daily    . fenofibrate 160 MG tablet Take 160 mg by mouth daily.    Marland Kitchen omeprazole (PRILOSEC) 20 MG capsule Take 20 mg by mouth daily.     . tamsulosin (FLOMAX) 0.4 MG CAPS Take 0.4 mg by mouth daily.     No current facility-administered medications for this visit.     Allergies:   Hydrocodone    Social History:  The patient  reports that he has never smoked. He  has never used smokeless tobacco. He reports that he does not drink alcohol or use drugs.   Family History:  The patient's family history includes Cancer in his sister; Diabetes in his mother; Heart attack in his unknown relative; Heart disease in his father and mother; Hyperlipidemia in his mother; Hypertension in his brother, father, and mother.    ROS: Noted in current history.  Otherwise review of systems is negative. \  Physical Exam: Blood pressure (!) 126/58, pulse (!) 57, height 5\' 9"  (1.753 m), weight 162 lb 6.4 oz (73.7 kg).  GEN:  Well nourished, well developed in no acute distress HEENT: neck has changes c/w radiation therapy.    NECK: No JVD; soft bilateral carotid bruits. LYMPHATICS: No lymphadenopathy CARDIAC: RRR   RESPIRATORY:  Clear to auscultation without rales, wheezing or rhonchi  ABDOMEN: Soft, non-tender, non-distended MUSCULOSKELETAL:  No edema; No deformity  SKIN: Warm and dry NEUROLOGIC:  Alert and oriented x 3    EKG:   December 01, 2017: Sinus bradycardia at 57 beats a minute.  Otherwise the EKG is normal.  No changes from previous EKG.   Recent Labs: 05/06/2017: TSH 1.760 11/21/2017: ALT 11; BUN 29; Creatinine, Ser 1.57; Potassium 4.0; Sodium 140    Lipid Panel    Component Value Date/Time   CHOL 138 11/21/2017 0837   TRIG 121 11/21/2017 0837   HDL 40 11/21/2017 0837   CHOLHDL 3.5 11/21/2017 0837   CHOLHDL 4 01/21/2015 0828   VLDL 24.2 01/21/2015 0828   LDLCALC 74 11/21/2017 0837   LDLDIRECT 70.0 10/17/2014 0838      Wt Readings from Last 3 Encounters:  12/01/17 162 lb 6.4 oz (73.7 kg)  09/20/17 165 lb 3.2 oz (74.9 kg)  05/06/17 166 lb 6.4 oz (75.5 kg)      Other studies Reviewed: Additional studies/ records that were reviewed today include: . Review of the above records demonstrates:    ASSESSMENT AND PLAN:  Problem List: 1. Hypertension-   blood pressure is well controlled.  Continue current medications.  2. Hyperlipidemia -  Will  check  In 1 year   3. Tonsilar cancer ( radiation and chemotherapy) -he is not having any further cancer issues at this point.  4.  Carotid bruit -he has mild carotid disease.  We will repeat the carotid duplex scan in 1 year.   Current medicines are reviewed at length with the patient today.  The patient does not have concerns regarding medicines.  The following changes have been made:  no change   Disposition:   FU  with me in 1 year    Signed, Mertie Moores, MD  12/01/2017 11:16 AM    [address], [place], [place]  [address] Phone: [phone]; Fax: [phone]

## 2018-01-11 ENCOUNTER — Other Ambulatory Visit: Payer: Self-pay | Admitting: *Deleted

## 2018-01-11 MED ORDER — CARVEDILOL 6.25 MG PO TABS: 6.2500 mg | ORAL_TABLET | Freq: Two times a day (BID) | ORAL | 3 refills | Status: DC

## 2018-05-23 ENCOUNTER — Other Ambulatory Visit: Payer: Self-pay | Admitting: *Deleted

## 2018-05-23 DIAGNOSIS — I6523 Occlusion and stenosis of bilateral carotid arteries: Secondary | ICD-10-CM

## 2018-06-20 ENCOUNTER — Other Ambulatory Visit: Payer: Self-pay | Admitting: Cardiovascular Disease

## 2018-07-14 ENCOUNTER — Encounter: Payer: Self-pay | Admitting: Internal Medicine

## 2018-08-15 ENCOUNTER — Ambulatory Visit (AMBULATORY_SURGERY_CENTER): Payer: Self-pay | Admitting: *Deleted

## 2018-08-15 ENCOUNTER — Encounter: Payer: Self-pay | Admitting: Internal Medicine

## 2018-08-15 VITALS — Ht 69.0 in | Wt 164.0 lb

## 2018-08-15 DIAGNOSIS — Z86010 Personal history of colonic polyps: Secondary | ICD-10-CM

## 2018-08-15 NOTE — Progress Notes (Signed)
No egg or soy allergy known to patient  No issues with past sedation with any surgeries  or procedures, no intubation problems - with eye surgery he had terrible hiccups and had to wake him to perform surgery  No diet pills per patient No home 02 use per patient  No blood thinners per patient  Pt denies issues with constipation  No A fib or A flutter  EMMI video sent to pt's e mail - pt declined

## 2018-08-29 ENCOUNTER — Encounter: Payer: Self-pay | Admitting: Internal Medicine

## 2018-08-29 ENCOUNTER — Ambulatory Visit (AMBULATORY_SURGERY_CENTER): Payer: Medicare HMO | Admitting: Internal Medicine

## 2018-08-29 VITALS — BP 145/64 | HR 56 | Temp 96.6°F | Resp 15 | Ht 69.0 in | Wt 164.0 lb

## 2018-08-29 DIAGNOSIS — Z86010 Personal history of colonic polyps: Secondary | ICD-10-CM | POA: Diagnosis not present

## 2018-08-29 DIAGNOSIS — D120 Benign neoplasm of cecum: Secondary | ICD-10-CM | POA: Diagnosis not present

## 2018-08-29 MED ORDER — SODIUM CHLORIDE 0.9 % IV SOLN: 500.0000 mL | Freq: Once | INTRAVENOUS | Status: DC

## 2018-08-29 NOTE — Progress Notes (Signed)
Called to room to assist during endoscopic procedure.  Patient ID and intended procedure confirmed with present staff. Received instructions for my participation in the procedure from the performing physician.  

## 2018-08-29 NOTE — Patient Instructions (Addendum)
I found and removed one tiny polyp. I will let you know pathology results.  I do not anticipate you will need any more routine colonoscopy or stool tests.  Prostate is enlarged as you know.   I also see a rash on the perianal area. If that bothers you purchase some 1% hydrocortisone and some Lotrisone or other similar antifungal cream and put those booth on that to help heal it and relieve any itching you might have. An antifungal powder after it is healed may also help.  I appreciate the opportunity to care for you. Gatha Mayer, MD, [facility]  Information on polyps and diverticulosis given to you today.  Await pathology results.  YOU HAD AN ENDOSCOPIC PROCEDURE TODAY AT [facility]:   Refer to the procedure report that was given to you for any specific questions about what was found during the examination.  If the procedure report does not answer your questions, please call your gastroenterologist to clarify.  If you requested that your care partner not be given the details of your procedure findings, then the procedure report has been included in a sealed envelope for you to review at your convenience later.  YOU SHOULD EXPECT: Some feelings of bloating in the abdomen. Passage of more gas than usual.  Walking can help get rid of the air that was put into your GI tract during the procedure and reduce the bloating. If you had a lower endoscopy (such as a colonoscopy or flexible sigmoidoscopy) you may notice spotting of blood in your stool or on the toilet paper. If you underwent a bowel prep for your procedure, you may not have a normal bowel movement for a few days.  Please Note:  You might notice some irritation and congestion in your nose or some drainage.  This is from the oxygen used during your procedure.  There is no need for concern and it should clear up in a day or so.  SYMPTOMS TO REPORT IMMEDIATELY:   Following lower endoscopy (colonoscopy or flexible  sigmoidoscopy):  Excessive amounts of blood in the stool  Significant tenderness or worsening of abdominal pains  Swelling of the abdomen that is new, acute  Fever of 100F or higher   For urgent or emergent issues, a gastroenterologist can be reached at any hour by calling [phone].   DIET:  We do recommend a small meal at first, but then you may proceed to your regular diet.  Drink plenty of fluids but you should avoid alcoholic beverages for 24 hours.  ACTIVITY:  You should plan to take it easy for the rest of today and you should NOT DRIVE or use heavy machinery until tomorrow (because of the sedation medicines used during the test).    FOLLOW UP: Our staff will call the number listed on your records the next business day following your procedure to check on you and address any questions or concerns that you may have regarding the information given to you following your procedure. If we do not reach you, we will leave a message.  However, if you are feeling well and you are not experiencing any problems, there is no need to return our call.  We will assume that you have returned to your regular daily activities without incident.  If any biopsies were taken you will be contacted by phone or by letter within the next 1-3 weeks.  Please call us at [phone] if you have not heard about the  biopsies in 3 weeks.    SIGNATURES/CONFIDENTIALITY: You and/or your care partner have signed paperwork which will be entered into your electronic medical record.  These signatures attest to the fact that that the information above on your After Visit Summary has been reviewed and is understood.  Full responsibility of the confidentiality of this discharge information lies with you and/or your care-partner.

## 2018-08-29 NOTE — Op Note (Signed)
[facility] Patient Name: Omar Sawyer Procedure Date: 08/29/2018 1:19 PM MRN: [id number] Endoscopist: Gatha Mayer , MD Age: 77 Referring MD:  Date of Birth: November 25, 1940 Gender: Male Account #: [id number] Procedure:                Colonoscopy Indications:              Surveillance: Personal history of adenomatous                            polyps on last colonoscopy 5 years ago and recent                            iFOBT + stool Medicines:                Propofol per Anesthesia, Monitored Anesthesia Care Procedure:                Pre-Anesthesia Assessment:                           - Prior to the procedure, a History and Physical                            was performed, and patient medications and                            allergies were reviewed. The patient's tolerance of                            previous anesthesia was also reviewed. The risks                            and benefits of the procedure and the sedation                            options and risks were discussed with the patient.                            All questions were answered, and informed consent                            was obtained. Prior Anticoagulants: The patient has                            taken no previous anticoagulant or antiplatelet                            agents. ASA Grade Assessment: III - A patient with                            severe systemic disease. After reviewing the risks                            and benefits, the patient was deemed in  satisfactory condition to undergo the procedure.                           After obtaining informed consent, the colonoscope                            was passed under direct vision. Throughout the                            procedure, the patient's blood pressure, pulse, and                            oxygen saturations were monitored continuously. The                            Colonoscope was  introduced through the anus and                            advanced to the the cecum, identified by                            appendiceal orifice and ileocecal valve. The                            colonoscopy was performed without difficulty. The                            patient tolerated the procedure well. The quality                            of the bowel preparation was excellent. The                            ileocecal valve, appendiceal orifice, and rectum                            were photographed. Scope In: 1:23:58 PM Scope Out: 1:39:34 PM Scope Withdrawal Time: 0 hours 12 minutes 50 seconds  Total Procedure Duration: 0 hours 15 minutes 36 seconds  Findings:                 The perianal exam findings include a perianal rash.                           The digital rectal exam findings include enlarged                            prostate.                           A diminutive polyp was found in the ileocecal                            valve. The polyp was flat. The polyp was removed  with a cold snare. Resection and retrieval were                            complete.                           Multiple diverticula were found in the sigmoid                            colon.                           The exam was otherwise without abnormality on                            direct and retroflexion views. Complications:            No immediate complications. Estimated Blood Loss:     Estimated blood loss was minimal. Impression:               - Perianal rash found on perianal exam.                           - Enlarged prostate found on digital rectal exam.                           - One diminutive polyp at the ileocecal valve,                            removed with a cold snare. Resected and retrieved.                           - Diverticulosis in the sigmoid colon.                           - The examination was otherwise normal on direct                             and retroflexion views.                           - Personal history of colonic polyps. Adenomas -                            last x 1 in 2016 Recommendation:           - Patient has a contact number available for                            emergencies. The signs and symptoms of potential                            delayed complications were discussed with the                            patient. Return to normal activities tomorrow.  Written discharge instructions were provided to the                            patient.                           - Resume previous diet.                           - Continue present medications. Topical                            hydrocortisone and antifungal creams prn perianal                            dermatitis                           - No repeat colonoscopy due to age. Gatha Mayer, MD 08/29/2018 1:53:35 PM This report has been signed electronically.

## 2018-08-29 NOTE — Progress Notes (Signed)
Pt's states no medical or surgical changes since previsit or office visit. 

## 2018-08-29 NOTE — Progress Notes (Signed)
Report to PACU, RN, vss, BBS= Clear.  

## 2018-08-31 ENCOUNTER — Telehealth: Payer: Self-pay

## 2018-08-31 NOTE — Telephone Encounter (Signed)
  Follow up Call-  Call back number 08/29/2018  Post procedure Call Back phone  # [phone]  Permission to leave phone message Yes  Some recent data might be hidden    2Patient questions:  Do you have a fever, pain , or abdominal swelling? No. Pain Score  0 *  Have you tolerated food without any problems? Yes.    Have you been able to return to your normal activities? Yes.    Do you have any questions about your discharge instructions: Diet   No. Medications  No. Follow up visit  No.  Do you have questions or concerns about your Care? No.  * If pain score is 4 or above:  No action needed, pain <4.  Spoke to pt.'s wife.  Pt. Was on his way to a urology apptmt.

## 2018-09-06 ENCOUNTER — Encounter: Payer: Self-pay | Admitting: Internal Medicine

## 2018-09-06 NOTE — Progress Notes (Signed)
Tubular adenoma - diminutive No recall - age My Chart letter

## 2018-09-07 ENCOUNTER — Telehealth: Payer: Self-pay | Admitting: Cardiovascular Disease

## 2018-09-07 DIAGNOSIS — I10 Essential (primary) hypertension: Secondary | ICD-10-CM

## 2018-09-07 MED ORDER — LOSARTAN POTASSIUM 25 MG PO TABS: 50.0000 mg | ORAL_TABLET | Freq: Every day | ORAL | 11 refills | Status: DC

## 2018-09-07 NOTE — Telephone Encounter (Signed)
Lets add Losartan 50 mg a day  His creatinine is around 1.5 so we will need to watch renal function closely  Check BMP in 2 weeks. Agree with follow up office visit in Feb.

## 2018-09-07 NOTE — Telephone Encounter (Signed)
Returned call to patient who c/o elevated BP 1 month or greater, systolic BP 037'N to 558'P mmHg. States his PCP recently reviewed BP readings and states she advised she notes gradual increase since Dr. Acie Fredrickson decreased Carvedilol dose to 6.25 mg BID on 10/25/16. He reports pulse 55 bpm on average. He denies change in diet, follows low sodium diet and denies SOB, weight gain or other concerns. States BP cuff has been working appropriately because wife also monitors her BP and she has consistent readings. I advised since pulse is < 60 bpm I will forward message to Dr. Acie Fredrickson for advice. I scheduled patient's yearly follow-up for February instead of April so that we can monitor progress. Patient verbalized understanding and agreement with plan and thanked me for the call.

## 2018-09-07 NOTE — Telephone Encounter (Signed)
Called patient and reviewed Dr. Elmarie Shiley advice with him. He agrees to start Losartan 50 mg daily; sending Rx for Losartan 25 mg tabs, take 2 daily due to back order of Losartan 50. He is scheduled for lab appointment on 1/23 and agrees to call back with questions or concerns. He will continue to monitor BP. He thanked me for the call.

## 2018-09-07 NOTE — Telephone Encounter (Signed)
New message   Pt c/o BP issue: STAT if pt c/o blurred vision, one-sided weakness or slurred speech  1. What are your last 5 BP readings? 140/70 143/75 145/74 148/73 137/75 2. Are you having any other symptoms (ex. Dizziness, headache, blurred vision, passed out)? No   3. What is your BP issue? Patient states that that this his elevated bp may be coming from carvedilol (COREG) 6.25 MG tablet. Please call to discuss.

## 2018-09-21 ENCOUNTER — Encounter: Payer: Self-pay | Admitting: Cardiovascular Disease

## 2018-09-21 ENCOUNTER — Other Ambulatory Visit: Payer: Medicare HMO | Admitting: *Deleted

## 2018-09-21 DIAGNOSIS — I10 Essential (primary) hypertension: Secondary | ICD-10-CM

## 2018-09-21 LAB — BASIC METABOLIC PANEL
BUN/Creatinine Ratio: 21 (ref 10–24)
BUN: 35 mg/dL — ABNORMAL HIGH (ref 8–27)
CO2: 25 mmol/L (ref 20–29)
Calcium: 9.8 mg/dL (ref 8.6–10.2)
Chloride: 101 mmol/L (ref 96–106)
Creatinine, Ser: 1.7 mg/dL — ABNORMAL HIGH (ref 0.76–1.27)
GFR calc Af Amer: 44 mL/min/{1.73_m2} — ABNORMAL LOW (ref >59)
GFR, EST NON AFRICAN AMERICAN: 38 mL/min/{1.73_m2} — AB (ref >59)
Glucose: 110 mg/dL — ABNORMAL HIGH (ref 65–99)
Potassium: 4.6 mmol/L (ref 3.5–5.2)
Sodium: 141 mmol/L (ref 134–144)

## 2018-10-10 ENCOUNTER — Encounter: Payer: Self-pay | Admitting: Cardiovascular Disease

## 2018-10-10 ENCOUNTER — Ambulatory Visit: Payer: Medicare HMO | Admitting: Cardiovascular Disease

## 2018-10-10 VITALS — BP 170/82 | HR 57 | Ht 69.0 in | Wt 167.8 lb

## 2018-10-10 DIAGNOSIS — E782 Mixed hyperlipidemia: Secondary | ICD-10-CM

## 2018-10-10 DIAGNOSIS — I10 Essential (primary) hypertension: Secondary | ICD-10-CM

## 2018-10-10 MED ORDER — LOSARTAN POTASSIUM 25 MG PO TABS: 50.0000 mg | ORAL_TABLET | Freq: Every day | ORAL | 3 refills | Status: DC

## 2018-10-10 NOTE — Addendum Note (Signed)
Addended by: Emmaline Life on: 10/10/2018 04:18 PM   Modules accepted: Orders

## 2018-10-10 NOTE — Patient Instructions (Addendum)

## 2018-10-10 NOTE — Addendum Note (Signed)
Addended by: Emmaline Life on: 10/10/2018 04:22 PM   Modules accepted: Orders

## 2018-10-10 NOTE — Progress Notes (Signed)
Cardiology Office Note   Date:  10/10/2018   ID:  Omar Sawyer, DOB [birth year], MRN [id number]  PCP:  Everardo Beals, NP  Cardiologist:   Mertie Moores, MD   Chief Complaint  Patient presents with  . Hyperlipidemia   Problem List: 1. Hypertension 2. Hyperlipidemia 3. Tonsilar cancer ( radiation and chemotherapy)   History of Present Illness:  Omar Sawyer is a 78 year old gentleman with a long history of hypertension and hyperlipidemia. He's the son of a previous patient of mine. [name]. He presents today for continued management of his hypertension. He avoids salt. He does eat out quite a bit. 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   He denies any chest pain or dyspnea with exertion.   September 28, 2012: He was started on HCTZ and his BP has been better. He is having some prostate issues. He denies any CP, syncope or presyncope.  04/15/2014:  He has been treated for tonsilar cancer ( radiation and chemotherapy)  He's not having any chest pain. He denies any dyspnea. He denies syncope.   Feb. 17, 2016:  Omar Sawyer is a 78 y.o. male who presents for follow up of his HTN. Omar Sawyer is doing well.  BP at home has been well controlled.  He brought his blood pressure log with him today and all his blood pressure readings are in the normal range.Marland Kitchen He's not having any episodes of chest pain or shortness of breath.  He's not exercising as much as he should. He's not having any difficulty doing any of his normal activities.  Feb. 16, 2017:  Doing great.  Has some left sided pain ( occurred after pressure washing his house.   Feb. 26, 2018:  Doing well from a cardiac standpoint HR has remained slow ( he is on Coreg 12. 5 BID )   December 01, 2017: BP readings are good at home. No CP or dyspnea.   No passing out or dyspnea.  Recent labs look great   Feb. 11, 2020 : Omar Sawyer seen today for follow-up visit.  His blood  pressure is elevated today. Has been elevated.   We started Losartan 50 mg and his BP has been better controlled.     Past Medical History:  Diagnosis Date  . Allergy   . Anxiety    mild new dx  . Arthritis   . Basal cell carcinoma   . Cataract    removed both eyes  . Edema   . Essential hypertension, benign   . GERD (gastroesophageal reflux disease)   . Hearing loss   . Hx of prostatitis   . Malignant neoplasm of tonsil ([place] [facility]) 10/03/12 bx   Tonsil =positive for p16(HR HPV MARKER)    sQUAMOUS CELL CARCINOMA  . Metastasis to lymph nodes ([facility]) Pet Scan 10/26/12   Right Level IIa abd Level III Lymph Nodes  . Mixed hyperlipidemia   . Mucositis 12/04/2012  . Neuromuscular disorder ([facility])    b/l tremors,   . Personal history of adenomatous colonic polyps 07/23/2012   2010  . Renal insufficiency 12/16/2012  . Renal insufficiency 12/16/2012  . S/P radiation therapy 11/13/2012-12/29/2012     Right tonsil/ bilateral neck/ total dose 70 Gy in 35 fractions  . Status post chemotherapy    concurrent chemo with q3 weeks Cisplatin and daily radiation between 11/13/2012 and 12/29/2012  . Thrush, oral 05/15/2013  . Unspecified sinusitis (chronic)     Past  Surgical History:  Procedure Laterality Date  . Biopsy of Right Tonsil Right 10/03/2012   Squamous Cell Carcinoma     . BLEPHAROPLASTY    . CATARACT EXTRACTION, BILATERAL    . COLONOSCOPY  2011   polyps. Dr. Carlean Purl.  Due now 2013  . POLYPECTOMY       Current Outpatient Medications  Medication Sig Dispense Refill  . aspirin 81 MG tablet Take 81 mg by mouth daily.    Marland Kitchen augmented betamethasone dipropionate (DIPROLENE-AF) 0.05 % cream Apply 1 application topically 2 (two) times daily.    . carvedilol (COREG) 6.25 MG tablet Take 1 tablet (6.25 mg total) by mouth 2 (two) times daily. 180 tablet 3  . DENTA 5000 PLUS 1.1 % CREA dental cream APPLY THIN RIBBON TO TOOTHBRUSH*BRUSH FOR 2 MINS* SPIT OUT EXCESS*DO NOT SWALLOW OR RINSE. USE NIGHT 51 g 0  .  fenofibrate 160 MG tablet TAKE 1 TABLET EVERY DAY 90 tablet 1  . losartan (COZAAR) 25 MG tablet Take 2 tablets (50 mg total) by mouth daily. 60 tablet 11  . omeprazole (PRILOSEC) 20 MG capsule Take 20 mg by mouth daily.     Marland Kitchen OVER THE COUNTER MEDICATION 3 (three) times daily. DE 3 Dry Eye Omega    . tamsulosin (FLOMAX) 0.4 MG CAPS Take 0.4 mg by mouth daily.     No current facility-administered medications for this visit.     Allergies:   Hydrocodone    Social History:  The patient  reports that he has never smoked. He has never used smokeless tobacco. He reports that he does not drink alcohol or use drugs.   Family History:  The patient's family history includes Breast cancer in his sister; Cancer in his sister; Diabetes in his mother; Heart attack in an other family member; Heart disease in his father and mother; Hyperlipidemia in his mother; Hypertension in his brother, father, and mother.    ROS: Noted in current history.  Otherwise review of systems is negative.   Physical Exam: Blood pressure (!) 170/82, pulse (!) 57, height 5\' 9"  (1.753 m), weight 167 lb 12.8 oz (76.1 kg), SpO2 99 %.  GEN:  Well nourished, well developed in no acute distress HEENT: Normal NECK: No JVD; No carotid bruits LYMPHATICS: No lymphadenopathy CARDIAC: RRR , no murmurs, rubs, gallops RESPIRATORY:  Clear to auscultation without rales, wheezing or rhonchi  ABDOMEN: Soft, non-tender, non-distended MUSCULOSKELETAL:  No edema; No deformity  SKIN: Warm and dry NEUROLOGIC:  Alert and oriented x 3  EKG:   October 10, 2018: Sinus bradycardia 57 beats a minute.   Recent Labs: 11/21/2017: ALT 11 09/21/2018: BUN 35; Creatinine, Ser 1.70; Potassium 4.6; Sodium 141    Lipid Panel    Component Value Date/Time   CHOL 138 11/21/2017 0837   TRIG 121 11/21/2017 0837   HDL 40 11/21/2017 0837   CHOLHDL 3.5 11/21/2017 0837   CHOLHDL 4 01/21/2015 0828   VLDL 24.2 01/21/2015 0828   LDLCALC 74 11/21/2017 0837     LDLDIRECT 70.0 10/17/2014 0838      Wt Readings from Last 3 Encounters:  10/10/18 167 lb 12.8 oz (76.1 kg)  08/29/18 164 lb (74.4 kg)  08/15/18 164 lb (74.4 kg)      Other studies Reviewed: Additional studies/ records that were reviewed today include: . Review of the above records demonstrates:    ASSESSMENT AND PLAN:  Problem List: 1. Hypertension-    bP is better now that he is on Losartan  Still occasionally eats salty foods.  - ate a ham sandwich today for lunch  .   He will continue to monitor .   2. Hyperlipidemia -    he brought labs from his primary medical doctor's office.  His labs look great.  To new current medications.   3. Tonsilar cancer ( radiation and chemotherapy)    4.  Carotid bruit -he has mild carotid disease.   .   Current medicines are reviewed at length with the patient today.  The patient does not have concerns regarding medicines.  The following changes have been made:  no change   Disposition:   FU with me in  6 months     Signed, Mertie Moores, MD  10/10/2018 3:56 PM    [facility] [place], [place], [place]  [address] Phone: [phone]; Fax: [phone]

## 2018-10-11 ENCOUNTER — Other Ambulatory Visit: Payer: Self-pay

## 2018-10-31 ENCOUNTER — Other Ambulatory Visit: Payer: Self-pay | Admitting: Cardiovascular Disease

## 2018-11-22 ENCOUNTER — Other Ambulatory Visit: Payer: Self-pay | Admitting: Cardiovascular Disease

## 2019-03-16 ENCOUNTER — Other Ambulatory Visit: Payer: Self-pay

## 2019-03-16 ENCOUNTER — Other Ambulatory Visit (HOSPITAL_COMMUNITY): Payer: Self-pay | Admitting: Cardiovascular Disease

## 2019-03-16 ENCOUNTER — Ambulatory Visit (HOSPITAL_COMMUNITY)
Admission: RE | Admit: 2019-03-16 | Discharge: 2019-03-16 | Disposition: A | Discharge status: Home / Self Care | Payer: Medicare HMO | Source: Ambulatory Visit | Attending: Cardiology | Admitting: Cardiology

## 2019-03-16 DIAGNOSIS — I6523 Occlusion and stenosis of bilateral carotid arteries: Secondary | ICD-10-CM

## 2019-03-19 ENCOUNTER — Other Ambulatory Visit: Payer: Self-pay | Admitting: Nurse Practitioner

## 2019-03-19 DIAGNOSIS — E782 Mixed hyperlipidemia: Secondary | ICD-10-CM

## 2019-03-19 DIAGNOSIS — I6523 Occlusion and stenosis of bilateral carotid arteries: Secondary | ICD-10-CM

## 2019-04-27 ENCOUNTER — Other Ambulatory Visit: Payer: Self-pay | Admitting: Cardiovascular Disease

## 2019-05-08 ENCOUNTER — Other Ambulatory Visit: Payer: Medicare HMO

## 2019-05-08 ENCOUNTER — Other Ambulatory Visit: Payer: Self-pay

## 2019-05-08 DIAGNOSIS — E782 Mixed hyperlipidemia: Secondary | ICD-10-CM

## 2019-05-08 DIAGNOSIS — I10 Essential (primary) hypertension: Secondary | ICD-10-CM

## 2019-05-08 LAB — HEPATIC FUNCTION PANEL
ALT: 14 IU/L (ref 0–44)
AST: 17 IU/L (ref 0–40)
Albumin: 4.2 g/dL (ref 3.7–4.7)
Alkaline Phosphatase: 56 IU/L (ref 39–117)
Bilirubin Total: 0.5 mg/dL (ref 0.0–1.2)
Bilirubin, Direct: 0.21 mg/dL (ref 0.00–0.40)
Total Protein: 6.2 g/dL (ref 6.0–8.5)

## 2019-05-08 LAB — BASIC METABOLIC PANEL WITH GFR
BUN/Creatinine Ratio: 18 (ref 10–24)
BUN: 32 mg/dL — ABNORMAL HIGH (ref 8–27)
CO2: 26 mmol/L (ref 20–29)
Calcium: 9.6 mg/dL (ref 8.6–10.2)
Chloride: 100 mmol/L (ref 96–106)
Creatinine, Ser: 1.78 mg/dL — ABNORMAL HIGH (ref 0.76–1.27)
GFR calc Af Amer: 41 mL/min/1.73 — ABNORMAL LOW
GFR calc non Af Amer: 36 mL/min/1.73 — ABNORMAL LOW
Glucose: 97 mg/dL (ref 65–99)
Potassium: 4.9 mmol/L (ref 3.5–5.2)
Sodium: 137 mmol/L (ref 134–144)

## 2019-05-08 LAB — LIPID PANEL
Chol/HDL Ratio: 3.3 ratio (ref 0.0–5.0)
Cholesterol, Total: 121 mg/dL (ref 100–199)
HDL: 37 mg/dL — ABNORMAL LOW (ref >39)
LDL Chol Calc (NIH): 69 mg/dL (ref 0–99)
Triglycerides: 70 mg/dL (ref 0–149)
VLDL Cholesterol Cal: 15 mg/dL (ref 5–40)

## 2019-05-10 ENCOUNTER — Encounter: Payer: Self-pay | Admitting: Cardiovascular Disease

## 2019-05-10 NOTE — Progress Notes (Signed)
Cardiology Office Note   Date:  05/10/2019   ID:  Omar Sawyer, DOB June 12, 1941, MRN [id number]  PCP:  Everardo Beals, NP  Cardiologist:   Mertie Moores, MD   No chief complaint on file.  Problem List: 1. Hypertension 2. 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 He's the son of a previous patient of mine. [name]. He presents today for continued management of his hypertension. He avoids salt. He does eat out quite a bit. 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   He denies any chest pain or dyspnea with exertion.   September 28, 2012: He was started on HCTZ and his BP has been better. He is having some prostate issues. He denies any CP, syncope or presyncope.  04/15/2014:  He has been treated for tonsilar cancer ( radiation and chemotherapy)  He's not having any chest pain. He denies any dyspnea. He denies syncope.   Feb. 17, 2016:  Omar Sawyer is a 78 y.o. male who presents for follow up of his HTN. Omar Sawyer is doing well.  BP at home has been well controlled.  He brought his blood pressure log with him today and all his blood pressure readings are in the normal range.Marland Kitchen He's not having any episodes of chest pain or shortness of breath.  He's not exercising as much as he should. He's not having any difficulty doing any of his normal activities.  Feb. 16, 2017:  Doing great.  Has some left sided pain ( occurred after pressure washing his house.   Feb. 26, 2018:  Doing well from a cardiac standpoint HR has remained slow ( he is on Coreg 12. 5 BID )   December 01, 2017: BP readings are good at home. No CP or dyspnea.   No passing out or dyspnea.  Recent labs look great   Feb. 11, 2020 : Omar Sawyer seen today for follow-up visit.  His blood pressure is elevated today. Has been elevated.   We started  Losartan 50 mg and his BP has been better controlled.   Sept. 11, 2020 Omar Sawyer is seen today for follow up of HTN and HLD   Exercising regularly . Walks 3-4 times a week for 20 minutes.  No CP or dyspnea with walking Brought his BP log.  HR and BP look great    Past Medical History:  Diagnosis Date  . Allergy   . Anxiety    mild new dx  . Arthritis   . Basal cell carcinoma   . Cataract    removed both eyes  . Edema   . Essential hypertension, benign   . GERD (gastroesophageal reflux disease)   . Hearing loss   . Hx of prostatitis   . Malignant neoplasm of tonsil ([facility]) 10/03/12 bx   Tonsil =positive for p16(HR HPV MARKER)    sQUAMOUS CELL CARCINOMA  . Metastasis to lymph nodes ([facility]) Pet Scan 10/26/12   Right Level IIa abd Level III Lymph Nodes  . Mixed hyperlipidemia   . Mucositis 12/04/2012  . Neuromuscular disorder (HCC)    b/l tremors,   . Personal history of adenomatous colonic polyps 07/23/2012   2010  . Renal insufficiency 12/16/2012  . Renal insufficiency 12/16/2012  . S/P radiation therapy 11/13/2012-12/29/2012     Right tonsil/ bilateral neck/ total dose 70 Gy in 35  fractions  . Status post chemotherapy    concurrent chemo with q3 weeks Cisplatin and daily radiation between 11/13/2012 and 12/29/2012  . Thrush, oral 05/15/2013  . Unspecified sinusitis (chronic)     Past Surgical History:  Procedure Laterality Date  . Biopsy of Right Tonsil Right 10/03/2012   Squamous Cell Carcinoma     . BLEPHAROPLASTY    . CATARACT EXTRACTION, BILATERAL    . COLONOSCOPY  2011   polyps. Dr. Carlean Purl.  Due now 2013  . POLYPECTOMY       Current Outpatient Medications  Medication Sig Dispense Refill  . aspirin 81 MG tablet Take 81 mg by mouth daily.    Marland Kitchen augmented betamethasone dipropionate (DIPROLENE-AF) 0.05 % cream Apply 1 application topically 2 (two) times daily.    . carvedilol (COREG) 6.25 MG tablet TAKE 1 TABLET TWICE DAILY 180 tablet 3  . DENTA 5000 PLUS 1.1 % CREA dental  cream APPLY THIN RIBBON TO TOOTHBRUSH*BRUSH FOR 2 MINS* SPIT OUT EXCESS*DO NOT SWALLOW OR RINSE. USE NIGHT 51 g 0  . fenofibrate 160 MG tablet TAKE 1 TABLET EVERY DAY 90 tablet 1  . losartan (COZAAR) 25 MG tablet Take 2 tablets (50 mg total) by mouth daily. 180 tablet 3  . omeprazole (PRILOSEC) 20 MG capsule Take 20 mg by mouth daily.     Marland Kitchen OVER THE COUNTER MEDICATION 3 (three) times daily. DE 3 Dry Eye Omega    . tamsulosin (FLOMAX) 0.4 MG CAPS Take 0.4 mg by mouth daily.     No current facility-administered medications for this visit.     Allergies:   Hydrocodone    Social History:  The patient  reports that he has never smoked. He has never used smokeless tobacco. He reports that he does not drink alcohol or use drugs.   Family History:  The patient's family history includes Breast cancer in his sister; Cancer in his sister; Diabetes in his mother; Heart attack in an other family member; Heart disease in his father and mother; Hyperlipidemia in his mother; Hypertension in his brother, father, and mother.    ROS: Noted in current history.  Otherwise review of systems is negative.  Physical Exam: There were no vitals taken for this visit.  GEN:  Well nourished, well developed in no acute distress HEENT: neck has radiation changes.  NECK: No JVD; No carotid bruits LYMPHATICS: No lymphadenopathy CARDIAC: RRR , no murmurs, rubs, gallops RESPIRATORY:  Clear to auscultation without rales, wheezing or rhonchi  ABDOMEN: Soft, non-tender, non-distended MUSCULOSKELETAL:  No edema; No deformity  SKIN: Warm and dry NEUROLOGIC:  Alert and oriented x 3   EKG:      Recent Labs: 05/08/2019: ALT 14; BUN 32; Creatinine, Ser 1.78; Potassium 4.9; Sodium 137    Lipid Panel    Component Value Date/Time   CHOL 121 05/08/2019 0739   TRIG 70 05/08/2019 0739   HDL 37 (L) 05/08/2019 0739   CHOLHDL 3.3 05/08/2019 0739   CHOLHDL 4 01/21/2015 0828   VLDL 24.2 01/21/2015 0828   LDLCALC 74  11/21/2017 0837   LDLDIRECT 70.0 10/17/2014 0838      Wt Readings from Last 3 Encounters:  10/10/18 167 lb 12.8 oz (76.1 kg)  08/29/18 164 lb (74.4 kg)  08/15/18 164 lb (74.4 kg)      Other studies Reviewed: Additional studies/ records that were reviewed today include: . Review of the above records demonstrates:    ASSESSMENT AND PLAN:  Problem List: 1. Hypertension- well  controlled .   Cont current meds.  Ambulates regularly   2. Hyperlipidemia -    Labs look great    3. Tonsilar cancer ( radiation and chemotherapy)    4.  Carotid bruit -stable    Current medicines are reviewed at length with the patient today.  The patient does not have concerns regarding medicines.  The following changes have been made:  no change   Disposition:   FU with me in  6 months     Signed, Mertie Moores, MD  05/10/2019 7:16 PM    [facility] [place], [place], [place]  [address] Phone: [phone]; Fax: [phone]

## 2019-05-11 ENCOUNTER — Encounter: Payer: Self-pay | Admitting: Cardiovascular Disease

## 2019-05-11 ENCOUNTER — Ambulatory Visit (INDEPENDENT_AMBULATORY_CARE_PROVIDER_SITE_OTHER): Payer: Medicare HMO | Admitting: Cardiovascular Disease

## 2019-05-11 ENCOUNTER — Other Ambulatory Visit: Payer: Self-pay

## 2019-05-11 VITALS — BP 158/70 | HR 60 | Ht 69.0 in | Wt 164.0 lb

## 2019-05-11 DIAGNOSIS — I6523 Occlusion and stenosis of bilateral carotid arteries: Secondary | ICD-10-CM

## 2019-05-11 DIAGNOSIS — E782 Mixed hyperlipidemia: Secondary | ICD-10-CM | POA: Diagnosis not present

## 2019-05-11 DIAGNOSIS — I10 Essential (primary) hypertension: Secondary | ICD-10-CM

## 2019-05-11 DIAGNOSIS — N289 Disorder of kidney and ureter, unspecified: Secondary | ICD-10-CM

## 2019-05-11 NOTE — Patient Instructions (Addendum)
Medication Instructions:  Your physician recommends that you continue on your current medications as directed. Please refer to the Current Medication list given to you today.  If you need a refill on your cardiac medications before your next appointment, please call your pharmacy.   Lab work: Your physician recommends that you return for lab work in: 1 year prior to or on same day as follow-up appt- Lipids, Liver Function, BMP  If you have labs (blood work) drawn today and your tests are completely normal, you will receive your results only by: Marland Kitchen MyChart Message (if you have MyChart) OR . A paper copy in the mail If you have any lab test that is abnormal or we need to change your treatment, we will call you to review the results.  Testing/Procedures: None Ordered  Follow-Up: At [facility] you and your health needs are our priority.  As part of our continuing mission to provide you with exceptional heart care, we have created designated Provider Care Teams.  These Care Teams include your primary Cardiologist (physician) and Advanced Practice Providers (APPs -  Physician Assistants and Nurse Practitioners) who all work together to provide you with the care you need, when you need it. You will need a follow up appointment in:  1 years.  Please call our office 2 months in advance to schedule this appointment.  You may see Mertie Moores, MD or one of the following Advanced Practice Providers on your designated Care Team: Richardson Dopp, PA-C [place], [place] . Daune Perch, NP  Any Other Special Instructions Will Be Listed Below (If Applicable).

## 2019-08-06 ENCOUNTER — Other Ambulatory Visit: Payer: Self-pay | Admitting: Cardiovascular Disease

## 2019-08-29 ENCOUNTER — Other Ambulatory Visit: Payer: Self-pay | Admitting: Cardiovascular Disease

## 2019-09-26 ENCOUNTER — Other Ambulatory Visit: Payer: Self-pay | Admitting: Cardiovascular Disease

## 2020-03-17 ENCOUNTER — Other Ambulatory Visit: Payer: Self-pay | Admitting: Cardiovascular Disease

## 2020-03-18 ENCOUNTER — Other Ambulatory Visit (HOSPITAL_COMMUNITY): Payer: Self-pay | Admitting: Cardiovascular Disease

## 2020-03-18 ENCOUNTER — Ambulatory Visit (HOSPITAL_COMMUNITY)
Admission: RE | Admit: 2020-03-18 | Discharge: 2020-03-18 | Disposition: A | Discharge status: Home / Self Care | Payer: Medicare HMO | Source: Ambulatory Visit | Attending: Cardiovascular Disease | Admitting: Cardiovascular Disease

## 2020-03-18 ENCOUNTER — Other Ambulatory Visit: Payer: Self-pay

## 2020-03-18 DIAGNOSIS — E782 Mixed hyperlipidemia: Secondary | ICD-10-CM | POA: Diagnosis present

## 2020-03-18 DIAGNOSIS — I6523 Occlusion and stenosis of bilateral carotid arteries: Secondary | ICD-10-CM

## 2020-05-07 ENCOUNTER — Other Ambulatory Visit: Payer: Medicare HMO | Admitting: *Deleted

## 2020-05-07 ENCOUNTER — Other Ambulatory Visit: Payer: Self-pay

## 2020-05-07 DIAGNOSIS — N289 Disorder of kidney and ureter, unspecified: Secondary | ICD-10-CM

## 2020-05-07 DIAGNOSIS — E782 Mixed hyperlipidemia: Secondary | ICD-10-CM

## 2020-05-07 DIAGNOSIS — I10 Essential (primary) hypertension: Secondary | ICD-10-CM

## 2020-05-07 DIAGNOSIS — I6523 Occlusion and stenosis of bilateral carotid arteries: Secondary | ICD-10-CM

## 2020-05-07 LAB — BASIC METABOLIC PANEL
BUN/Creatinine Ratio: 16 (ref 10–24)
BUN: 27 mg/dL (ref 8–27)
CO2: 27 mmol/L (ref 20–29)
Calcium: 9.4 mg/dL (ref 8.6–10.2)
Chloride: 103 mmol/L (ref 96–106)
Creatinine, Ser: 1.74 mg/dL — ABNORMAL HIGH (ref 0.76–1.27)
GFR calc Af Amer: 42 mL/min/{1.73_m2} — ABNORMAL LOW (ref >59)
GFR calc non Af Amer: 36 mL/min/{1.73_m2} — ABNORMAL LOW (ref >59)
Glucose: 99 mg/dL (ref 65–99)
Potassium: 4.7 mmol/L (ref 3.5–5.2)
Sodium: 142 mmol/L (ref 134–144)

## 2020-05-07 LAB — LIPID PANEL
Chol/HDL Ratio: 3 ratio (ref 0.0–5.0)
Cholesterol, Total: 121 mg/dL (ref 100–199)
HDL: 40 mg/dL (ref >39)
LDL Chol Calc (NIH): 68 mg/dL (ref 0–99)
Triglycerides: 60 mg/dL (ref 0–149)
VLDL Cholesterol Cal: 13 mg/dL (ref 5–40)

## 2020-05-07 LAB — HEPATIC FUNCTION PANEL
ALT: 15 IU/L (ref 0–44)
AST: 14 IU/L (ref 0–40)
Albumin: 4.2 g/dL (ref 3.7–4.7)
Alkaline Phosphatase: 57 IU/L (ref 48–121)
Bilirubin Total: 0.5 mg/dL (ref 0.0–1.2)
Bilirubin, Direct: 0.21 mg/dL (ref 0.00–0.40)
Total Protein: 6.4 g/dL (ref 6.0–8.5)

## 2020-05-11 ENCOUNTER — Encounter: Payer: Self-pay | Admitting: Cardiovascular Disease

## 2020-05-11 NOTE — Progress Notes (Signed)
Cardiology Office Note   Date:  05/12/2020   ID:  Omar Sawyer, DOB Feb 17, 1941, MRN [id number]  PCP:  Omar Beals, NP  Cardiologist:   Omar Moores, MD   Chief Complaint  Patient presents with  . Hyperlipidemia   Problem List: 1. Hypertension 2. Hyperlipidemia 3. Tonsilar cancer ( radiation and chemotherapy)     Omar Sawyer is a 79 year old gentleman with a long history of hypertension and hyperlipidemia. He's the son of a previous patient of mine. ( Omar Sawyer). He presents today for continued management of his hypertension. He avoids salt. He does eat out quite a bit. 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   He denies any chest pain or dyspnea with exertion.   September 28, 2012: He was started on HCTZ and his BP has been better. He is having some prostate issues. He denies any CP, syncope or presyncope.  04/15/2014:  He has been treated for tonsilar cancer ( radiation and chemotherapy)  He's not having any chest pain. He denies any dyspnea. He denies syncope.   Feb. 17, 2016:  Omar Sawyer is a 79 y.o. male who presents for follow up of his HTN. Omar Sawyer is doing well.  BP at home has been well controlled.  He brought his blood pressure log with him today and all his blood pressure readings are in the normal range.Marland Kitchen He's not having any episodes of chest pain or shortness of breath.  He's not exercising as much as he should. He's not having any difficulty doing any of his normal activities.  Feb. 16, 2017:  Doing great.  Has some left sided pain ( occurred after pressure washing his house.   Feb. 26, 2018:  Doing well from a cardiac standpoint HR has remained slow ( he is on Coreg 12. 5 BID )   December 01, 2017: BP readings are good at home. No CP or dyspnea.   No passing out or dyspnea.  Recent labs look great   Feb. 11, 2020 : Omar Sawyer seen today for follow-up visit.  His blood pressure is elevated today. Has  been elevated.   We started Losartan 50 mg and his BP has been better controlled.   Sept. 11, 2020 Omar Sawyer is seen today for follow up of HTN and HLD   Exercising regularly . Walks 3-4 times a week for 20 minutes.  No CP or dyspnea with walking Brought his BP log.  HR and BP look great   Sept. 13, 2021:  Omar Sawyer is seen today for follow up of his HTN, Has stable carotid disease  BP log from home shows well controlled BP for the most part ( its a bit elevated today )  No CP or dyspnea.   Still working some    Past Medical History:  Diagnosis Date  . Allergy   . Anxiety    mild new dx  . Arthritis   . Basal cell carcinoma   . Cataract    removed both eyes  . Edema   . Essential hypertension, benign   . GERD (gastroesophageal reflux disease)   . Hearing loss   . Hx of prostatitis   . Malignant neoplasm of tonsil (Omar Sawyer) 10/03/12 bx   Tonsil =positive for p16(HR HPV MARKER)    sQUAMOUS CELL CARCINOMA  . Metastasis to lymph nodes ([place]) Pet Scan 10/26/12   Right Level IIa abd Level III Lymph Nodes  . Mixed hyperlipidemia   .  Mucositis 12/04/2012  . Neuromuscular disorder (HCC)    b/l tremors,   . Personal history of adenomatous colonic polyps 07/23/2012   2010  . Renal insufficiency 12/16/2012  . Renal insufficiency 12/16/2012  . S/P radiation therapy 11/13/2012-12/29/2012     Right tonsil/ bilateral neck/ total dose 70 Gy in 35 fractions  . Status post chemotherapy    concurrent chemo with q3 weeks Cisplatin and daily radiation between 11/13/2012 and 12/29/2012  . Thrush, oral 05/15/2013  . Unspecified sinusitis (chronic)     Past Surgical History:  Procedure Laterality Date  . Biopsy of Right Tonsil Right 10/03/2012   Squamous Cell Carcinoma     . BLEPHAROPLASTY    . CATARACT EXTRACTION, BILATERAL    . COLONOSCOPY  2011   polyps. Dr. Carlean Purl.  Due now 2013  . POLYPECTOMY       Current Outpatient Medications  Medication Sig Dispense Refill  . aspirin 81 MG tablet Take 81 mg  by mouth daily.    . carvedilol (COREG) 6.25 MG tablet TAKE 1 TABLET TWICE DAILY 180 tablet 3  . Cholecalciferol (VITAMIN D3) 50 MCG (2000 UT) TABS Take 1 tablet by mouth daily.    . Cyanocobalamin (B-12) 1000 MCG TABS Take 1 tablet by mouth daily.    . DENTA 5000 PLUS 1.1 % CREA dental cream APPLY THIN RIBBON TO TOOTHBRUSH*BRUSH FOR 2 MINS* SPIT OUT EXCESS*DO NOT SWALLOW OR RINSE. USE NIGHT 51 g 0  . fenofibrate 160 MG tablet TAKE 1 TABLET EVERY DAY 90 tablet 3  . losartan (COZAAR) 25 MG tablet TAKE 2 TABLETS EVERY DAY 180 tablet 2  . Multiple Vitamin (MULTIVITAMIN) tablet Take 1 tablet by mouth daily.    Marland Kitchen omeprazole (PRILOSEC) 20 MG capsule Take 20 mg by mouth daily.     . tamsulosin (FLOMAX) 0.4 MG CAPS Take 0.4 mg by mouth daily.     No current facility-administered medications for this visit.    Allergies:   Hydrocodone    Social History:  The patient  reports that he has never smoked. He has never used smokeless tobacco. He reports that he does not drink alcohol and does not use drugs.   Family History:  The patient's family history includes Breast cancer in his sister; Cancer in his sister; Diabetes in his mother; Heart attack in an other family member; Heart disease in his father and mother; Hyperlipidemia in his mother; Hypertension in his brother, father, and mother.    ROS: Noted in current history.  Otherwise review of systems is negative.  Physical Exam: Blood pressure (!) 168/80, pulse 64, height 5\' 9"  (1.753 m), weight 161 lb (73 kg), SpO2 98 %.  GEN:  Well nourished, well developed in no acute distress HEENT: Normal NECK: No JVD; No carotid bruits,   Neck is very firm from radiation  LYMPHATICS: No lymphadenopathy CARDIAC: RRR , no murmurs, rubs, gallops RESPIRATORY:  Clear to auscultation without rales, wheezing or rhonchi  ABDOMEN: Soft, non-tender, non-distended MUSCULOSKELETAL:  No edema; No deformity  SKIN: Warm and dry NEUROLOGIC:  Alert and oriented x  3   EKG:    Sept. 13, 2021:  NSR at 64.   No ST or T wave changes. .  No changes from previous    Recent Labs: 05/07/2020: ALT 15; BUN 27; Creatinine, Ser 1.74; Potassium 4.7; Sodium 142    Lipid Panel    Component Value Date/Time   CHOL 121 05/07/2020 0843   TRIG 60 05/07/2020 0843  HDL 40 05/07/2020 0843   CHOLHDL 3.0 05/07/2020 0843   CHOLHDL 4 01/21/2015 0828   VLDL 24.2 01/21/2015 0828   LDLCALC 68 05/07/2020 0843   LDLDIRECT 70.0 10/17/2014 0838      Wt Readings from Last 3 Encounters:  05/12/20 161 lb (73 kg)  05/11/19 164 lb (74.4 kg)  10/10/18 167 lb 12.8 oz (76.1 kg)      Other studies Reviewed: Additional studies/ records that were reviewed today include: . Review of the above records demonstrates:    ASSESSMENT AND PLAN:  Problem List: 1. Hypertension-   BP is well controlled.    2. Hyperlipidemia -   Labs from last week were reviewed and are great.  Cont. Sam emets.    3. Tonsilar cancer ( radiation and chemotherapy)  - 7 years clear   4.  Carotid bruit -  Mild carotid diesease Will repeat in 2 years.    Current medicines are reviewed at length with the patient today.  The patient does not have concerns regarding medicines.  The following changes have been made:  no change   Disposition:   FU with me in  6 months     Signed, Omar Moores, MD  05/12/2020 8:37 AM    [facility] [address], [place], [place]  [address] Phone: [phone]; Fax: [phone]

## 2020-05-12 ENCOUNTER — Other Ambulatory Visit: Payer: Self-pay

## 2020-05-12 ENCOUNTER — Ambulatory Visit: Payer: Medicare HMO | Admitting: Cardiovascular Disease

## 2020-05-12 ENCOUNTER — Encounter: Payer: Self-pay | Admitting: Cardiovascular Disease

## 2020-05-12 VITALS — BP 168/80 | HR 64 | Ht 69.0 in | Wt 161.0 lb

## 2020-05-12 DIAGNOSIS — I10 Essential (primary) hypertension: Secondary | ICD-10-CM

## 2020-05-12 DIAGNOSIS — E782 Mixed hyperlipidemia: Secondary | ICD-10-CM | POA: Diagnosis not present

## 2020-05-12 DIAGNOSIS — I6523 Occlusion and stenosis of bilateral carotid arteries: Secondary | ICD-10-CM | POA: Diagnosis not present

## 2020-05-12 NOTE — Patient Instructions (Signed)
Medication Instructions:  Your provider recommends that you continue on your current medications as directed. Please refer to the Current Medication list given to you today.   *If you need a refill on your cardiac medications before your next appointment, please call your pharmacy*  Lab Work: Your provider recommends that you return for FASTING lab work a couple days prior to your visit.  Follow-Up: At [facility], you and your health needs are our priority.  As part of our continuing mission to provide you with exceptional heart care, we have created designated Provider Care Teams.  These Care Teams include your primary Cardiologist (physician) and Advanced Practice Providers (APPs -  Physician Assistants and Nurse Practitioners) who all work together to provide you with the care you need, when you need it. Your next appointment:   12 month(s) The format for your next appointment:   In Person Provider:   You may see Mertie Moores, MD or one of the following Advanced Practice Providers on your designated Care Team:    Richardson Dopp, PA-C  [place], [place]

## 2020-06-19 ENCOUNTER — Other Ambulatory Visit: Payer: Self-pay | Admitting: Cardiovascular Disease

## 2020-07-17 ENCOUNTER — Other Ambulatory Visit: Payer: Self-pay | Admitting: Cardiovascular Disease

## 2020-10-24 ENCOUNTER — Other Ambulatory Visit: Payer: Self-pay | Admitting: Cardiovascular Disease

## 2021-03-18 ENCOUNTER — Encounter: Payer: Self-pay | Admitting: *Deleted

## 2021-03-18 ENCOUNTER — Ambulatory Visit (HOSPITAL_COMMUNITY)
Admission: RE | Admit: 2021-03-18 | Discharge: 2021-03-18 | Disposition: A | Discharge status: Home / Self Care | Payer: Medicare HMO | Source: Ambulatory Visit | Attending: Cardiology | Admitting: Cardiology

## 2021-03-18 ENCOUNTER — Other Ambulatory Visit: Payer: Self-pay

## 2021-03-18 ENCOUNTER — Other Ambulatory Visit (HOSPITAL_COMMUNITY): Payer: Self-pay | Admitting: Cardiovascular Disease

## 2021-03-18 ENCOUNTER — Ambulatory Visit (INDEPENDENT_AMBULATORY_CARE_PROVIDER_SITE_OTHER): Payer: Medicare HMO | Admitting: Cardiology

## 2021-03-18 VITALS — BP 180/78 | HR 56

## 2021-03-18 DIAGNOSIS — I6523 Occlusion and stenosis of bilateral carotid arteries: Secondary | ICD-10-CM

## 2021-03-18 DIAGNOSIS — I10 Essential (primary) hypertension: Secondary | ICD-10-CM

## 2021-03-18 MED ORDER — CLONIDINE HCL 0.1 MG PO TABS
0.1000 mg | ORAL_TABLET | Freq: Once | ORAL | Status: AC
Start: 2021-03-18 — End: 2021-03-18
  Administered 2021-03-18: 0.1 mg via ORAL

## 2021-03-18 MED ORDER — CLONIDINE HCL 0.1 MG PO TABS
0.1000 mg | ORAL_TABLET | Freq: Once | ORAL | Status: AC
  Administered 2021-03-18: 0.1 mg via ORAL

## 2021-03-18 NOTE — Progress Notes (Signed)
Reason for visit:  patient in office today to have an Carotid doppler completed. RT tech spoke with Dr Ellyn Hack  about patient - patient blood pressure elevated. Patient is escorted to patient 's exam room to sit quietly .  Name of MD requesting visit: Dr Ellyn Hack   H&P:   ROS related to problem: blood pressure elevated while having carotid doppler  Assessment and plan per MD:  Per verbal order from Dr Ellyn Hack ( doctor of day)  -  give  0.1 mg of  Clonidine orally now .   Have patient to wait to be evaluate after medication has been administered. Patient in  patient's  exam room - sitting quietly  alert and orient.  B/p  220 /84, pulse 61 O2 sat 98 %.@ 2:40 pm. Patient is being visually being monitored.   RN rechecked blood pressure 210/80 , pulse 58 , O2 sat 98%. Patient has no complaints at present , still alert and oriented , sitting in chair  I exam room   3:45   b/p 218/80, pulse 60 , O sat 90 % see  vital signs flow sheet for the rest .  4:40 pm -  patient is still stable   alert and orient and scrolling through his  smartphone.  Dr Ellyn Hack reviewed  vital signs.  Per Dr Ellyn Hack , patient can go home.  Patient is aware  to check blood pressure at home if blood pressure systolic is still 779 /? Take extra dose Losartan , I f blood pressure is less than 160/? Do not take  patient verbalized understanding

## 2021-03-20 NOTE — Progress Notes (Signed)
As the DOD at the [facility] -> he was brought to my attention that this gentleman had elevated blood pressures while having his carotid Dopplers done.  He was totally asymptomatic however with blood pressures in the 200+ range, I felt that it was prudent for [place] to monitor him and treat his hypertension until it was a safe level for him to be discharged.  He ended up getting 2 doses of oral clonidine 1 mg with improvement of systolic blood pressure down to 180 mmHg.  I recommend that he check his blood pressures when he gets home, if still elevated he was instructed to take an extra dose of his losartan.   Glenetta Hew, MD

## 2021-04-06 ENCOUNTER — Other Ambulatory Visit: Payer: Self-pay | Admitting: Cardiovascular Disease

## 2021-05-05 ENCOUNTER — Other Ambulatory Visit: Payer: Medicare HMO | Admitting: *Deleted

## 2021-05-05 ENCOUNTER — Other Ambulatory Visit: Payer: Self-pay

## 2021-05-05 DIAGNOSIS — E782 Mixed hyperlipidemia: Secondary | ICD-10-CM

## 2021-05-05 DIAGNOSIS — I10 Essential (primary) hypertension: Secondary | ICD-10-CM

## 2021-05-05 LAB — BASIC METABOLIC PANEL
BUN/Creatinine Ratio: 20 (ref 10–24)
BUN: 35 mg/dL — ABNORMAL HIGH (ref 8–27)
CO2: 25 mmol/L (ref 20–29)
Calcium: 9.3 mg/dL (ref 8.6–10.2)
Chloride: 103 mmol/L (ref 96–106)
Creatinine, Ser: 1.73 mg/dL — ABNORMAL HIGH (ref 0.76–1.27)
Glucose: 90 mg/dL (ref 65–99)
Potassium: 4.6 mmol/L (ref 3.5–5.2)
Sodium: 141 mmol/L (ref 134–144)
eGFR: 39 mL/min/{1.73_m2} — ABNORMAL LOW (ref >59)

## 2021-05-05 LAB — LIPID PANEL
Chol/HDL Ratio: 2.9 ratio (ref 0.0–5.0)
Cholesterol, Total: 120 mg/dL (ref 100–199)
HDL: 42 mg/dL (ref >39)
LDL Chol Calc (NIH): 65 mg/dL (ref 0–99)
Triglycerides: 58 mg/dL (ref 0–149)
VLDL Cholesterol Cal: 13 mg/dL (ref 5–40)

## 2021-05-05 LAB — HEPATIC FUNCTION PANEL
ALT: 11 IU/L (ref 0–44)
AST: 15 IU/L (ref 0–40)
Albumin: 4.2 g/dL (ref 3.7–4.7)
Alkaline Phosphatase: 50 IU/L (ref 44–121)
Bilirubin Total: 0.4 mg/dL (ref 0.0–1.2)
Bilirubin, Direct: 0.17 mg/dL (ref 0.00–0.40)
Total Protein: 6.1 g/dL (ref 6.0–8.5)

## 2021-05-06 ENCOUNTER — Other Ambulatory Visit: Payer: Self-pay | Admitting: Cardiovascular Disease

## 2021-05-13 ENCOUNTER — Other Ambulatory Visit: Payer: Self-pay

## 2021-05-13 ENCOUNTER — Encounter: Payer: Self-pay | Admitting: Cardiovascular Disease

## 2021-05-13 ENCOUNTER — Ambulatory Visit: Payer: Medicare HMO | Admitting: Cardiovascular Disease

## 2021-05-13 VITALS — BP 180/88 | HR 63 | Ht 69.0 in | Wt 160.0 lb

## 2021-05-13 DIAGNOSIS — I10 Essential (primary) hypertension: Secondary | ICD-10-CM

## 2021-05-13 DIAGNOSIS — I6523 Occlusion and stenosis of bilateral carotid arteries: Secondary | ICD-10-CM

## 2021-05-13 MED ORDER — CHLORTHALIDONE 25 MG PO TABS: 25.0000 mg | ORAL_TABLET | Freq: Every day | ORAL | 11 refills | Status: DC

## 2021-05-13 MED ORDER — POTASSIUM CHLORIDE CRYS ER 10 MEQ PO TBCR: 10.0000 meq | EXTENDED_RELEASE_TABLET | Freq: Two times a day (BID) | ORAL | 11 refills | Status: DC

## 2021-05-13 NOTE — Progress Notes (Signed)
Cardiology Office Note   Date:  05/13/2021   ID:  Omar Sawyer, DOB 1941-06-25, MRN [id number]  PCP:  Everardo Beals, NP  Cardiologist:   Mertie Moores, MD   No chief complaint on file.  Problem List: 1. Hypertension 2. Hyperlipidemia 3. Tonsilar cancer ( radiation and chemotherapy)      Omar Sawyer is a 80 year old gentleman with a long history of hypertension and hyperlipidemia. He's the son of a previous patient of mine. ( Omar Sawyer).  He presents today for continued management of his hypertension.  He avoids salt.  He does eat out quite a bit.  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    He denies any chest pain or dyspnea with exertion.    September 28, 2012: He was started on HCTZ and his BP has been better.  He is having some prostate issues.  He denies any CP, syncope or presyncope.  04/15/2014:  He has been treated for tonsilar cancer ( radiation and chemotherapy)   He's not having any chest pain. He denies any dyspnea. He denies syncope.   Feb. 17, 2016:  Omar Sawyer is a 80 y.o. male who presents for follow up of his HTN. Omar Sawyer is doing well.  BP at home has been well controlled.  He brought his blood pressure log with him today and all his blood pressure readings are in the normal range.Marland Kitchen He's not having any episodes of chest pain or shortness of breath.  He's not exercising as much as he should. He's not having any difficulty doing any of his normal activities.  Feb. 16, 2017:  Doing great.  Has some left sided pain ( occurred after pressure washing his house.   Feb. 26, 2018:  Doing well from a cardiac standpoint HR has remained slow ( he is on Coreg 12. 5 BID )   December 01, 2017: BP readings are good at home. No CP or dyspnea.   No passing out or dyspnea.  Recent labs look great   Feb. 11, 2020 : Omar Sawyer seen today for follow-up visit.  His blood pressure is elevated today. Has been elevated.   We started  Losartan 50 mg and his BP has been better controlled.   Sept. 11, 2020 Omar Sawyer is seen today for follow up of HTN and HLD   Exercising regularly . Walks 3-4 times a week for 20 minutes.  No CP or dyspnea with walking Brought his BP log.  HR and BP look great   Sept. 13, 2021:  Omar Sawyer is seen today for follow up of his HTN, Has stable carotid disease  BP log from home shows well controlled BP for the most part ( its a bit elevated today )  No CP or dyspnea.   Still working some  May 13, 2021: Omar Sawyer seen today for follow-up of his hypertension and carotid artery disease. BP  has been running high  Has been eating some extra salt  Occasionally get exercise  Labs from 9/6 look good LDL is 65.   Past Medical History:  Diagnosis Date   Allergy    Anxiety    mild new dx   Arthritis    Basal cell carcinoma    Cataract    removed both eyes   Edema    Essential hypertension, benign    GERD (gastroesophageal reflux disease)    Hearing loss    Hx of prostatitis    Malignant  neoplasm of tonsil ([place]) 10/03/12 bx   Tonsil =positive for p16(HR HPV MARKER)    sQUAMOUS CELL CARCINOMA   Metastasis to lymph nodes ([place]) Pet Scan 10/26/12   Right Level IIa abd Level III Lymph Nodes   Mixed hyperlipidemia    Mucositis 12/04/2012   Neuromuscular disorder (HCC)    b/l tremors,    Personal history of adenomatous colonic polyps 07/23/2012   2010   Renal insufficiency 12/16/2012   Renal insufficiency 12/16/2012   S/P radiation therapy 11/13/2012-12/29/2012     Right tonsil/ bilateral neck/ total dose 70 Gy in 35 fractions   Status post chemotherapy    concurrent chemo with q3 weeks Cisplatin and daily radiation between 11/13/2012 and 12/29/2012   Thrush, oral 05/15/2013   Unspecified sinusitis (chronic)     Past Surgical History:  Procedure Laterality Date   Biopsy of Right Tonsil Right 10/03/2012   Squamous Cell Carcinoma      BLEPHAROPLASTY     CATARACT EXTRACTION, BILATERAL      COLONOSCOPY  2011   polyps. Dr. Carlean Purl.  Due now 2013   POLYPECTOMY       Current Outpatient Medications  Medication Sig Dispense Refill   aspirin 81 MG tablet Take 81 mg by mouth daily.     carvedilol (COREG) 6.25 MG tablet TAKE 1 TABLET TWICE DAILY 180 tablet 2   DENTA 5000 PLUS 1.1 % CREA dental cream APPLY THIN RIBBON TO TOOTHBRUSH*BRUSH FOR 2 MINS* SPIT OUT EXCESS*DO NOT SWALLOW OR RINSE. USE NIGHT 51 g 0   fenofibrate 160 MG tablet TAKE 1 TABLET EVERY DAY 90 tablet 3   losartan (COZAAR) 25 MG tablet TAKE 2 TABLETS EVERY DAY 180 tablet 2   Misc Natural Products (ELDERBERRY ZINC/VIT C/IMMUNE MT)      Multiple Vitamin (MULTIVITAMIN) tablet Take 1 tablet by mouth daily.     omeprazole (PRILOSEC) 20 MG capsule Take 20 mg by mouth daily.      Polyethyl Glycol-Propyl Glycol 0.4-0.3 % SOLN Place 1 drop into both eyes in the morning.     sodium fluoride (FLUORISHIELD) 1.1 % GEL dental gel sodium fluoride 1.1 % dental paste     tamsulosin (FLOMAX) 0.4 MG CAPS Take 0.4 mg by mouth daily.     Cholecalciferol (VITAMIN D3) 50 MCG (2000 UT) TABS Take 1 tablet by mouth daily. (Patient not taking: Reported on 05/13/2021)     Cyanocobalamin (B-12) 1000 MCG TABS Take 1 tablet by mouth daily. (Patient not taking: Reported on 05/13/2021)     No current facility-administered medications for this visit.    Allergies:   Hydrocodone-acetaminophen and Hydrocodone    Social History:  The patient  reports that he has never smoked. He has never used smokeless tobacco. He reports that he does not drink alcohol and does not use drugs.   Family History:  The patient's family history includes Breast cancer in his sister; Cancer in his sister; Diabetes in his mother; Heart attack in an other family member; Heart disease in his father and mother; Hyperlipidemia in his mother; Hypertension in his brother, father, and mother.    ROS: Noted in current history.  Otherwise review of systems is negative.  Physical  Exam: Blood pressure (!) 180/88, pulse 63, height '5\' 9"'$  (1.753 m), weight 160 lb (72.6 kg), SpO2 99 %.  GEN:  Well nourished, well developed in no acute distress HEENT: Normal NECK: neck is s/p radiation changes.  soft L carotid bruit  LYMPHATICS: No lymphadenopathy CARDIAC: RRR  RESPIRATORY:  Clear to auscultation without rales, wheezing or rhonchi  ABDOMEN: Soft, non-tender, non-distended MUSCULOSKELETAL:  No edema; No deformity  SKIN: Warm and dry NEUROLOGIC:  Alert and oriented x 3    EKG:   Sept 14, 2022: NSR at 15.  No St or T wave changes.     Recent Labs: 05/05/2021: ALT 11; BUN 35; Creatinine, Ser 1.73; Potassium 4.6; Sodium 141    Lipid Panel    Component Value Date/Time   CHOL 120 05/05/2021 0745   TRIG 58 05/05/2021 0745   HDL 42 05/05/2021 0745   CHOLHDL 2.9 05/05/2021 0745   CHOLHDL 4 01/21/2015 0828   VLDL 24.2 01/21/2015 0828   LDLCALC 65 05/05/2021 0745   LDLDIRECT 70.0 10/17/2014 0838      Wt Readings from Last 3 Encounters:  05/13/21 160 lb (72.6 kg)  05/12/20 161 lb (73 kg)  05/11/19 164 lb (74.4 kg)      Other studies Reviewed: Additional studies/ records that were reviewed today include: . Review of the above records demonstrates:    ASSESSMENT AND PLAN:  Problem List: 1. Hypertension-    His blood pressure has been elevated for the past several days.  He thinks he might be eating a little bit of extra salt but not all that much.  We will add chlorthalidone 25 mg a day.  We will add potassium chloride 20 mEq a day.  We will have him check a basic metabolic profile in 3 weeks.  He will return to see the hypertension clinic in 2 months.  I would consider adding amlodipine if his blood pressure remains elevated.  We will have him follow-up with an APP in 6 months.  2. Hyperlipidemia -    Lipids from last week look great.  Continue current medications.   3. Tonsilar cancer ( radiation and chemotherapy)  -    4.  Carotid bruit -   stable     Current medicines are reviewed at length with the patient today.  The patient does not have concerns regarding medicines.  The following changes have been made:  no change   Disposition:   FU with me in  1 year     Signed, Mertie Moores, MD  05/13/2021 8:12 AM    [facility] [address], [place], [place]  [address] Phone: [phone]; Fax: [phone]

## 2021-05-13 NOTE — Patient Instructions (Signed)
Medication Instructions:  Your physician has recommended you make the following change in your medication:  START Chlorthalidone (Hygroton) 25 mg once every morning START Kdur (Potassium chloride) 10 mEq twice daily (can take morning and night or both at same time)  *If you need a refill on your cardiac medications before your next appointment, please call your pharmacy*   Lab Work: Your physician recommends that you return for lab work in: 3 weeks for BMET  If you have labs (blood work) drawn today and your tests are completely normal, you will receive your results only by: [place] (if you have MyChart) OR A paper copy in the mail If you have any lab test that is abnormal or we need to change your treatment, we will call you to review the results.   Testing/Procedures: None Ordered   Follow-Up: Your physician recommends that you schedule a follow-up appointment in: 2 months with Hypertension Clinic   At [facility], you and your health needs are our priority.  As part of our continuing mission to provide you with exceptional heart care, we have created designated Provider Care Teams.  These Care Teams include your primary Cardiologist (physician) and Advanced Practice Providers (APPs -  Physician Assistants and Nurse Practitioners) who all work together to provide you with the care you need, when you need it.  Your next appointment:   6 month(s)  The format for your next appointment:   In Person  Provider:   You will see one of the following Advanced Practice Providers on your designated Care Team:   Richardson Dopp, PA-C [place], [place]

## 2021-05-31 ENCOUNTER — Other Ambulatory Visit: Payer: Self-pay | Admitting: Cardiovascular Disease

## 2021-06-03 ENCOUNTER — Other Ambulatory Visit: Payer: Self-pay

## 2021-06-03 ENCOUNTER — Other Ambulatory Visit: Payer: Medicare HMO | Admitting: *Deleted

## 2021-06-03 DIAGNOSIS — I6523 Occlusion and stenosis of bilateral carotid arteries: Secondary | ICD-10-CM

## 2021-06-03 DIAGNOSIS — I10 Essential (primary) hypertension: Secondary | ICD-10-CM

## 2021-06-03 LAB — BASIC METABOLIC PANEL
BUN/Creatinine Ratio: 19 (ref 10–24)
BUN: 38 mg/dL — ABNORMAL HIGH (ref 8–27)
CO2: 24 mmol/L (ref 20–29)
Calcium: 9.2 mg/dL (ref 8.6–10.2)
Chloride: 103 mmol/L (ref 96–106)
Creatinine, Ser: 2.02 mg/dL — ABNORMAL HIGH (ref 0.76–1.27)
Glucose: 147 mg/dL — ABNORMAL HIGH (ref 70–99)
Potassium: 4.5 mmol/L (ref 3.5–5.2)
Sodium: 140 mmol/L (ref 134–144)
eGFR: 33 mL/min/{1.73_m2} — ABNORMAL LOW (ref >59)

## 2021-06-05 ENCOUNTER — Telehealth: Payer: Self-pay | Admitting: Cardiovascular Disease

## 2021-06-05 DIAGNOSIS — I10 Essential (primary) hypertension: Secondary | ICD-10-CM

## 2021-06-05 NOTE — Telephone Encounter (Signed)
Patient was returning call for results 

## 2021-06-05 NOTE — Telephone Encounter (Signed)
Returned call to patient to review lab results and plan of care to d/c chlorthalidone and potassium chloride. I advised him to monitor BP for the next 2 weeks and call us back if BP readings are consistently > 140/80. Advised that BP should be checked at least 1-2 hours after taking carvedilol and losartan each morning. Patient states he has not felt well during the time he has been taking the chlorthalidone. I scheduled him for repeat lab on 10/20. He was grateful for the call.

## 2021-06-18 ENCOUNTER — Other Ambulatory Visit: Payer: Medicare HMO

## 2021-06-18 ENCOUNTER — Other Ambulatory Visit: Payer: Self-pay

## 2021-06-18 DIAGNOSIS — I10 Essential (primary) hypertension: Secondary | ICD-10-CM

## 2021-06-18 LAB — BASIC METABOLIC PANEL
BUN/Creatinine Ratio: 17 (ref 10–24)
BUN: 31 mg/dL — ABNORMAL HIGH (ref 8–27)
CO2: 25 mmol/L (ref 20–29)
Calcium: 8.9 mg/dL (ref 8.6–10.2)
Chloride: 104 mmol/L (ref 96–106)
Creatinine, Ser: 1.79 mg/dL — ABNORMAL HIGH (ref 0.76–1.27)
Glucose: 104 mg/dL — ABNORMAL HIGH (ref 70–99)
Potassium: 4.6 mmol/L (ref 3.5–5.2)
Sodium: 141 mmol/L (ref 134–144)
eGFR: 38 mL/min/{1.73_m2} — ABNORMAL LOW (ref >59)

## 2021-07-13 NOTE — Progress Notes (Signed)
Patient ID: Omar Sawyer                 DOB: [birth year]                      MRN: [id number]     HPI: Omar Sawyer is a 80 y.o. male referred by Dr. Acie Sawyer to HTN clinic. PMH is significant for HTN, HLD, carotid artery disease, and tonsillar cancer. At last visit with Dr. Acie Sawyer on 05/13/21, BP 180/88, started chlorthalidone 25 mg daily and potassium chloride 20 mEq daily. BMET three weeks later showed worsening renal function so chlorthalidone and KCl were stopped.   Today, patient arrives in good spirits accompanied by his wife. Reports his BP at home has been elevated but this morning it was higher than usual because he was nervous about coming in to the doctor's office. He reports adherence to medications. Denies dizziness, lightheadedness, headaches, and swelling. He used to be on amlodipine years ago and thinks this was stopped in 2014 because his blood pressure was controlled at that time which is when he was going through cancer treatments. Denies any issues with taking amlodipine previously.   Current HTN meds: losartan 50 mg daily, carvedilol 6.25 mg BID Previously tried: chlorthalidone (worsened renal function), HCTZ (stopped at hospital discharge in 2014), amlodipine (stopped in 2014 due to good BP control) BP goal: <130/80 mmHg  Family History: Breast cancer in his sister; Cancer in his sister; Diabetes in his mother; Heart attack in an other family member; Heart disease in his father and mother; Hyperlipidemia in his mother; Hypertension in his brother, father, and mother.  Social History: The patient reports that he has never smoked. He has never used smokeless tobacco. He reports that he does not drink alcohol and does not use drugs.   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 Reports BP is usually 134-157 over less than 76. BP this morning was 176/82. HR is usually 57-60.   Labs:  06/18/21: Scr 1.79, K 4.6, Na 141 (losartan 50 mg daily) 06/03/21: Scr 2.02, K 4.5, Na 140 (losartan 50 mg daily, chlorthalidone 25 mg daily, KCl 20 mEq daily) 05/05/21: Scr 1.73, K 4.6, Na 141 (losartan 50 mg daily)  Wt Readings from Last 3 Encounters:  05/13/21 160 lb (72.6 kg)  05/12/20 161 lb (73 kg)  05/11/19 164 lb (74.4 kg)   BP Readings from Last 3 Encounters:  05/13/21 (!) 180/88  03/18/21 (!) 180/78  05/12/20 (!) 168/80   Pulse Readings from Last 3 Encounters:  05/13/21 63  03/18/21 (!) 56  05/12/20 64    Renal function: CrCl cannot be calculated (Patient's most recent lab result is older than the maximum 21 days allowed.).  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[place]) 10/03/12 bx   Tonsil =positive for p16(HR HPV MARKER)    sQUAMOUS CELL CARCINOMA   Metastasis to lymph nodes ([place]) Pet Scan 10/26/12   Right Level IIa abd Level III Lymph Nodes   Mixed hyperlipidemia    Mucositis 12/04/2012   Neuromuscular disorder (HCC)    b/l tremors,    Personal history of adenomatous colonic polyps 07/23/2012   2010   Renal insufficiency 12/16/2012   Renal insufficiency 12/16/2012   S/P radiation therapy 11/13/2012-12/29/2012     Right tonsil/ bilateral neck/ total dose 70 Gy in 35 fractions   Status post chemotherapy    concurrent chemo with q3 weeks Cisplatin and daily radiation between 11/13/2012 and 12/29/2012   Thrush, oral 05/15/2013   Unspecified sinusitis (chronic)     Current  Outpatient Medications on File Prior to Visit  Medication Sig Dispense Refill   aspirin 81 MG tablet Take 81 mg by mouth daily.     carvedilol (COREG) 6.25 MG tablet TAKE 1 TABLET TWICE DAILY 180 tablet 2   Cholecalciferol (VITAMIN D3) 50 MCG (2000 UT) TABS Take 1 tablet by mouth daily. (Patient not taking: Reported on 05/13/2021)     Cyanocobalamin (B-12) 1000 MCG TABS Take 1 tablet by mouth daily. (Patient not taking: Reported on 05/13/2021)     DENTA 5000 PLUS 1.1 % CREA dental cream APPLY THIN RIBBON TO TOOTHBRUSH*BRUSH FOR 2 MINS* SPIT OUT EXCESS*DO NOT SWALLOW OR RINSE. USE NIGHT 51 g 0   fenofibrate 160 MG tablet TAKE 1 TABLET EVERY DAY 90 tablet 3   losartan (COZAAR) 25 MG tablet TAKE 2 TABLETS EVERY DAY 180 tablet 3   Misc Natural Products (ELDERBERRY ZINC/VIT C/IMMUNE MT)      Multiple Vitamin (MULTIVITAMIN) tablet Take 1 tablet by mouth daily.     omeprazole (PRILOSEC) 20 MG capsule Take 20 mg by mouth daily.      Polyethyl Glycol-Propyl Glycol 0.4-0.3 % SOLN Place 1 drop into both eyes in the morning.     sodium fluoride (FLUORISHIELD) 1.1 % GEL dental gel sodium fluoride 1.1 % dental paste     tamsulosin (FLOMAX) 0.4 MG CAPS Take 0.4 mg by mouth daily.     No current facility-administered medications on file prior to visit.    Allergies  Allergen Reactions   Hydrocodone-Acetaminophen Nausea And Vomiting   Hydrocodone Nausea And Vomiting     Assessment/Plan:  1. Hypertension - BP of 168/84 not at goal <130/80 mmHg. Will increase losartan to 100 mg daily with close follow up to check BMET. Will also start amlodipine 5 mg daily given significant BP elevations today and at last visit. He prefers to fill at [facility] for long-term medications but he would like to fill [facility]lod[facility]ine at [facility] today so he Omar start it sooner. I have sent a 30 day supply [facility] local [facility]. Once we know he does well with amlodipine and have settled on a dose long-term will send this to [facility]. He  notes that he has >6 months supply of losartan 25 mg tablets at home and he would like to use these up before being sent more. Instructed him to take four 25 mg tablets at one time to equal 100 mg daily and he confirms understanding. Counseled him to continue checking BP daily at home and to bring his BP cuff to the next visit so we Omar check it for accuracy with our manual cuff. Follow up in 2 weeks for BMET and  BP check.    Rebbeca Paul, PharmD PGY2 Ambulatory Care Pharmacy Resident 07/14/2021 9:20 AM

## 2021-07-14 ENCOUNTER — Other Ambulatory Visit: Payer: Self-pay

## 2021-07-14 ENCOUNTER — Ambulatory Visit: Payer: Medicare HMO | Admitting: Student-PharmD

## 2021-07-14 VITALS — BP 168/84 | HR 63

## 2021-07-14 DIAGNOSIS — I10 Essential (primary) hypertension: Secondary | ICD-10-CM | POA: Diagnosis not present

## 2021-07-14 MED ORDER — LOSARTAN POTASSIUM 100 MG PO TABS: 100.0000 mg | ORAL_TABLET | Freq: Every day | ORAL | 3 refills | Status: DC

## 2021-07-14 MED ORDER — AMLODIPINE BESYLATE 5 MG PO TABS: 5.0000 mg | ORAL_TABLET | Freq: Every day | ORAL | 0 refills | Status: DC

## 2021-07-14 NOTE — Patient Instructions (Signed)
It was nice to see you today!  Your goal blood pressure is less than 130/80 mmHg. In clinic, your blood pressure was 168/84 mmHg.  Medication Changes: Begin amlodipine 5 mg daily ([facility])  Increase losartan to 100 mg daily (you may take four of your 25 mg tablets)  Continue carvedilol 6.25 mg twice daily  Please bring your BP cuff with you to the next visit. We will check your labs at that time.   Monitor blood pressure at home daily and keep a log (on your phone or piece of paper) to bring with you to your next visit. Write down date, time, blood pressure and pulse.  Keep up the good work with diet and exercise. Aim for a diet full of vegetables, fruit and lean meats (chicken, [place], fish). Try to limit salt intake by eating fresh or frozen vegetables (instead of canned), rinse canned vegetables prior to cooking and do not add any additional salt to meals.

## 2021-07-30 ENCOUNTER — Other Ambulatory Visit: Payer: Self-pay

## 2021-07-30 ENCOUNTER — Ambulatory Visit: Payer: Medicare HMO | Admitting: Pharmacist

## 2021-07-30 VITALS — BP 166/72 | HR 64

## 2021-07-30 DIAGNOSIS — I10 Essential (primary) hypertension: Secondary | ICD-10-CM | POA: Diagnosis not present

## 2021-07-30 MED ORDER — LOSARTAN POTASSIUM 100 MG PO TABS: 100.0000 mg | ORAL_TABLET | Freq: Every day | ORAL | 3 refills | Status: DC

## 2021-07-30 MED ORDER — AMLODIPINE BESYLATE 10 MG PO TABS: 10.0000 mg | ORAL_TABLET | Freq: Every day | ORAL | 3 refills | Status: DC

## 2021-07-30 NOTE — Progress Notes (Signed)
Patient ID: Omar Sawyer                 DOB: Apr 13, 1941                      MRN: [id number]     HPI: Omar Sawyer is a 80 y.o. male referred by Dr. Acie Fredrickson to HTN clinic. PMH is significant for HTN, HLD, bilateral ICA stenosis, and tonsillar cancer. At last visit with Dr. Acie Fredrickson on 05/13/21, BP 180/88, started chlorthalidone 25 mg daily and potassium chloride 20 mEq daily. BMET three weeks later showed worsening renal function so chlorthalidone and KCl were stopped. At last visit with PharmD on 11/15, BP was elevated at 168/84. Losartan was increased to 100mg  daily and amlodipine 5mg  daily was added.  Today, patient arrives in good spirits. Reports tolerating medications well. Denies dizziness and LE edema. He used to be on amlodipine years ago and thinks this was stopped in 2014 because his blood pressure was controlled at that time which is when he was going through cancer treatments. Didn't have any trouble tolerating it at the time.  Pt brings in home bicep BP cuff and log today, has had cuff for a few years. Reports white coat HTN which is confirmed - home cuff measuring accurately but elevated at 166/75 and 162/76 on recheck compared to my manual reading of 166/72. Home readings notably better: 143/75, 127/71, 133/71, 135/67, 148/75, 140/77, HR 56-62. Checks BP in the morning about 1 hour after taking his AM meds. Doesn't drink caffeine.  Current HTN meds: losartan 100mg  daily, carvedilol 6.25 mg BID, amlodipine 5mg  daily  Previously tried: chlorthalidone (worsened renal function), HCTZ (stopped at hospital discharge in 2014), amlodipine (stopped in 2014 due to good BP control)  BP goal: <130/80 mmHg  Family History: Breast cancer in his sister; Cancer in his sister; Diabetes in his mother; Heart attack in an other family member; Heart disease in his father and mother; Hyperlipidemia in his mother; Hypertension in his brother, father, and mother.  Social History: The patient reports that  he has never smoked. He has never used smokeless tobacco. He reports that he does not drink alcohol and does not use drugs.   Diet: No caffeine, no soda/coffee, drinks Gatorade and water; eats pretzels and potato chips, eats a ham sandwich for lunch every day. 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 Home readings since last visit: 143/75, 127/71, 133/71, 135/67, 148/75, 140/77, HR 56-62  Labs:  06/18/21: Scr 1.79, K 4.6, Na 141 (losartan 50 mg daily) 06/03/21: Scr 2.02, K 4.5, Na 140 (losartan 50 mg daily, chlorthalidone 25 mg daily, KCl 20 mEq daily) 05/05/21: Scr 1.73, K 4.6, Na 141 (losartan 50 mg daily)  Wt Readings from Last 3 Encounters:  05/13/21 160 lb (72.6 kg)  05/12/20 161 lb (73 kg)  05/11/19 164 lb (74.4 kg)   BP Readings from Last 3 Encounters:  07/14/21 (!) 168/84  05/13/21 (!) 180/88  03/18/21 (!) 180/78   Pulse Readings from Last 3 Encounters:  07/14/21 63  05/13/21 63  03/18/21 (!) 56    Renal function: CrCl cannot be calculated (Patient's most recent lab result is older than the maximum 21 days allowed.).  Past Medical History:  Diagnosis Date  Allergy    Anxiety    mild new dx   Arthritis    Basal cell carcinoma    Cataract    removed both eyes   Edema    Essential hypertension, benign    GERD (gastroesophageal reflux disease)    Hearing loss    Hx of prostatitis    Malignant neoplasm of tonsil ([place]) 10/03/12 bx   Tonsil =positive for p16(HR HPV MARKER)    sQUAMOUS CELL CARCINOMA   Metastasis to lymph nodes ([place]) Pet Scan 10/26/12   Right Level IIa abd Level III Lymph Nodes   Mixed hyperlipidemia    Mucositis 12/04/2012   Neuromuscular disorder (HCC)    b/l tremors,    Personal history of adenomatous colonic polyps 07/23/2012   2010   Renal insufficiency  12/16/2012   Renal insufficiency 12/16/2012   S/P radiation therapy 11/13/2012-12/29/2012     Right tonsil/ bilateral neck/ total dose 70 Gy in 35 fractions   Status post chemotherapy    concurrent chemo with q3 weeks Cisplatin and daily radiation between 11/13/2012 and 12/29/2012   Thrush, oral 05/15/2013   Unspecified sinusitis (chronic)     Current Outpatient Medications on File Prior to Visit  Medication Sig Dispense Refill   amLODipine (NORVASC) 5 MG tablet Take 1 tablet (5 mg total) by mouth daily. 30 tablet 0   aspirin 81 MG tablet Take 81 mg by mouth daily.     carvedilol (COREG) 6.25 MG tablet TAKE 1 TABLET TWICE DAILY 180 tablet 2   Cholecalciferol (VITAMIN D3) 50 MCG (2000 UT) TABS Take 1 tablet by mouth daily. (Patient not taking: Reported on 05/13/2021)     Cyanocobalamin (B-12) 1000 MCG TABS Take 1 tablet by mouth daily. (Patient not taking: Reported on 05/13/2021)     DENTA 5000 PLUS 1.1 % CREA dental cream APPLY THIN RIBBON TO TOOTHBRUSH*BRUSH FOR 2 MINS* SPIT OUT EXCESS*DO NOT SWALLOW OR RINSE. USE NIGHT 51 g 0   fenofibrate 160 MG tablet TAKE 1 TABLET EVERY DAY 90 tablet 3   losartan (COZAAR) 100 MG tablet Take 1 tablet (100 mg total) by mouth daily. 90 tablet 3   Misc Natural Products (ELDERBERRY ZINC/VIT C/IMMUNE MT)      Multiple Vitamin (MULTIVITAMIN) tablet Take 1 tablet by mouth daily.     omeprazole (PRILOSEC) 20 MG capsule Take 20 mg by mouth daily.      Polyethyl Glycol-Propyl Glycol 0.4-0.3 % SOLN Place 1 drop into both eyes in the morning.     sodium fluoride (FLUORISHIELD) 1.1 % GEL dental gel sodium fluoride 1.1 % dental paste     tamsulosin (FLOMAX) 0.4 MG CAPS Take 0.4 mg by mouth daily.     No current facility-administered medications on file prior to visit.    Allergies  Allergen Reactions   Hydrocodone-Acetaminophen Nausea And Vomiting   Hydrocodone Nausea And Vomiting     Assessment/Plan:  1. Hypertension - BP remains elevated in clinic above goal  <130/36mmHg although pt has white coat HTN. Home BP cuff accuracy has been confirmed, will adjust BP meds based on home readings rather than clinic readings. Home readings did improve to [id number] systolic with most recent med changes. Checking BMET today with recent dose increase of losartan. Will increase amlodipine from 5mg  to 10mg  daily and continue losartan 100mg  daily and carvedilol 6.25mg  BID. Pt aware to continue monitoring his BP at home. I'll call him in 3 weeks to follow up with home readings.   Chasyn Cinque E. Rolena Knutson,  PharmD, BCACP, [facility] [address], [place], [place] [address] Phone: [phone]; Fax: [phone] 07/30/2021 1:52 PM

## 2021-07-30 NOTE — Patient Instructions (Addendum)
It was nice to meet you today!   Your blood pressure goal is < 130/34mmHg  Increase your amlodipine to 10mg  daily when your mail order prescription arrives  Continue taking higher dose of losartan 100mg  daily, I sent in a refill for this dose to your mail order pharmacy too  We will check lab work today  Continue to monitor your blood pressure at home  I'll give you a call in 3 weeks to see how your readings are looking  Call Jinny Blossom, PharmD with any concerns before then [phone]

## 2021-07-31 ENCOUNTER — Telehealth: Payer: Self-pay | Admitting: Pharmacist

## 2021-07-31 DIAGNOSIS — E782 Mixed hyperlipidemia: Secondary | ICD-10-CM

## 2021-07-31 DIAGNOSIS — I10 Essential (primary) hypertension: Secondary | ICD-10-CM

## 2021-07-31 LAB — BASIC METABOLIC PANEL
BUN/Creatinine Ratio: 17 (ref 10–24)
BUN: 37 mg/dL — ABNORMAL HIGH (ref 8–27)
CO2: 25 mmol/L (ref 20–29)
Calcium: 9.4 mg/dL (ref 8.6–10.2)
Chloride: 103 mmol/L (ref 96–106)
Creatinine, Ser: 2.18 mg/dL — ABNORMAL HIGH (ref 0.76–1.27)
Glucose: 102 mg/dL — ABNORMAL HIGH (ref 70–99)
Potassium: 5 mmol/L (ref 3.5–5.2)
Sodium: 138 mmol/L (ref 134–144)
eGFR: 30 mL/min/{1.73_m2} — ABNORMAL LOW (ref >59)

## 2021-07-31 MED ORDER — LOSARTAN POTASSIUM 50 MG PO TABS: 50.0000 mg | ORAL_TABLET | Freq: Every day | ORAL | 3 refills | Status: DC

## 2021-07-31 NOTE — Telephone Encounter (Signed)
SCr has increased from 1.79 to 2.18 after increasing losartan from 50mg  to 100mg . Will decrease back to 50mg  daily. Amlodipine was increased from 5mg  to 10mg  daily yesterday to help with BP. Will also stop fenofibrate given pt's renal function and normal TG. Will recheck BMET and lipids in 2 weeks. Pt aware of med plan.

## 2021-08-06 ENCOUNTER — Other Ambulatory Visit: Payer: Self-pay | Admitting: Cardiovascular Disease

## 2021-08-06 DIAGNOSIS — I10 Essential (primary) hypertension: Secondary | ICD-10-CM

## 2021-08-14 ENCOUNTER — Other Ambulatory Visit: Payer: Medicare HMO | Admitting: *Deleted

## 2021-08-14 ENCOUNTER — Other Ambulatory Visit: Payer: Self-pay

## 2021-08-14 DIAGNOSIS — E782 Mixed hyperlipidemia: Secondary | ICD-10-CM

## 2021-08-14 DIAGNOSIS — I10 Essential (primary) hypertension: Secondary | ICD-10-CM

## 2021-08-14 LAB — BASIC METABOLIC PANEL
BUN/Creatinine Ratio: 20 (ref 10–24)
BUN: 32 mg/dL — ABNORMAL HIGH (ref 8–27)
CO2: 24 mmol/L (ref 20–29)
Calcium: 9.3 mg/dL (ref 8.6–10.2)
Chloride: 102 mmol/L (ref 96–106)
Creatinine, Ser: 1.6 mg/dL — ABNORMAL HIGH (ref 0.76–1.27)
Glucose: 95 mg/dL (ref 70–99)
Potassium: 4.5 mmol/L (ref 3.5–5.2)
Sodium: 139 mmol/L (ref 134–144)
eGFR: 43 mL/min/{1.73_m2} — ABNORMAL LOW (ref >59)

## 2021-08-14 LAB — LIPID PANEL
Chol/HDL Ratio: 3.5 ratio (ref 0.0–5.0)
Cholesterol, Total: 149 mg/dL (ref 100–199)
HDL: 43 mg/dL (ref >39)
LDL Chol Calc (NIH): 88 mg/dL (ref 0–99)
Triglycerides: 97 mg/dL (ref 0–149)
VLDL Cholesterol Cal: 18 mg/dL (ref 5–40)

## 2021-08-18 ENCOUNTER — Telehealth: Payer: Self-pay | Admitting: Pharmacist

## 2021-08-18 NOTE — Telephone Encounter (Signed)
Called pt to follow up with home BP readings. SCr improved notably from 2.18 to 1.6 after decreasing losartan from 100mg  to 50mg  daily and stopping fenofibrate. TG have remained normal. Home BP readings as follows:  12/1 - 12/5: stable around 133/53, HR 60 12/5: 124/65, HR 60 12/6: 138/71, HR 56 Since then, 121/66, 133/72, 104/59, 116/66.  Pt tolerating higher dose of amlodipine well. Will continue current meds, pt aware to call with any concerns.

## 2021-09-02 ENCOUNTER — Telehealth: Payer: Self-pay | Admitting: Pharmacist

## 2021-09-02 NOTE — Telephone Encounter (Signed)
Pt dropped off list of home BP readings noted below. Currently taking losartan 50mg  daily (SCr increase on 100mg  daily), amlodipine 10mg  daily, and carvedilol 6.25mg  BID (HR ~60). Called pt, he reports BP readings even better. In past few weeks, systolic has been no higher than 133, mostly 113, 116, 118, 123, 114 etc. He is aware to continue current meds and will call with any concerns.

## 2021-11-12 ENCOUNTER — Encounter: Payer: Self-pay | Admitting: Cardiovascular Disease

## 2021-11-12 ENCOUNTER — Other Ambulatory Visit: Payer: Self-pay

## 2021-11-12 ENCOUNTER — Ambulatory Visit: Payer: Medicare HMO | Admitting: Cardiovascular Disease

## 2021-11-12 VITALS — BP 152/74 | HR 67 | Ht 69.0 in | Wt 165.2 lb

## 2021-11-12 DIAGNOSIS — I10 Essential (primary) hypertension: Secondary | ICD-10-CM | POA: Diagnosis not present

## 2021-11-12 DIAGNOSIS — E782 Mixed hyperlipidemia: Secondary | ICD-10-CM | POA: Diagnosis not present

## 2021-11-12 NOTE — Progress Notes (Signed)
? ?Cardiology Office Note ? ? ?Date:  11/12/2021  ? ?ID:  Omar Sawyer, DOB 08-06-1941, MRN [id number] ? ?PCP:  Everardo Beals, NP  ?Cardiologist:   Mertie Moores, MD  ? ?Chief Complaint  ?Patient presents with  ? Hypertension  ? Hyperlipidemia  ? ? ?Problem List: ?1. Hypertension ?2. Hyperlipidemia ?3. Tonsilar cancer ( radiation and chemotherapy)   ? ? ? ?Omar Sawyer is a 80 year old gentleman with a long history of hypertension and hyperlipidemia. He's the son of a previous patient of mine. [name].  He presents today for continued management of his hypertension.  He avoids salt.  He does eat out quite a bit.  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   ? ?He denies any chest pain or dyspnea with exertion.   ? ?September 28, 2012: ?He was started on HCTZ and his BP has been better.  He is having some prostate issues.  He denies any CP, syncope or presyncope. ? ?04/15/2014: ? ?He has been treated for tonsilar cancer ( radiation and chemotherapy)   ?He's not having any chest pain. He denies any dyspnea. He denies syncope. ?  ?Feb. 17, 2016: ? ?Omar Sawyer is a 82 y.o. male who presents for follow up of his HTN. ?Clements is doing well.  BP at home has been well controlled.  He brought his blood pressure log with him today and all his blood pressure readings are in the normal range.Marland Kitchen He's not having any episodes of chest pain or shortness of breath. ? ?He's not exercising as much as he should. He's not having any difficulty doing any of his normal activities. ? ?Feb. 16, 2017: ? ?Doing great.  Has some left sided pain ( occurred after pressure washing his house.  ? ?Feb. 26, 2018: ? ?Doing well from a cardiac standpoint ?HR has remained slow ( he is on Coreg 12. 5 BID )  ? ?December 01, 2017: ?BP readings are good at home. ?No CP or dyspnea.   No passing out or dyspnea.  ?Recent labs look great  ? ?Feb. 11, 2020 : ?Omar Sawyer seen today for follow-up visit.  His blood pressure is  elevated today. ?Has been elevated.   We started Losartan 50 mg and his BP has been better controlled.  ? ?Sept. 11, 2020 ?Omar Sawyer is seen today for follow up of HTN and HLD  ? ?Exercising regularly . ?Walks 3-4 times a week for 20 minutes.  ?No CP or dyspnea with walking ?Brought his BP log.  HR and BP look great  ? ?Sept. 13, 2021: ? ?Omar Sawyer is seen today for follow up of his HTN, ?Has stable carotid disease  ?BP log from home shows well controlled BP for the most part ( its a bit elevated today )  ?No CP or dyspnea.   Still working some ? ?May 13, 2021: ?Omar Sawyer seen today for follow-up of his hypertension and carotid artery disease. ?BP  has been running high  ?Has been eating some extra salt  ?Occasionally get exercise  ?Labs from 9/6 look good ?LDL is 65.  ? ?November 12, 2021: ? ?Seen for HTN ?We started chlorthalidone but he had renal insufficiency  ?Megan started amlodipine in its place.  ?Brought his home BP log.  His readings look great  ? ?On amlopidine  10  ?Has a sensation of tightness in his L leg , goes away by morning  ?May be due to some  fluid retention due to amlodpine  ?Still eating processed meats ( lunch meat ) regularly  ?He will reduce his intake of salty meats ? ?No CP ?Not much exercise , knows he needs to walk more ? ? ? labs from Dec. 22 were reviewed. ?Total cholesterol is 149 ?HDL is 43 ?LDL is 88 ?Triglyceride level is 97 ? ? ?Past Medical History:  ?Diagnosis Date  ? Allergy   ? Anxiety   ? mild new dx  ? Arthritis   ? Basal cell carcinoma   ? Cataract   ? removed both eyes  ? Edema   ? Essential hypertension, benign   ? GERD (gastroesophageal reflux disease)   ? Hearing loss   ? Hx of prostatitis   ? Malignant neoplasm of tonsil ([place]) 10/03/12 bx  ? Tonsil =positive for p16(HR HPV MARKER)    sQUAMOUS CELL CARCINOMA  ? Metastasis to lymph nodes ([facility]) Pet Scan 10/26/12  ? Right Level IIa abd Level III Lymph Nodes  ? Mixed hyperlipidemia   ? Mucositis 12/04/2012  ? Neuromuscular disorder  (HCC)   ? b/l tremors,   ? Personal history of adenomatous colonic polyps 07/23/2012  ? 2010  ? Renal insufficiency 12/16/2012  ? Renal insufficiency 12/16/2012  ? S/P radiation therapy 11/13/2012-12/29/2012    ? Right tonsil/ bilateral neck/ total dose 70 Gy in 35 fractions  ? Status post chemotherapy   ? concurrent chemo with q3 weeks Cisplatin and daily radiation between 11/13/2012 and 12/29/2012  ? Thrush, oral 05/15/2013  ? Unspecified sinusitis (chronic)   ? ? ?Past Surgical History:  ?Procedure Laterality Date  ? Biopsy of Right Tonsil Right 10/03/2012  ? Squamous Cell Carcinoma     ? BLEPHAROPLASTY    ? CATARACT EXTRACTION, BILATERAL    ? COLONOSCOPY  2011  ? polyps. Dr. Carlean Purl.  Due now 2013  ? POLYPECTOMY    ? ? ? ?Current Outpatient Medications  ?Medication Sig Dispense Refill  ? amLODipine (NORVASC) 10 MG tablet Take 1 tablet (10 mg total) by mouth daily. 90 tablet 3  ? aspirin 81 MG tablet Take 81 mg by mouth daily.    ? carvedilol (COREG) 6.25 MG tablet TAKE 1 TABLET TWICE DAILY 180 tablet 2  ? latanoprost (XALATAN) 0.005 % ophthalmic solution 1 drop at bedtime.    ? losartan (COZAAR) 50 MG tablet Take 1 tablet (50 mg total) by mouth daily. 90 tablet 3  ? Misc Natural Products (ELDERBERRY ZINC/VIT C/IMMUNE MT)     ? Multiple Vitamin (MULTIVITAMIN) tablet Take 1 tablet by mouth daily.    ? omeprazole (PRILOSEC) 20 MG capsule Take 20 mg by mouth daily.     ? Polyethyl Glycol-Propyl Glycol 0.4-0.3 % SOLN Place 1 drop into both eyes in the morning.    ? sodium fluoride (FLUORISHIELD) 1.1 % GEL dental gel sodium fluoride 1.1 % dental paste    ? tamsulosin (FLOMAX) 0.4 MG CAPS Take 0.4 mg by mouth daily.    ? ?No current facility-administered medications for this visit.  ? ? ?Allergies:   Hydrocodone-acetaminophen and Hydrocodone  ? ? ?Social History:  The patient  reports that he has never smoked. He has never used smokeless tobacco. He reports that he does not drink alcohol and does not use drugs.  ? ?Family  History:  The patient's family history includes Breast cancer in his sister; Cancer in his sister; Diabetes in his mother; Heart attack in an other family member; Heart disease in his father and  mother; Hyperlipidemia in his mother; Hypertension in his brother, father, and mother.  ? ? ?ROS: Noted in current history.  Otherwise review of systems is negative. ? ? Physical Exam: ?Blood pressure (!) 152/74, pulse 67, height '5\' 9"'$  (1.753 m), weight 165 lb 3.2 oz (74.9 kg), SpO2 99 %. ? ?GEN:  elderly male,   in no acute distress ?HEENT: Normal ?NECK: neck is stiff ( from XRT) ,  Bilat carotid bruits, R>L ?LYMPHATICS: No lymphadenopathy ?CARDIAC: RRR   ?RESPIRATORY:  Clear to auscultation without rales, wheezing or rhonchi  ?ABDOMEN: Soft, non-tender, non-distended ?MUSCULOSKELETAL:  No edema; No deformity  ?SKIN: Warm and dry ?NEUROLOGIC:  Alert and oriented x 3 ? ? ?EKG:     ? ? ?Recent Labs: ?05/05/2021: ALT 11 ?08/14/2021: BUN 32; Creatinine, Ser 1.60; Potassium 4.5; Sodium 139  ? ? ?Lipid Panel ?   ?Component Value Date/Time  ? CHOL 149 08/14/2021 0845  ? TRIG 97 08/14/2021 0845  ? HDL 43 08/14/2021 0845  ? CHOLHDL 3.5 08/14/2021 0845  ? CHOLHDL 4 01/21/2015 0828  ? VLDL 24.2 01/21/2015 0828  ? [place] 88 08/14/2021 0845  ? LDLDIRECT 70.0 10/17/2014 0838  ? ?  ? ?Wt Readings from Last 3 Encounters:  ?11/12/21 165 lb 3.2 oz (74.9 kg)  ?05/13/21 160 lb (72.6 kg)  ?05/12/20 161 lb (73 kg)  ?  ? ? ?Other studies Reviewed: ?Additional studies/ records that were reviewed today include: . ?Review of the above records demonstrates:  ? ? ?ASSESSMENT AND PLAN: ? ?Problem List: ?1. Hypertension-    ? BP remains elevated.  He eats ham every day for lunch .  Advised him to cut out his salty / processed lunch meat - suggested [place] or cicken or baked / grilled fish  ? ?We will have him follow-up with an APP in 6 months. ? ?2. Hyperlipidemia -   labs from Dec. 22 were reviewed. ?Total cholesterol is 149 ?HDL is 43 ?LDL is  88 ?Triglyceride level is 97 ? ?Recheck lipids, ALT and basic metabolic profile when he sees APP in 6 months. ? ?  ? ? ?3. Tonsilar cancer ( radiation and chemotherapy)  -  ? ? ?4.  Carotid bruit -  stable  ? ? ? ?Current

## 2021-11-12 NOTE — Patient Instructions (Signed)
Medication Instructions:  ?Your physician recommends that you continue on your current medications as directed. Please refer to the Current Medication list given to you today. ? ?*If you need a refill on your cardiac medications before your next appointment, please call your pharmacy* ? ?Lab Work: ?In 6 months: Lipids, ALT, BMP ?If you have labs (blood work) drawn today and your tests are completely normal, you will receive your results only by: ?MyChart Message (if you have MyChart) OR ?A paper copy in the mail ?If you have any lab test that is abnormal or we need to change your treatment, we will call you to review the results. ? ?Testing/Procedures: ?NONE ? ?Follow-Up: ?At [facility], you and your health needs are our priority.  As part of our continuing mission to provide you with exceptional heart care, we have created designated Provider Care Teams.  These Care Teams include your primary Cardiologist (physician) and Advanced Practice Providers (APPs -  Physician Assistants and Nurse Practitioners) who all work together to provide you with the care you need, when you need it. ? ?We recommend signing up for the patient portal called "MyChart".  Sign up information is provided on this After Visit Summary.  MyChart is used to connect with patients for Virtual Visits (Telemedicine).  Patients are able to view lab/test results, encounter notes, upcoming appointments, etc.  Non-urgent messages can be sent to your provider as well.   ?To learn more about what you can do with MyChart, go to [email or web address].   ? ?Your next appointment:   ?6 month(s) ? ?The format for your next appointment:   ?In Person ? ?Provider:   ?Robbie Lis, PA-C or Richardson Dopp, PA-C ?

## 2022-01-20 ENCOUNTER — Telehealth: Payer: Self-pay | Admitting: Pharmacist

## 2022-01-20 DIAGNOSIS — I10 Essential (primary) hypertension: Secondary | ICD-10-CM

## 2022-01-20 NOTE — Telephone Encounter (Signed)
Patient called and LVM stating that he is having severe swelling in his feet that he thinks is from his amlodipine. Requests a call back.  Called pt who reports that he noticed some swelling when he started amlodipine, but it has gotten worse recently. He does elevate his feet. Swelling is only minimally better in the AM. BP has been 123-133/70-74. HR in the 60's. He is on amlodipine '10mg'$  daily. I have asked him to hold amlodipine until Sunday and then resume taking at '5mg'$  daily on Sunday. I advised patient to continue to monitor BP. May fluctuate some after holding for a few days. Asked patient to call us in 10-14 days with BP readings. He is to call if swelling does not improve.

## 2022-01-29 ENCOUNTER — Telehealth: Payer: Self-pay | Admitting: Pharmacist

## 2022-01-29 DIAGNOSIS — I10 Essential (primary) hypertension: Secondary | ICD-10-CM

## 2022-01-29 MED ORDER — SPIRONOLACTONE 25 MG PO TABS: 12.5000 mg | ORAL_TABLET | Freq: Every day | ORAL | 11 refills | Status: DC

## 2022-01-29 NOTE — Telephone Encounter (Signed)
Pt called clinic and left message that swelling has now improved on lower dose of amlodipine but his BP is running high.  Also takes losartan '50mg'$  daily and carvedilol 6.'25mg'$  BID. Previously experienced increase in SCr from 1.79 to 2.18 after his losartan was increased to '100mg'$  daily. Cannot increase losartan dose due to this. HR also already runs in the low 60s so unable to increase his carvedilol dose. Also experienced worsening renal function on chlorthalidone in the past.  Called pt to discuss. BP up to 150-160s/80, HR right at 60. Will start low dose spironolactone 12.'5mg'$  daily. Pt aware to monitor BP at home. Will recheck BMET in 2 weeks. Pt appreciative for the call back.

## 2022-02-13 ENCOUNTER — Other Ambulatory Visit: Payer: Self-pay | Admitting: Cardiovascular Disease

## 2022-02-15 ENCOUNTER — Other Ambulatory Visit: Payer: Medicare HMO

## 2022-02-15 DIAGNOSIS — I10 Essential (primary) hypertension: Secondary | ICD-10-CM

## 2022-02-15 LAB — BASIC METABOLIC PANEL
BUN/Creatinine Ratio: 19 (ref 10–24)
BUN: 30 mg/dL — ABNORMAL HIGH (ref 8–27)
CO2: 24 mmol/L (ref 20–29)
Calcium: 9.6 mg/dL (ref 8.6–10.2)
Chloride: 102 mmol/L (ref 96–106)
Creatinine, Ser: 1.55 mg/dL — ABNORMAL HIGH (ref 0.76–1.27)
Glucose: 148 mg/dL — ABNORMAL HIGH (ref 70–99)
Potassium: 4.5 mmol/L (ref 3.5–5.2)
Sodium: 138 mmol/L (ref 134–144)
eGFR: 45 mL/min/{1.73_m2} — ABNORMAL LOW (ref >59)

## 2022-02-16 ENCOUNTER — Telehealth: Payer: Self-pay | Admitting: Pharmacist

## 2022-02-16 DIAGNOSIS — I10 Essential (primary) hypertension: Secondary | ICD-10-CM

## 2022-02-16 MED ORDER — AMLODIPINE BESYLATE 2.5 MG PO TABS: 2.5000 mg | ORAL_TABLET | Freq: Every day | ORAL | 5 refills | Status: DC

## 2022-02-16 MED ORDER — SPIRONOLACTONE 25 MG PO TABS: 25.0000 mg | ORAL_TABLET | Freq: Every day | ORAL | 11 refills | Status: DC

## 2022-02-16 NOTE — Telephone Encounter (Signed)
BMET stable after starting low dose spironolactone. Pt reports tolerating med well. BP well controlled, typically 120s/60s. Still has some swelling in his ankles and feet. Will decrease amlodipine from '5mg'$  to 2.'5mg'$  daily and increase spironolactone from 12.'5mg'$  to '25mg'$  daily. Repeat BMET in 2 weeks. Pt will monitor BP and swelling in the mean time.

## 2022-03-03 ENCOUNTER — Other Ambulatory Visit: Payer: Medicare HMO | Admitting: *Deleted

## 2022-03-03 DIAGNOSIS — I10 Essential (primary) hypertension: Secondary | ICD-10-CM

## 2022-03-04 ENCOUNTER — Telehealth: Payer: Self-pay | Admitting: Pharmacist

## 2022-03-04 LAB — BASIC METABOLIC PANEL
BUN/Creatinine Ratio: 22 (ref 10–24)
BUN: 32 mg/dL — ABNORMAL HIGH (ref 8–27)
CO2: 25 mmol/L (ref 20–29)
Calcium: 9 mg/dL (ref 8.6–10.2)
Chloride: 103 mmol/L (ref 96–106)
Creatinine, Ser: 1.47 mg/dL — ABNORMAL HIGH (ref 0.76–1.27)
Glucose: 151 mg/dL — ABNORMAL HIGH (ref 70–99)
Potassium: 4.6 mmol/L (ref 3.5–5.2)
Sodium: 139 mmol/L (ref 134–144)
eGFR: 48 mL/min/{1.73_m2} — ABNORMAL LOW (ref >59)

## 2022-03-04 NOTE — Telephone Encounter (Signed)
BMET stable after increasing spironolactone from 12.'5mg'$  to '25mg'$  daily and decreasing amlodipine from '5mg'$  to 2.'5mg'$  daily. Pt reports swelling is down and BP today was great - 128/69. Highest he's seen in a month is 141/74, everything else 130 and below. Will continue current meds.

## 2022-03-10 ENCOUNTER — Other Ambulatory Visit: Payer: Self-pay

## 2022-03-10 MED ORDER — SPIRONOLACTONE 25 MG PO TABS: 25.0000 mg | ORAL_TABLET | Freq: Every day | ORAL | 2 refills | Status: DC

## 2022-03-18 ENCOUNTER — Ambulatory Visit (HOSPITAL_COMMUNITY)
Admission: RE | Admit: 2022-03-18 | Discharge: 2022-03-18 | Disposition: A | Discharge status: Home / Self Care | Payer: Medicare HMO | Source: Ambulatory Visit | Attending: Internal Medicine | Admitting: Internal Medicine

## 2022-03-18 DIAGNOSIS — I6523 Occlusion and stenosis of bilateral carotid arteries: Secondary | ICD-10-CM | POA: Diagnosis present

## 2022-03-19 ENCOUNTER — Telehealth: Payer: Self-pay

## 2022-03-19 DIAGNOSIS — I6523 Occlusion and stenosis of bilateral carotid arteries: Secondary | ICD-10-CM

## 2022-03-19 NOTE — Telephone Encounter (Signed)
-----   Message from Thayer Headings, MD sent at 03/19/2022  2:19 PM EDT ----- Right carotid artery-moderate carotid plaque Left carotid artery-minimal plaque  Repeat in 1 year.

## 2022-03-19 NOTE — Telephone Encounter (Signed)
Patient has viewed results and physician's interpretation via Mychart. Repeat order placed at this time (to be done in 1 year).

## 2022-05-17 ENCOUNTER — Other Ambulatory Visit: Payer: Self-pay | Admitting: *Deleted

## 2022-05-17 DIAGNOSIS — I10 Essential (primary) hypertension: Secondary | ICD-10-CM

## 2022-05-17 MED ORDER — AMLODIPINE BESYLATE 2.5 MG PO TABS: 2.5000 mg | ORAL_TABLET | Freq: Every day | ORAL | 1 refills | Status: DC

## 2022-05-19 ENCOUNTER — Other Ambulatory Visit: Payer: Self-pay | Admitting: Cardiovascular Disease

## 2022-05-19 DIAGNOSIS — I10 Essential (primary) hypertension: Secondary | ICD-10-CM

## 2022-06-11 ENCOUNTER — Encounter: Payer: Self-pay | Admitting: Cardiovascular Disease

## 2022-06-11 ENCOUNTER — Ambulatory Visit: Payer: Medicare HMO | Attending: Cardiovascular Disease | Admitting: Cardiovascular Disease

## 2022-06-11 VITALS — BP 136/70 | HR 68 | Ht 69.0 in | Wt 160.0 lb

## 2022-06-11 DIAGNOSIS — I10 Essential (primary) hypertension: Secondary | ICD-10-CM | POA: Diagnosis not present

## 2022-06-11 NOTE — Patient Instructions (Signed)
Medication Instructions:  Your physician recommends that you continue on your current medications as directed. Please refer to the Current Medication list given to you today.  *If you need a refill on your cardiac medications before your next appointment, please call your pharmacy*   Lab Work: NONE If you have labs (blood work) drawn today and your tests are completely normal, you will receive your results only by: [place] (if you have MyChart) OR A paper copy in the mail If you have any lab test that is abnormal or we need to change your treatment, we will call you to review the results.   Testing/Procedures: NONE   Follow-Up: At [facility] you and your health needs are our priority.  As part of our continuing mission to provide you with exceptional heart care, we have created designated Provider Care Teams.  These Care Teams include your primary Cardiologist (physician) and Advanced Practice Providers (APPs -  Physician Assistants and Nurse Practitioners) who all work together to provide you with the care you need, when you need it.  Your next appointment:   6 month(s)  The format for your next appointment:   In Person  Provider:   Christen Bame, NP    Important Information About Sugar

## 2022-06-11 NOTE — Progress Notes (Signed)
Cardiology Office Note   Date:  06/11/2022   ID:  Omar Sawyer, DOB 1941/02/15, MRN [id number]  PCP:  Everardo Beals, NP  Cardiologist:   Mertie Moores, MD   Chief Complaint  Patient presents with   Hypertension   Hyperlipidemia    Problem List: 1. Hypertension 2. 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 He's the son of a previous patient of mine. [name].  He presents today for continued management of his hypertension.  He avoids salt.  He does eat out quite a bit.  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    He denies any chest pain or dyspnea with exertion.    September 28, 2012: He was started on HCTZ and his BP has been better.  He is having some prostate issues.  He denies any CP, syncope or presyncope.  04/15/2014:  He has been treated for tonsilar cancer ( radiation and chemotherapy)   He's not having any chest pain. He denies any dyspnea. He denies syncope.   Feb. 17, 2016:  Omar Sawyer is a 81 y.o. male who presents for follow up of his HTN. Omar Sawyer is doing well.  BP at home has been well controlled.  He brought his blood pressure log with him today and all his blood pressure readings are in the normal range.Marland Kitchen He's not having any episodes of chest pain or shortness of breath.  He's not exercising as much as he should. He's not having any difficulty doing any of his normal activities.  Feb. 16, 2017:  Doing great.  Has some left sided pain ( occurred after pressure washing his house.   Feb. 26, 2018:  Doing well from a cardiac standpoint HR has remained slow ( he is on Coreg 12. 5 BID )   December 01, 2017: BP readings are good at home. No CP or dyspnea.   No passing out or dyspnea.  Recent labs look great   Feb. 11, 2020 : Omar Sawyer seen today for follow-up visit.  His blood pressure is  elevated today. Has been elevated.   We started Losartan 50 mg and his BP has been better controlled.   Sept. 11, 2020 Omar Sawyer is seen today for follow up of HTN and HLD   Exercising regularly . Walks 3-4 times a week for 20 minutes.  No CP or dyspnea with walking Brought his BP log.  HR and BP look great   Sept. 13, 2021:  Omar Sawyer is seen today for follow up of his HTN, Has stable carotid disease  BP log from home shows well controlled BP for the most part ( its a bit elevated today )  No CP or dyspnea.   Still working some  May 13, 2021: Omar Sawyer seen today for follow-up of his hypertension and carotid artery disease. BP  has been running high  Has been eating some extra salt  Occasionally get exercise  Labs from 9/6 look good LDL is 65.   November 12, 2021:  Seen for HTN We started chlorthalidone but he had renal insufficiency  Omar Sawyer started amlodipine in its place.  Brought his home BP log.  His readings look great   On amlopidine  10  Has a sensation of tightness in his L leg , goes away by morning  May be due to some  fluid retention due to amlodpine  Still eating processed meats ( lunch meat ) regularly  He will reduce his intake of salty meats  No CP Not much exercise , knows he needs to walk more    labs from Dec. 22 were reviewed. Total cholesterol is 149 HDL is 43 LDL is 88 Triglyceride level is 97   Oct. 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/12   Right Level  IIa abd Level III Lymph Nodes   Mixed hyperlipidemia    Mucositis 12/04/2012   Neuromuscular disorder (HCC)    b/l tremors,    Personal history of adenomatous colonic polyps 07/23/2012   2010   Renal insufficiency 12/16/2012   Renal insufficiency 12/16/2012   S/P radiation therapy 11/13/2012-12/29/2012     Right tonsil/ bilateral neck/ total dose 70 Gy in 35 fractions   Status post chemotherapy    concurrent chemo with q3 weeks Cisplatin and daily radiation between 11/13/2012 and 12/29/2012   Thrush, oral 05/15/2013   Unspecified sinusitis (chronic)     Past Surgical History:  Procedure Laterality Date   Biopsy of Right Tonsil Right 10/03/2012   Squamous Cell Carcinoma      BLEPHAROPLASTY     CATARACT EXTRACTION, BILATERAL     COLONOSCOPY  2011   polyps. Dr. Carlean Purl.  Due now 2013   POLYPECTOMY       Current Outpatient Medications  Medication Sig Dispense Refill   amLODipine (NORVASC) 2.5 MG tablet Take 1 tablet (2.5 mg total) by mouth daily. 90 tablet 1   aspirin 81 MG tablet Take 81 mg by mouth daily.     carvedilol (COREG) 6.25 MG tablet TAKE 1 TABLET TWICE DAILY 180 tablet 2   latanoprost (XALATAN) 0.005 % ophthalmic solution 1 drop at bedtime.     losartan (COZAAR) 50 MG tablet Take 1 tablet (50 mg total) by mouth daily. Please call [phone] to schedule an appointment for future refills. Thank you. 1st attempt. 90 tablet 3   Misc Natural Products (ELDERBERRY ZINC/VIT C/IMMUNE MT)      Multiple Vitamin (MULTIVITAMIN) tablet Take 1 tablet by mouth daily.     omeprazole (PRILOSEC) 20 MG capsule Take 20 mg by mouth daily.      Polyethyl Glycol-Propyl Glycol 0.4-0.3 % SOLN Place 1 drop into both eyes in the morning.     sodium fluoride (FLUORISHIELD) 1.1 % GEL dental gel sodium fluoride 1.1 % dental paste     spironolactone (ALDACTONE) 25 MG tablet Take 1 tablet (25 mg total) by mouth daily. 90 tablet 2   tamsulosin (FLOMAX) 0.4 MG CAPS Take 0.4 mg by mouth daily.     No current  facility-administered medications for this visit.    Allergies:   Hydrocodone-acetaminophen and Hydrocodone    Social History:  The patient  reports that he has never smoked. He has never  used smokeless tobacco. He reports that he does not drink alcohol and does not use drugs.   Family History:  The patient's family history includes Breast cancer in his sister; Cancer in his sister; Diabetes in his mother; Heart attack in an other family member; Heart disease in his father and mother; Hyperlipidemia in his mother; Hypertension in his brother, father, and mother.    ROS: Noted in current history.  Otherwise review of systems is negative.   Physical Exam: Blood pressure 136/70, pulse 68, height '5\' 9"'$  (1.753 m), weight 160 lb (72.6 kg), SpO2 98 %.  GEN:  elderly male,   in no acute distress HEENT: Normal NECK: neck is stiff from radiation ,  Bilat carotid bruits  LYMPHATICS: No lymphadenopathy CARDIAC: RRR   RESPIRATORY:  Clear to auscultation without rales, wheezing or rhonchi  ABDOMEN: Soft, non-tender, non-distended MUSCULOSKELETAL:  No edema; No deformity  SKIN: Warm and dry NEUROLOGIC:  Alert and oriented x 3   EKG:    Oct. 13, 2023:   NSR with occas PACs    Recent Labs: 03/03/2022: BUN 32; Creatinine, Ser 1.47; Potassium 4.6; Sodium 139    Lipid Panel    Component Value Date/Time   CHOL 149 08/14/2021 0845   TRIG 97 08/14/2021 0845   HDL 43 08/14/2021 0845   CHOLHDL 3.5 08/14/2021 0845   CHOLHDL 4 01/21/2015 0828   VLDL 24.2 01/21/2015 0828   LDLCALC 88 08/14/2021 0845   LDLDIRECT 70.0 10/17/2014 0838      Wt Readings from Last 3 Encounters:  06/11/22 160 lb (72.6 kg)  11/12/21 165 lb 3.2 oz (74.9 kg)  05/13/21 160 lb (72.6 kg)      Other studies Reviewed: Additional studies/ records that were reviewed today include: . Review of the above records demonstrates:    ASSESSMENT AND PLAN:  Problem List: 1. Hypertension-    BP is better controlled.   Will  have him see Omar Sawyer , NP in 6 months   2. Hyperlipidemia -   labs have been stable   3. Tonsilar cancer ( radiation and chemotherapy)  -    4.  Carotid bruit -   stable ,      Current medicines are reviewed at length with the patient today.  The patient does not have concerns regarding medicines.  The following changes have been made:  no change   Disposition:   FU with me in  1 year     Signed, Mertie Moores, MD  06/11/2022 3:56 PM    [address], [place], [place]  [address] Phone: [phone]; Fax: [phone]

## 2022-08-09 ENCOUNTER — Other Ambulatory Visit: Payer: Self-pay | Admitting: Cardiovascular Disease

## 2022-08-09 DIAGNOSIS — I10 Essential (primary) hypertension: Secondary | ICD-10-CM

## 2022-10-12 ENCOUNTER — Other Ambulatory Visit: Payer: Self-pay | Admitting: *Deleted

## 2022-10-12 DIAGNOSIS — N6320 Unspecified lump in the left breast, unspecified quadrant: Secondary | ICD-10-CM

## 2022-10-20 ENCOUNTER — Other Ambulatory Visit: Payer: Self-pay | Admitting: Cardiovascular Disease

## 2022-10-29 ENCOUNTER — Ambulatory Visit
Admission: RE | Admit: 2022-10-29 | Discharge: 2022-10-29 | Disposition: A | Discharge status: Home / Self Care | Payer: Medicare HMO | Source: Ambulatory Visit | Attending: *Deleted | Admitting: *Deleted

## 2022-10-29 ENCOUNTER — Ambulatory Visit: Payer: Medicare HMO

## 2022-10-29 DIAGNOSIS — N6320 Unspecified lump in the left breast, unspecified quadrant: Secondary | ICD-10-CM

## 2022-12-05 ENCOUNTER — Other Ambulatory Visit: Payer: Self-pay | Admitting: Cardiovascular Disease

## 2022-12-06 NOTE — Telephone Encounter (Signed)
Rx refill sent to pharmacy. 

## 2022-12-14 ENCOUNTER — Encounter: Payer: Self-pay | Admitting: Nurse Practitioner

## 2022-12-14 ENCOUNTER — Ambulatory Visit: Payer: Medicare HMO | Attending: Nurse Practitioner | Admitting: Nurse Practitioner

## 2022-12-14 VITALS — BP 130/60 | HR 67 | Ht 69.0 in | Wt 166.2 lb

## 2022-12-14 DIAGNOSIS — I6523 Occlusion and stenosis of bilateral carotid arteries: Secondary | ICD-10-CM

## 2022-12-14 DIAGNOSIS — I10 Essential (primary) hypertension: Secondary | ICD-10-CM | POA: Diagnosis not present

## 2022-12-14 DIAGNOSIS — E782 Mixed hyperlipidemia: Secondary | ICD-10-CM

## 2022-12-14 NOTE — Patient Instructions (Signed)
Medication Instructions:   Your physician recommends that you continue on your current medications as directed. Please refer to the Current Medication list given to you today.   *If you need a refill on your cardiac medications before your next appointment, please call your pharmacy*   Lab Work:  TODAY!!!! CMET/LIPID/CBC  If you have labs (blood work) drawn today and your tests are completely normal, you will receive your results only by: MyChart Message (if you have MyChart) OR A paper copy in the mail If you have any lab test that is abnormal or we need to change your treatment, we will call you to review the results.   Testing/Procedures:  None ordered.   Follow-Up: At [facility], you and your health needs are our priority.  As part of our continuing mission to provide you with exceptional heart care, we have created designated Provider Care Teams.  These Care Teams include your primary Cardiologist (physician) and Advanced Practice Providers (APPs -  Physician Assistants and Nurse Practitioners) who all work together to provide you with the care you need, when you need it.  We recommend signing up for the patient portal called "MyChart".  Sign up information is provided on this After Visit Summary.  MyChart is used to connect with patients for Virtual Visits (Telemedicine).  Patients are able to view lab/test results, encounter notes, upcoming appointments, etc.  Non-urgent messages can be sent to your provider as well.   To learn more about what you can do with MyChart, go to [email or web address].    Your next appointment:   6 month(s)  Provider:   Kristeen Miss, MD     Other Instructions  Your physician wants you to follow-up in: 6 months with Dr. Elease Hashimoto.  You will receive a reminder letter in the mail two months in advance. If you don't receive a letter, please call our office to schedule the follow-up appointment.

## 2022-12-14 NOTE — Progress Notes (Signed)
Cardiology Office Note:    Date:  12/14/2022   ID:  Maureen Ralphs, DOB [birth year], MRN [id number]  PCP:  Marva Panda, NP   [facility] Providers Cardiologist:  Kristeen Miss, MD     Referring MD: Marva Panda, NP   Chief Complaint: Follow-up hypertension  History of Present Illness:    Omar Sawyer is a very pleasant 82 y.o. 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  He has maintained consistent follow-up with Dr. Elease Hashimoto since being referred for management of hypertension.  Last carotid duplex 03/18/2022 revealed moderate plaque RICA, minimal plaque LICA with recommendation to repeat in 1 year  Today, he is here for follow-up. Reports he is doing well. Lives with his wife, says she is doing well. 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 He does not exercise on a consistent basis.  Monitors BP and HR consistently at home with no concerns.  Has no concerns with current medications.  No specific cardiac concerns. He denies chest pain, shortness of breath, lower extremity edema, fatigue, palpitations, melena, hematuria, hemoptysis, diaphoresis, weakness, presyncope, syncope, orthopnea, and PND.  Past Medical History:  Diagnosis Date   Allergy    Anxiety    mild new dx   Arthritis    Basal cell carcinoma    Cataract    removed both eyes   Edema    Essential hypertension, benign    GERD (gastroesophageal reflux disease)    Hearing loss    Hx of prostatitis    Malignant neoplasm of tonsil 10/03/12 bx   Tonsil =positive for p16(HR HPV MARKER)    sQUAMOUS CELL CARCINOMA   Metastasis to lymph nodes Pet Scan 10/26/12   Right Level IIa abd Level III Lymph Nodes   Mixed hyperlipidemia    Mucositis 12/04/2012   Neuromuscular disorder    b/l tremors,    Personal history of adenomatous colonic polyps 07/23/2012   2010   Renal insufficiency 12/16/2012   Renal insufficiency  12/16/2012   S/P radiation therapy 11/13/2012-12/29/2012     Right tonsil/ bilateral neck/ total dose 70 Gy in 35 fractions   Status post chemotherapy    concurrent chemo with q3 weeks Cisplatin and daily radiation between 11/13/2012 and 12/29/2012   Thrush, oral 05/15/2013   Unspecified sinusitis (chronic)     Past Surgical History:  Procedure Laterality Date   Biopsy of Right Tonsil Right 10/03/2012   Squamous Cell Carcinoma      BLEPHAROPLASTY     CATARACT EXTRACTION, BILATERAL     COLONOSCOPY  2011   polyps. Dr. Leone Payor.  Due now 2013   POLYPECTOMY      Current Medications: Current Meds  Medication Sig   amLODipine (NORVASC) 2.5 MG tablet TAKE 1 TABLET BY MOUTH EVERY DAY   aspirin 81 MG tablet Take 81 mg by mouth daily.   carvedilol (COREG) 6.25 MG tablet Take 1 tablet (6.25 mg total) by mouth 2 (two) times daily.   latanoprost (XALATAN) 0.005 % ophthalmic solution 1 drop at bedtime.   losartan (COZAAR) 50 MG tablet Take 1 tablet (50 mg total) by mouth daily. Please call [phone] to schedule an appointment for future refills. Thank you. 1st attempt.   Misc Natural Products (ELDERBERRY ZINC/VIT C/IMMUNE MT)    Multiple Vitamin (MULTIVITAMIN) tablet Take 1 tablet by mouth daily.   omeprazole (PRILOSEC) 20 MG capsule Take 20 mg by mouth daily.    sodium fluoride (  FLUORISHIELD) 1.1 % GEL dental gel sodium fluoride 1.1 % dental paste   spironolactone (ALDACTONE) 25 MG tablet Take 1 tablet (25 mg total) by mouth daily.   tamsulosin (FLOMAX) 0.4 MG CAPS Take 0.4 mg by mouth daily.     Allergies:   Hydrocodone-acetaminophen and Hydrocodone   Social History   Socioeconomic History   Marital status: Married    Spouse name: Not on file   Number of children: 1   Years of education: Not on file   Highest education level: Not on file  Occupational History    Employer: [employer]    Comment: working as a Copywriter, advertising; Lobbyist   Tobacco Use   Smoking  status: Never   Smokeless tobacco: Never  Vaping Use   Vaping Use: Never used  Substance and Sexual Activity   Alcohol use: No   Drug use: No   Sexual activity: Never  Other Topics Concern   Not on file  Social History Narrative   Not on file   Social Determinants of Health   Financial Resource Strain: Not on file  Food Insecurity: Not on file  Transportation Needs: Not on file  Physical Activity: Not on file  Stress: Not on file  Social Connections: Not on file     Family History: The patient's family history includes Breast cancer in his sister; Cancer in his sister; Diabetes in his mother; Heart attack in an other family member; Heart disease in his father and mother; Hyperlipidemia in his mother; Hypertension in his brother, father, and mother. There is no history of Colon cancer, Esophageal cancer, Rectal cancer, or Stomach cancer.  ROS:   Please see the history of present illness.   All other systems reviewed and are negative.  Labs/Other Studies Reviewed:    The following studies were reviewed today:  Carotid Duplex 03/18/22 Summary:  Right Carotid: Velocities in the right ICA are consistent with a 40-59%                 stenosis, high-end of range based on PSV. The ECA appears  >50%                stenosed.   Left Carotid: Velocities in the left ICA are consistent with a 1-39%  stenosis.   Vertebrals: Bilateral vertebral arteries demonstrate antegrade flow.  Subclavians: Normal flow hemodynamics were seen in bilateral subclavian               arteries.   *See table(s) above for measurements and observations.  Suggest follow up study in 12 months.    Recent Labs: 03/03/2022: BUN 32; Creatinine, Ser 1.47; Potassium 4.6; Sodium 139  Recent Lipid Panel    Component Value Date/Time   CHOL 149 08/14/2021 0845   TRIG 97 08/14/2021 0845   HDL 43 08/14/2021 0845   CHOLHDL 3.5 08/14/2021 0845   CHOLHDL 4 01/21/2015 0828   VLDL 24.2 01/21/2015 0828   LDLCALC 88  08/14/2021 0845   LDLDIRECT 70.0 10/17/2014 0838     Risk Assessment/Calculations:           Physical Exam:    VS:  BP 130/60   Pulse 67   Ht  (1.753 m)   Wt 166 lb 3.2 oz (75.4 kg)   SpO2 96%   BMI 24.54 kg/m     Wt Readings from Last 3 Encounters:  12/14/22 166 lb 3.2 oz (75.4 kg)  06/11/22 160 lb (72.6 kg)  11/12/21 165 lb 3.2 oz (74.9 kg)     GEN:  Well nourished, well developed in no acute distress HEENT: Normal NECK: No JVD; soft carotid bruit on right side CARDIAC: RRR, no murmurs, rubs, gallops RESPIRATORY:  Clear to auscultation without rales, wheezing or rhonchi  ABDOMEN: Soft, non-tender, non-distended MUSCULOSKELETAL:  No edema; No deformity. 2+ pedal pulses, equal bilaterally SKIN: Warm and dry NEUROLOGIC:  Alert and oriented x 3 PSYCHIATRIC:  Normal affect   EKG:  EKG is not ordered today.     Diagnoses:    1. Primary hypertension   2. Bilateral carotid artery stenosis   3. Mixed hyperlipidemia    Assessment and Plan:     Hypertension: BP initially elevated but improved on my recheck. Home BP log reviewed, very well controlled. Scr 1.47 on 03/03/22. Will recheck today. No medication changes today.   Carotid artery disease: Right ICA with 40 to 59% stenosis, left ICA 1 to 39% stenosis on carotid duplex 03/18/2022. He is asymptomatic. Has order for repeat carotid duplex due 02/2023.   Mixed hyperlipidemia: LDL 88. We will repeat lipid panel today.      Disposition: 6 months with Dr. Elease Hashimoto  Medication Adjustments/Labs and Tests Ordered: Current medicines are reviewed at length with the patient today.  Concerns regarding medicines are outlined above.  Orders Placed This Encounter  Procedures   CBC   Comp Met (CMET)   Lipid Profile   No orders of the defined types were placed in this encounter.   Patient Instructions  Medication Instructions:   Your physician recommends that you continue on your current medications as directed. Please  refer to the Current Medication list given to you today.   *If you need a refill on your cardiac medications before your next appointment, please call your pharmacy*   Lab Work:  TODAY!!!! CMET/LIPID/CBC  If you have labs (blood work) drawn today and your tests are completely normal, you will receive your results only by: MyChart Message (if you have MyChart) OR A paper copy in the mail If you have any lab test that is abnormal or we need to change your treatment, we will call you to review the results.   Testing/Procedures:  None ordered.   Follow-Up: At [facility], you and your health needs are our priority.  As part of our continuing mission to provide you with exceptional heart care, we have created designated Provider Care Teams.  These Care Teams include your primary Cardiologist (physician) and Advanced Practice Providers (APPs -  Physician Assistants and Nurse Practitioners) who all work together to provide you with the care you need, when you need it.  We recommend signing up for the patient portal called "MyChart".  Sign up information is provided on this After Visit Summary.  MyChart is used to connect with patients for Virtual Visits (Telemedicine).  Patients are able to view lab/test results, encounter notes, upcoming appointments, etc.  Non-urgent messages can be sent to your provider as well.   To learn more about what you can do with MyChart, go to [email or web address].    Your next appointment:   6 month(s)  Provider:   Kristeen Miss, MD     Other Instructions  Your physician wants you to follow-up in: 6 months with Dr. Elease Hashimoto.  You will receive a reminder letter in the mail two months in advance. If you don't receive a letter, please call our office to schedule the follow-up appointment.     Signed, Halbert Jesson,  Zachary George, NP  12/14/2022 1:29 PM    [facility]

## 2022-12-15 ENCOUNTER — Telehealth: Payer: Self-pay | Admitting: *Deleted

## 2022-12-15 DIAGNOSIS — E782 Mixed hyperlipidemia: Secondary | ICD-10-CM

## 2022-12-15 DIAGNOSIS — N289 Disorder of kidney and ureter, unspecified: Secondary | ICD-10-CM

## 2022-12-15 LAB — COMPREHENSIVE METABOLIC PANEL
ALT: 15 IU/L (ref 0–44)
AST: 17 IU/L (ref 0–40)
Albumin/Globulin Ratio: 1.8 (ref 1.2–2.2)
Albumin: 4 g/dL (ref 3.7–4.7)
Alkaline Phosphatase: 90 IU/L (ref 44–121)
BUN/Creatinine Ratio: 17 (ref 10–24)
BUN: 33 mg/dL — ABNORMAL HIGH (ref 8–27)
Bilirubin Total: 0.3 mg/dL (ref 0.0–1.2)
CO2: 21 mmol/L (ref 20–29)
Calcium: 8.9 mg/dL (ref 8.6–10.2)
Chloride: 106 mmol/L (ref 96–106)
Creatinine, Ser: 1.9 mg/dL — ABNORMAL HIGH (ref 0.76–1.27)
Globulin, Total: 2.2 g/dL (ref 1.5–4.5)
Glucose: 103 mg/dL — ABNORMAL HIGH (ref 70–99)
Potassium: 5 mmol/L (ref 3.5–5.2)
Sodium: 142 mmol/L (ref 134–144)
Total Protein: 6.2 g/dL (ref 6.0–8.5)
eGFR: 35 mL/min/{1.73_m2} — ABNORMAL LOW (ref >59)

## 2022-12-15 LAB — LIPID PANEL
Chol/HDL Ratio: 3.5 ratio (ref 0.0–5.0)
Cholesterol, Total: 154 mg/dL (ref 100–199)
HDL: 44 mg/dL (ref >39)
LDL Chol Calc (NIH): 93 mg/dL (ref 0–99)
Triglycerides: 89 mg/dL (ref 0–149)
VLDL Cholesterol Cal: 17 mg/dL (ref 5–40)

## 2022-12-15 LAB — CBC
Hematocrit: 35.1 % — ABNORMAL LOW (ref 37.5–51.0)
Hemoglobin: 11.8 g/dL — ABNORMAL LOW (ref 13.0–17.7)
MCH: 31.7 pg (ref 26.6–33.0)
MCHC: 33.6 g/dL (ref 31.5–35.7)
MCV: 94 fL (ref 79–97)
Platelets: 174 10*3/uL (ref 150–450)
RBC: 3.72 x10E6/uL — ABNORMAL LOW (ref 4.14–5.80)
RDW: 12.3 % (ref 11.6–15.4)
WBC: 6 10*3/uL (ref 3.4–10.8)

## 2022-12-15 NOTE — Telephone Encounter (Signed)
-----   Message from Awilda Metro, RPH-CPP sent at 12/15/2022  1:25 PM EDT ----- PharmD has followed pt for his HTN but not his cholesterol before. Looks like he took simvastatin a decade ago but that's all I can find in his records. Not sure if he mentioned specifically on phone call if he had tried rosuvastatin before? If not, would be worth trying very low dose, could try rosuvastatin  daily if willing or ezetimibe  daily to see if he tolerates better. Otherwise, would have him scheduled with PharmD for lipid visit to discuss other options in detail.

## 2022-12-15 NOTE — Telephone Encounter (Signed)
Pt aware of his lab results.  See result note.

## 2022-12-20 ENCOUNTER — Other Ambulatory Visit: Payer: Self-pay

## 2022-12-20 DIAGNOSIS — I10 Essential (primary) hypertension: Secondary | ICD-10-CM

## 2022-12-20 MED ORDER — AMLODIPINE BESYLATE 2.5 MG PO TABS
2.5000 mg | ORAL_TABLET | Freq: Every day | ORAL | 3 refills | Status: DC
Start: 2022-12-20 — End: 2023-06-28

## 2023-01-07 ENCOUNTER — Ambulatory Visit: Payer: Medicare HMO | Attending: Cardiovascular Disease | Admitting: Pharmacist

## 2023-01-07 DIAGNOSIS — E782 Mixed hyperlipidemia: Secondary | ICD-10-CM

## 2023-01-07 MED ORDER — ROSUVASTATIN CALCIUM 5 MG PO TABS: 5.0000 mg | ORAL_TABLET | Freq: Every day | ORAL | 3 refills | Status: DC

## 2023-01-07 NOTE — Patient Instructions (Addendum)
Your LDL cholesterol is 93 and your goal is < 70  Start rosuvastatin (Crestor) 5mg  once daily  Recheck fasting cholesterol on Monday, July 22nd any time after 7:30am  Call Aundra Millet, PharmD with any tolerability issues and we can try ezetimibe (Zetia) instead - [phone]

## 2023-01-07 NOTE — Progress Notes (Addendum)
Patient ID: Omar Sawyer                 DOB: 06-01-1941                    MRN: [id number]     HPI: Omar Sawyer is a 82 y.o. male patient of Dr Omar Sawyer referred to lipid clinic by Omar Bridegroom, NP. PMH is significant for HTN, HLD, orthostatic hypotension, CAD, and tonsillar cancer treated with chemo and radiation. Last carotid duplex 03/18/2022 showed moderate plaque 40-59% stenosis in the R ICA, minimal plaque 1-39% stenosis in the L ICA with plan to repeat in 1 year. I have followed pt for his blood pressure in the past. His LDL last month was 93 above goal < 70 and he was referred to lipid clinic.  I previously stopped pt's fenofibrate in 2022 due to worsening renal function and normal TG. Pt unsure why his simvastatin was stopped, it's been about a decade since he was prescribed it. Initially opposed to statin therapy due to information he has read about statins. Also saw that his cholesterol all came back in the normal range on his labwork. Discussed that his LDL is in the normal range, but that his specific LDL goal is < 70 given his carotid stenosis and that lower LDL will help to prevent worsening blockages and plaque progression.  He is pleased his BP readings have still been well controlled. Brings in a log of home readings today which look excellent and have been scanned into media tab of Epic.  Current Medications: none Intolerances: simvastatin - not sure why d/c'ed, fenofibrate - stopped due to normal TG and CKD Risk Factors: carotid artery stenosis LDL goal: 70mg /dL  Diet: avoids fried food  Family History: Breast cancer in his sister; Cancer in his sister; Diabetes in his mother; Heart attack in an other family member; Heart disease in his father and mother; Hyperlipidemia in his mother; Hypertension in his brother, father, and mother   Social History: No tobacco, alcohol or drug use.  Labs: 12/14/22: TC 154, TG 89, HDL 44, LDL 93 - no LLT  Past Medical History:   Diagnosis Date   Allergy    Anxiety    mild new dx   Arthritis    Basal cell carcinoma    Cataract    removed both eyes   Edema    Essential hypertension, benign    GERD (gastroesophageal reflux disease)    Hearing loss    Hx of prostatitis    Malignant neoplasm of tonsil (HCC) 10/03/12 bx   Tonsil =positive for p16(HR HPV MARKER)    sQUAMOUS CELL CARCINOMA   Metastasis to lymph nodes (HCC) Pet Scan 10/26/12   Right Level IIa abd Level III Lymph Nodes   Mixed hyperlipidemia    Mucositis 12/04/2012   Neuromuscular disorder (HCC)    b/l tremors,    Personal history of adenomatous colonic polyps 07/23/2012   2010   Renal insufficiency 12/16/2012   Renal insufficiency 12/16/2012   S/P radiation therapy 11/13/2012-12/29/2012     Right tonsil/ bilateral neck/ total dose 70 Gy in 35 fractions   Status post chemotherapy    concurrent chemo with q3 weeks Cisplatin and daily radiation between 11/13/2012 and 12/29/2012   Thrush, oral 05/15/2013   Unspecified sinusitis (chronic)     Current Outpatient Medications on File Prior to Visit  Medication Sig Dispense Refill   amLODipine (NORVASC) 2.5 MG tablet Take 1 tablet (  2.5 mg total) by mouth daily. 90 tablet 3   aspirin 81 MG tablet Take 81 mg by mouth daily.     carvedilol (COREG) 6.25 MG tablet Take 1 tablet (6.25 mg total) by mouth 2 (two) times daily. 180 tablet 1   latanoprost (XALATAN) 0.005 % ophthalmic solution 1 drop at bedtime.     losartan (COZAAR) 50 MG tablet Take 1 tablet (50 mg total) by mouth daily. Please call [phone] to schedule an appointment for future refills. Thank you. 1st attempt. 90 tablet 3   Misc Natural Products (ELDERBERRY ZINC/VIT C/IMMUNE MT)      Multiple Vitamin (MULTIVITAMIN) tablet Take 1 tablet by mouth daily.     omeprazole (PRILOSEC) 20 MG capsule Take 20 mg by mouth daily.      sodium fluoride (FLUORISHIELD) 1.1 % GEL dental gel sodium fluoride 1.1 % dental paste     spironolactone (ALDACTONE) 25 MG  tablet Take 1 tablet (25 mg total) by mouth daily. 90 tablet 2   tamsulosin (FLOMAX) 0.4 MG CAPS Take 0.4 mg by mouth daily.     No current facility-administered medications on file prior to visit.    Allergies  Allergen Reactions   Hydrocodone-Acetaminophen Nausea And Vomiting   Hydrocodone Nausea And Vomiting    Assessment/Plan:  1. Hyperlipidemia - Baseline LDL 93 above goal < 70 due to carotid artery stenosis. Pt initially hesitant to try statin given what he has read about med class. Discussed CV benefit of statin therapy including lower risk of MI, stroke, and plaque progression. He is agreeable to try rosuvastatin 5mg  daily which should bring his LDL to goal. Will recheck labs in 2 months. If pt does not tolerate low dose rosuvastatin, will try ezetimibe 10mg  daily instead.  Omar Sawyer, PharmD, BCACP, CPP  [facility] [address] [place], [place] [address] Phone: [phone]; Fax: [phone] 01/07/2023 9:13 AM

## 2023-01-28 ENCOUNTER — Ambulatory Visit: Payer: Medicare HMO | Admitting: Nurse Practitioner

## 2023-03-06 ENCOUNTER — Other Ambulatory Visit: Payer: Self-pay

## 2023-03-06 ENCOUNTER — Emergency Department (HOSPITAL_COMMUNITY)
Admission: EM | Admit: 2023-03-06 | Discharge: 2023-03-06 | Disposition: A | Discharge status: Home / Self Care | Payer: Medicare HMO | Attending: Emergency Medicine | Admitting: Emergency Medicine

## 2023-03-06 DIAGNOSIS — D72829 Elevated white blood cell count, unspecified: Secondary | ICD-10-CM | POA: Insufficient documentation

## 2023-03-06 DIAGNOSIS — Z79899 Other long term (current) drug therapy: Secondary | ICD-10-CM | POA: Diagnosis not present

## 2023-03-06 DIAGNOSIS — I10 Essential (primary) hypertension: Secondary | ICD-10-CM | POA: Diagnosis not present

## 2023-03-06 DIAGNOSIS — R350 Frequency of micturition: Secondary | ICD-10-CM | POA: Diagnosis present

## 2023-03-06 DIAGNOSIS — N3000 Acute cystitis without hematuria: Secondary | ICD-10-CM | POA: Diagnosis not present

## 2023-03-06 DIAGNOSIS — E871 Hypo-osmolality and hyponatremia: Secondary | ICD-10-CM | POA: Diagnosis not present

## 2023-03-06 DIAGNOSIS — Z7982 Long term (current) use of aspirin: Secondary | ICD-10-CM | POA: Diagnosis not present

## 2023-03-06 LAB — CBC
HCT: 32.2 % — ABNORMAL LOW (ref 39.0–52.0)
Hemoglobin: 10.6 g/dL — ABNORMAL LOW (ref 13.0–17.0)
MCH: 31.3 pg (ref 26.0–34.0)
MCHC: 32.9 g/dL (ref 30.0–36.0)
MCV: 95 fL (ref 80.0–100.0)
Platelets: 155 10*3/uL (ref 150–400)
RBC: 3.39 MIL/uL — ABNORMAL LOW (ref 4.22–5.81)
RDW: 12.2 % (ref 11.5–15.5)
WBC: 16.9 10*3/uL — ABNORMAL HIGH (ref 4.0–10.5)
nRBC: 0 % (ref 0.0–0.2)

## 2023-03-06 LAB — BASIC METABOLIC PANEL
Anion gap: 5 (ref 5–15)
BUN: 24 mg/dL — ABNORMAL HIGH (ref 8–23)
CO2: 25 mmol/L (ref 22–32)
Calcium: 8.5 mg/dL — ABNORMAL LOW (ref 8.9–10.3)
Chloride: 102 mmol/L (ref 98–111)
Creatinine, Ser: 1.75 mg/dL — ABNORMAL HIGH (ref 0.61–1.24)
GFR, Estimated: 38 mL/min — ABNORMAL LOW (ref >60)
Glucose, Bld: 199 mg/dL — ABNORMAL HIGH (ref 70–99)
Potassium: 4.3 mmol/L (ref 3.5–5.1)
Sodium: 132 mmol/L — ABNORMAL LOW (ref 135–145)

## 2023-03-06 LAB — URINALYSIS, ROUTINE W REFLEX MICROSCOPIC
Bilirubin Urine: NEGATIVE
Glucose, UA: NEGATIVE mg/dL
Hgb urine dipstick: NEGATIVE
Ketones, ur: NEGATIVE mg/dL
Nitrite: POSITIVE — AB
Protein, ur: NEGATIVE mg/dL
Specific Gravity, Urine: 1.012 (ref 1.005–1.030)
WBC, UA: >50 WBC/hpf (ref 0–5)
pH: 5 (ref 5.0–8.0)

## 2023-03-06 MED ORDER — CEPHALEXIN 250 MG PO CAPS
1000.0000 mg | ORAL_CAPSULE | Freq: Once | ORAL | Status: AC
  Administered 2023-03-06: 1000 mg via ORAL
  Filled 2023-03-06: qty 4

## 2023-03-06 MED ORDER — CEFDINIR 300 MG PO CAPS: 300.0000 mg | ORAL_CAPSULE | Freq: Two times a day (BID) | ORAL | 0 refills | Status: DC

## 2023-03-06 NOTE — ED Provider Notes (Signed)
[facility] DEPARTMENT AT [facility] Provider Note   CSN: [id number] Arrival date & time: 03/06/23  1429     History  Chief Complaint  Patient presents with   Urinary Frequency    LORAIN WHORTON is a 82 y.o. male with past medical history seen for hypertension, hyperlipidemia, renal insufficiency, he does endorse history of enlarged prostate who presents with concern for frequency, urinary urgency for 2 days with minimal urine output.  Patient reports that he has a sense that he needs to void but has been unable to void anything other than a small amount of urine.  He reports that he continues to feel like he needs to.  He takes Flomax, he reports that he has had 1 episode of acute urinary retention after surgery in the past, but none since then.   Urinary Frequency       Home Medications Prior to Admission medications   Medication Sig Start Date End Date Taking? Authorizing Provider  cefdinir (OMNICEF) 300 MG capsule Take 1 capsule (300 mg total) by mouth 2 (two) times daily. 03/06/23  Yes Adam Demary H, PA-C  amLODipine (NORVASC) 2.5 MG tablet Take 1 tablet (2.5 mg total) by mouth daily. 12/20/22   Nahser, Deloris Ping, MD  aspirin 81 MG tablet Take 81 mg by mouth daily.    [provider]  carvedilol (COREG) 6.25 MG tablet Take 1 tablet (6.25 mg total) by mouth 2 (two) times daily. 12/06/22   Nahser, Deloris Ping, MD  latanoprost (XALATAN) 0.005 % ophthalmic solution 1 drop at bedtime. 10/13/21   [provider]  losartan (COZAAR) 50 MG tablet Take 1 tablet (50 mg total) by mouth daily. Please call [phone] to schedule an appointment for future refills. Thank you. 1st attempt. 05/19/22   Nahser, Deloris Ping, MD  Misc Natural Products (ELDERBERRY ZINC/VIT C/IMMUNE MT)  08/13/20   [provider]  Multiple Vitamin (MULTIVITAMIN) tablet Take 1 tablet by mouth daily.    [provider]  omeprazole (PRILOSEC) 20 MG capsule Take 20 mg  by mouth daily.  04/23/12   [provider]  rosuvastatin (CRESTOR) 5 MG tablet Take 1 tablet (5 mg total) by mouth daily. 01/07/23   Nahser, Deloris Ping, MD  sodium fluoride (FLUORISHIELD) 1.1 % GEL dental gel sodium fluoride 1.1 % dental paste 05/19/20   [provider]  spironolactone (ALDACTONE) 25 MG tablet Take 1 tablet (25 mg total) by mouth daily. 10/20/22   Nahser, Deloris Ping, MD  tamsulosin (FLOMAX) 0.4 MG CAPS Take 0.4 mg by mouth daily.    [provider]      Allergies    Hydrocodone-acetaminophen and Hydrocodone    Review of Systems   Review of Systems  Genitourinary:  Positive for frequency.  All other systems reviewed and are negative.   Physical Exam Updated Vital Signs BP (!) 140/67   Pulse 74   Temp 98.4 F (36.9 C)   Resp 16   Ht 5\' 9"  (1.753 m)   Wt 75.4 kg   SpO2 100%   BMI 24.55 kg/m  Physical Exam Vitals and nursing note reviewed.  Constitutional:      General: He is not in acute distress.    Appearance: Normal appearance.  HENT:     Head: Normocephalic and atraumatic.  Eyes:     General:        Right eye: No discharge.        Left eye: No discharge.  Cardiovascular:  Rate and Rhythm: Normal rate and regular rhythm.     Heart sounds: No murmur heard.    No friction rub. No gallop.  Pulmonary:     Effort: Pulmonary effort is normal.     Breath sounds: Normal breath sounds.  Abdominal:     General: Bowel sounds are normal.     Palpations: Abdomen is soft.     Comments: No significant abdominal distention, mild tenderness palpation suprapubically, no significant flank pain tenderness bilaterally.  Skin:    General: Skin is warm and dry.     Capillary Refill: Capillary refill takes less than 2 seconds.  Neurological:     Mental Status: He is alert and oriented to person, place, and time.  Psychiatric:        Mood and Affect: Mood normal.        Behavior: Behavior normal.     ED Results / Procedures / Treatments    Labs (all labs ordered are listed, but only abnormal results are displayed) Labs Reviewed  URINALYSIS, ROUTINE W REFLEX MICROSCOPIC - Abnormal; Notable for the following components:      Result Value   APPearance CLOUDY (*)    Nitrite POSITIVE (*)    Leukocytes,Ua LARGE (*)    Bacteria, UA MANY (*)    All other components within normal limits  CBC - Abnormal; Notable for the following components:   WBC 16.9 (*)    RBC 3.39 (*)    Hemoglobin 10.6 (*)    HCT 32.2 (*)    All other components within normal limits  BASIC METABOLIC PANEL - Abnormal; Notable for the following components:   Sodium 132 (*)    Glucose, Bld 199 (*)    BUN 24 (*)    Creatinine, Ser 1.75 (*)    Calcium 8.5 (*)    GFR, Estimated 38 (*)    All other components within normal limits  URINE CULTURE    EKG None  Radiology No results found.  Procedures Procedures    Medications Ordered in ED Medications  cephALEXin (KEFLEX) capsule 1,000 mg (1,000 mg Oral Given 03/06/23 1812)    ED Course/ Medical Decision Making/ A&P                             Medical Decision Making Amount and/or Complexity of Data Reviewed Labs: ordered.   This patient is a 82 y.o. male  who presents to the ED for concern of dysuria, urinary urgency, and frequency.   Differential diagnoses prior to evaluation: The emergent differential diagnosis includes, but is not limited to, urinary tract infection, kidney stone, acute urinary retention, prostatitis, interstitial cystitis, versus other. This is not an exhaustive differential.   Past Medical History / Co-morbidities / Social History: Hypertension, hyperlipidemia malignant neoplasm of tonsil not currently undergoing treatment, renal insufficiency  Additional history: Chart reviewed. Pertinent results include: Reviewed outpatient cardiology evaluations, no recent ED evaluations since 2021  Physical Exam: Physical exam performed. The pertinent findings include: Vital  signs are overall stable, moderate hypertension, blood pressure 157/68 on arrival improved to 140/67 without intervention.  Mild tenderness palpation suprapubically, no CVA tenderness, overall in no acute distress.  Lab Tests/Imaging studies: I personally interpreted labs/imaging and the pertinent results include: BMP with mild hyponatremia, sodium 132, glucose 199.  He may have mild elevation of his creatinine at 1.75, BUN of 24, but not significantly changed from his baseline, not representative of AKI.  He  does have a significant leukocytosis, white blood cell 16.9.  His UA is notable for nitrate positive, leukocyte positive UTI with greater than 50 white blood cells and many bacteria.  Medications: I ordered medication including Keflex for UTI, considered fluid bolus, IV antibiotics but had discussion with patient, given his overall well appearance, lack of tachycardia, fever, we opted to treat with oral antibiotics alone at this time.  I have reviewed the patients home medicines and have made adjustments as needed.   Disposition: After consideration of the diagnostic results and the patients response to treatment, I feel that patient is stable for discharge with plan as above to follow-up with urology, will send urine for culture, and discharged with cefdinir for UTI.   emergency department workup does not suggest an emergent condition requiring admission or immediate intervention beyond what has been performed at this time. The plan is: as above. The patient is safe for discharge and has been instructed to return immediately for worsening symptoms, change in symptoms or any other concerns.  Final Clinical Impression(s) / ED Diagnoses Final diagnoses:  Acute cystitis without hematuria    Rx / DC Orders ED Discharge Orders          Ordered    cefdinir (OMNICEF) 300 MG capsule  2 times daily        03/06/23 1800              Kadian Barcellos, Albia H, PA-C 03/06/23 1825     Pricilla Loveless, MD 03/06/23 2140

## 2023-03-06 NOTE — ED Notes (Signed)
70 CC found during bladder scan.

## 2023-03-06 NOTE — Discharge Instructions (Addendum)
Please take the entire course of antibiotics that we have prescribed.  If the antibiotics that we send do not cover for your infection you will receive a call from our pharmacist.  Please follow-up with your urologist as needed if you have any concerns or return the emergency department if you have significantly worsening pain, develop fever, chills.

## 2023-03-06 NOTE — ED Notes (Signed)
Pt has been ambulated to and from RR

## 2023-03-06 NOTE — ED Triage Notes (Signed)
Urinary freq and urgency x 2 days with minimal urine output. Pt states he was up every 20 mins throughout the night. Denies any pain just a discomfort when trying to void.

## 2023-03-08 LAB — URINE CULTURE: Culture: >=100000 — AB

## 2023-03-09 LAB — URINE CULTURE

## 2023-03-10 ENCOUNTER — Telehealth (HOSPITAL_BASED_OUTPATIENT_CLINIC_OR_DEPARTMENT_OTHER): Payer: Self-pay

## 2023-03-10 NOTE — Telephone Encounter (Signed)
Post ED Visit - Positive Culture Follow-up  Culture report reviewed by antimicrobial stewardship pharmacist: [facility] [x]  Francene Finders [place], [place].D. []  Celedonio Miyamoto, Pharm.D., BCPS AQ-ID []  Garvin Fila, Pharm.D., BCPS []  Georgina Pillion, Pharm.D., BCPS []  [place], [address].D., BCPS, AAHIVP []  Estella Husk, Pharm.D., BCPS, AAHIVP []  Lysle Pearl, PharmD, BCPS []  Phillips Climes, PharmD, BCPS []  Agapito Games, PharmD, BCPS []  Verlan Friends, PharmD []  Mervyn Gay, PharmD, BCPS []  Vinnie Level, PharmD  [facility] []  Len Childs, PharmD []  Greer Pickerel, PharmD []  Adalberto Cole, PharmD []  Perlie Gold, Rph []  Lonell Face) Jean Rosenthal, PharmD []  Earl Many, PharmD []  Junita Push, PharmD []  Dorna Leitz, PharmD []  Terrilee Files, PharmD []  Lynann Beaver, PharmD []  Keturah Barre, PharmD []  Loralee Pacas, PharmD []  Bernadene Person, PharmD   Positive urine culture Treated with Cefdinir, organism sensitive to the same and no further patient follow-up is required at this time.  Sandria Senter 03/10/2023, 9:56 AM

## 2023-03-15 ENCOUNTER — Other Ambulatory Visit (HOSPITAL_COMMUNITY): Payer: Self-pay | Admitting: Cardiovascular Disease

## 2023-03-15 DIAGNOSIS — I6523 Occlusion and stenosis of bilateral carotid arteries: Secondary | ICD-10-CM

## 2023-03-21 ENCOUNTER — Ambulatory Visit: Payer: Medicare HMO

## 2023-03-21 ENCOUNTER — Ambulatory Visit (HOSPITAL_COMMUNITY)
Admission: RE | Admit: 2023-03-21 | Discharge: 2023-03-21 | Disposition: A | Discharge status: Home / Self Care | Payer: Medicare HMO | Source: Ambulatory Visit | Attending: Cardiology | Admitting: Cardiology

## 2023-03-21 DIAGNOSIS — E782 Mixed hyperlipidemia: Secondary | ICD-10-CM

## 2023-03-21 DIAGNOSIS — I6523 Occlusion and stenosis of bilateral carotid arteries: Secondary | ICD-10-CM | POA: Insufficient documentation

## 2023-03-22 ENCOUNTER — Telehealth: Payer: Self-pay

## 2023-03-22 DIAGNOSIS — I6523 Occlusion and stenosis of bilateral carotid arteries: Secondary | ICD-10-CM

## 2023-03-22 LAB — LIPID PANEL
Chol/HDL Ratio: 2.4 ratio (ref 0.0–5.0)
Cholesterol, Total: 80 mg/dL — ABNORMAL LOW (ref 100–199)
HDL: 33 mg/dL — ABNORMAL LOW (ref >39)
LDL Chol Calc (NIH): 28 mg/dL (ref 0–99)
Triglycerides: 96 mg/dL (ref 0–149)
VLDL Cholesterol Cal: 19 mg/dL (ref 5–40)

## 2023-03-22 LAB — HEPATIC FUNCTION PANEL
ALT: 50 IU/L — ABNORMAL HIGH (ref 0–44)
AST: 29 IU/L (ref 0–40)
Albumin: 4.1 g/dL (ref 3.7–4.7)
Alkaline Phosphatase: 84 IU/L (ref 44–121)
Bilirubin Total: 0.4 mg/dL (ref 0.0–1.2)
Bilirubin, Direct: 0.14 mg/dL (ref 0.00–0.40)
Total Protein: 6.1 g/dL (ref 6.0–8.5)

## 2023-03-22 NOTE — Telephone Encounter (Signed)
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 Mild stenosis in left carotid artery  Repeat study in 1 year .

## 2023-03-22 NOTE — Telephone Encounter (Signed)
Called to speak with patient, verbalized understanding of plan. Repeat Carotid scan ordered. He mentions that he is experiencing dizziness/lightheadedness when changing positions from laying to standing or sitting to standing. He was seen in ED recently for acute cystitis BP log: 7/23  106/60,  116/56 7/22 99/55 7/19 133/55 7/18 81/49 (AM) 105/62 (9AM), 109/57 (12:15pm)  **Spoke with Dr Elease Hashimoto during clinic who advised pt to hold Amlodipine and Spironolactone and ensure proper fluid intake. Pt provided with this recommendation and verbalized understanding. He understands to call us back if this doesn't stabilize his pressure and knows to resume taking should his pressure start trending up. Could be infection at play or poor po intake. Pt will continue to monitor and call if needed.

## 2023-05-01 ENCOUNTER — Other Ambulatory Visit: Payer: Self-pay | Admitting: Cardiovascular Disease

## 2023-05-26 ENCOUNTER — Other Ambulatory Visit: Payer: Self-pay | Admitting: Cardiovascular Disease

## 2023-05-26 DIAGNOSIS — I10 Essential (primary) hypertension: Secondary | ICD-10-CM

## 2023-06-27 ENCOUNTER — Encounter: Payer: Self-pay | Admitting: Cardiovascular Disease

## 2023-06-27 NOTE — Progress Notes (Unsigned)
Cardiology Office Note   Date:  06/28/2023   ID:  Omar Sawyer, DOB Dec 09, 1940, MRN [id number]  PCP:  Omar Confer, NP  Cardiologist:   Omar Miss, MD   Chief Complaint  Patient presents with   Hypertension        Hyperlipidemia    Problem List: 1. Hypertension 2. 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 He's the son of a previous patient of mine. ( Omar Sawyer).  He presents today for continued management of his hypertension.  He avoids salt.  He does eat out quite a bit.  He is still working Retail banker).  He does not get any regular exercise.    He denies any chest pain or dyspnea with exertion.    September 28, 2012: He was started on HCTZ and his BP has been better.  He is having some prostate issues.  He denies any CP, syncope or presyncope.  04/15/2014:  He has been treated for tonsilar cancer ( radiation and chemotherapy)   He's not having any chest pain. He denies any dyspnea. He denies syncope.   Feb. 17, 2016:  Omar Sawyer is a 82 y.o. male who presents for follow up of his HTN. Omar Sawyer is doing well.  BP at home has been well controlled.  He brought his blood pressure log with him today and all his blood pressure readings are in the normal range.Marland Kitchen He's not having any episodes of chest pain or shortness of breath.  He's not exercising as much as he should. He's not having any difficulty doing any of his normal activities.  Feb. 16, 2017:  Doing great.  Has some left sided pain ( occurred after pressure washing his house.   Feb. 26, 2018:  Doing well from a cardiac standpoint HR has remained slow ( he is on Coreg 12. 5 BID )   December 01, 2017: BP readings are good at home. No CP or dyspnea.   No passing out or dyspnea.  Recent labs look great   Feb. 11, 2020 : Omar Sawyer seen today for follow-up visit.  His blood pressure  is elevated today. Has been elevated.   We started Losartan 50 mg and his BP has been better controlled.   Sept. 11, 2020 Omar Sawyer is seen today for follow up of HTN and HLD   Exercising regularly . Walks 3-4 times a week for 20 minutes.  No CP or dyspnea with walking Brought his BP log.  HR and BP look great   Sept. 13, 2021:  Omar Sawyer is seen today for follow up of his HTN, Has stable carotid disease  BP log from home shows well controlled BP for the most part ( its a bit elevated today )  No CP or dyspnea.   Still working some  May 13, 2021: Omar Sawyer seen today for follow-up of his hypertension and carotid artery disease. BP  has been running high  Has been eating some extra salt  Occasionally get exercise  Labs from 9/6 look good LDL is 65.   November 12, 2021:  Seen for HTN We started chlorthalidone but he had renal insufficiency  Omar Sawyer started amlodipine in its place.  Brought his home BP log.  His readings look great   On amlopidine  10  Has a sensation of tightness in his L leg , goes away by morning  May be due to some fluid retention due to amlodpine  Still eating processed meats ( lunch meat ) regularly  He will reduce his intake of salty meats  No CP Not much exercise , knows he needs to walk more    labs from Dec. 22 were reviewed. Total cholesterol is 149 HDL is 43 LDL is 88 Triglyceride level is 97   Oct. 13, 2023  Omar Sawyer is dooing well Having some orthostatic hypotension  Otherwise, no cp,  June 28, 2023: Omar Sawyer is seen today for follow-up of his hyperlipidemia, hypertension. Provide his blood pressure log.  All of his readings are within normal limits.  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 10/26/12   Right Level IIa abd Level III Lymph Nodes   Mixed hyperlipidemia    Mucositis 12/04/2012   Neuromuscular disorder (HCC)    b/l tremors,    Personal history of adenomatous colonic polyps 07/23/2012   2010   Renal insufficiency 12/16/2012   Renal insufficiency 12/16/2012   S/P radiation therapy 11/13/2012-12/29/2012     Right tonsil/ bilateral neck/ total dose 70 Gy in 35 fractions   Status post chemotherapy    concurrent chemo with q3 weeks Cisplatin and daily radiation between 11/13/2012 and 12/29/2012   Thrush, oral 05/15/2013   Unspecified sinusitis (chronic)     Past Surgical History:  Procedure Laterality Date   Biopsy of Right Tonsil Right 10/03/2012   Squamous Cell Carcinoma      BLEPHAROPLASTY     CATARACT EXTRACTION, BILATERAL     COLONOSCOPY  2011   polyps. Dr. Leone Sawyer.  Due now 2013   POLYPECTOMY       Current Outpatient Medications  Medication Sig Dispense Refill   aspirin 81 MG tablet Take 81 mg by mouth daily.     carvedilol (COREG) 6.25 MG tablet TAKE 1 TABLET TWICE DAILY 180 tablet 3   latanoprost (XALATAN) 0.005 % ophthalmic solution 1 drop at bedtime.     losartan (COZAAR) 50 MG tablet Take 1 tablet (50 mg total) by mouth daily. 90 tablet 1   Misc Natural Products (ELDERBERRY ZINC/VIT C/IMMUNE MT)      Multiple Vitamin (MULTIVITAMIN) tablet Take 1 tablet by mouth daily.     omeprazole (PRILOSEC) 20 MG capsule Take 20 mg by mouth daily.      rosuvastatin (CRESTOR) 5 MG tablet Take 1 tablet (5 mg total) by mouth daily. 90 tablet 3   sodium fluoride (FLUORISHIELD) 1.1 % GEL dental gel sodium fluoride 1.1 % dental paste     tamsulosin (FLOMAX) 0.4 MG CAPS Take 0.4 mg by mouth daily.     timolol (TIMOPTIC) 0.25 % ophthalmic  solution Place 1 drop into both eyes daily.     No current facility-administered medications for this visit.    Allergies:   Hydrocodone-acetaminophen and Hydrocodone  Social History:  The patient  reports that he has never smoked. He has never used smokeless tobacco. He reports that he does not drink alcohol and does not use drugs.   Family History:  The patient's family history includes Breast cancer in his sister; Cancer in his sister; Diabetes in his mother; Heart attack in an other family member; Heart disease in his father and mother; Hyperlipidemia in his mother; Hypertension in his brother, father, and mother.    ROS: Noted in current history.  Otherwise review of systems is negative.  Physical Exam: Blood pressure 136/82, pulse 67, height 5\' 9"  (1.753 m), weight 160 lb 6.4 oz (72.8 kg), SpO2 98%.       GEN:  Well nourished, well developed in no acute distress HEENT: Normal NECK: radiation changes LYMPHATICS: No lymphadenopathy CARDIAC: RRR   RESPIRATORY:  Clear to auscultation without rales, wheezing or rhonchi  ABDOMEN: Soft, non-tender, non-distended MUSCULOSKELETAL:  No edema; No deformity  SKIN: Warm and dry NEUROLOGIC:  Alert and oriented x 3   EKG:     EKG Interpretation Date/Time:  Tuesday June 28 2023 08:29:50 EDT Ventricular Rate:  62 PR Interval:  174 QRS Duration:  74 QT Interval:  394 QTC Calculation: 399 R Axis:   57  Text Interpretation: Normal sinus rhythm Cannot rule out Anterior infarct (cited on or before 01-May-2008) When compared with ECG of 01-May-2008 19:01, PR interval has decreased QRS duration has decreased Inverted T waves have replaced nonspecific T wave abnormality in Inferior leads Confirmed by Omar Sawyer ([id number]) on 06/28/2023 8:43:45 AM     Recent Labs: 03/06/2023: BUN 24; Creatinine, Ser 1.75; Hemoglobin 10.6; Platelets 155; Potassium 4.3; Sodium 132 03/21/2023: ALT 50    Lipid Panel    Component Value Date/Time    CHOL 80 (L) 03/21/2023 0811   TRIG 96 03/21/2023 0811   HDL 33 (L) 03/21/2023 0811   CHOLHDL 2.4 03/21/2023 0811   CHOLHDL 4 01/21/2015 0828   VLDL 24.2 01/21/2015 0828   LDLCALC 28 03/21/2023 0811   LDLDIRECT 70.0 10/17/2014 0838      Wt Readings from Last 3 Encounters:  06/28/23 160 lb 6.4 oz (72.8 kg)  03/06/23 166 lb 3.6 oz (75.4 kg)  12/14/22 166 lb 3.2 oz (75.4 kg)      Other studies Reviewed: Additional studies/ records that were reviewed today include: . Review of the above records demonstrates:    ASSESSMENT AND PLAN:  Problem List: 1. Hypertension-    BP is well controlled.   Cont current meds     2. Hyperlipidemia -    lipids look great several months ago.   Will recheck today .  ALT was mildly elevated at his last check   3. Tonsilar cancer ( radiation and chemotherapy)  -    4.  Carotid bruit -   stable  , mild - moderate carotid disease at this last duplex scan in July 2024     Current medicines are reviewed at length with the patient today.  The patient does not have concerns regarding medicines.  The following changes have been made:  no change   Disposition:      Signed, Omar Miss, MD  06/28/2023 8:33 AM    [facility] [address] [place], [place], [place]  [address] Phone: [phone]; Fax: [phone]

## 2023-06-28 ENCOUNTER — Ambulatory Visit: Payer: Medicare HMO | Attending: Cardiovascular Disease | Admitting: Cardiovascular Disease

## 2023-06-28 ENCOUNTER — Encounter: Payer: Self-pay | Admitting: Cardiovascular Disease

## 2023-06-28 VITALS — BP 136/82 | HR 67 | Ht 69.0 in | Wt 160.4 lb

## 2023-06-28 DIAGNOSIS — I6523 Occlusion and stenosis of bilateral carotid arteries: Secondary | ICD-10-CM

## 2023-06-28 DIAGNOSIS — I10 Essential (primary) hypertension: Secondary | ICD-10-CM

## 2023-06-28 DIAGNOSIS — E782 Mixed hyperlipidemia: Secondary | ICD-10-CM | POA: Diagnosis not present

## 2023-06-28 NOTE — Patient Instructions (Signed)
Lab Work: Lipids, ALT, BMET today If you have labs (blood work) drawn today and your tests are completely normal, you will receive your results only by: MyChart Message (if you have MyChart) OR A paper copy in the mail If you have any lab test that is abnormal or we need to change your treatment, we will call you to review the results.  Follow-Up: At [facility], you and your health needs are our priority.  As part of our continuing mission to provide you with exceptional heart care, we have created designated Provider Care Teams.  These Care Teams include your primary Cardiologist (physician) and Advanced Practice Providers (APPs -  Physician Assistants and Nurse Practitioners) who all work together to provide you with the care you need, when you need it.  Your next appointment:   1 year(s)  Provider:   Eligha Bridegroom, NP

## 2023-06-29 LAB — BASIC METABOLIC PANEL
BUN/Creatinine Ratio: 14 (ref 10–24)
BUN: 21 mg/dL (ref 8–27)
CO2: 26 mmol/L (ref 20–29)
Calcium: 9.7 mg/dL (ref 8.6–10.2)
Chloride: 104 mmol/L (ref 96–106)
Creatinine, Ser: 1.46 mg/dL — ABNORMAL HIGH (ref 0.76–1.27)
Glucose: 100 mg/dL — ABNORMAL HIGH (ref 70–99)
Potassium: 5.2 mmol/L (ref 3.5–5.2)
Sodium: 142 mmol/L (ref 134–144)
eGFR: 48 mL/min/{1.73_m2} — ABNORMAL LOW (ref >59)

## 2023-06-29 LAB — LIPID PANEL
Chol/HDL Ratio: 2.6 ratio (ref 0.0–5.0)
Cholesterol, Total: 92 mg/dL — ABNORMAL LOW (ref 100–199)
HDL: 35 mg/dL — ABNORMAL LOW (ref >39)
LDL Chol Calc (NIH): 39 mg/dL (ref 0–99)
Triglycerides: 92 mg/dL (ref 0–149)
VLDL Cholesterol Cal: 18 mg/dL (ref 5–40)

## 2023-06-29 LAB — ALT: ALT: 43 [IU]/L (ref 0–44)

## 2023-09-16 DIAGNOSIS — K080 Exfoliation of teeth due to systemic causes: Secondary | ICD-10-CM | POA: Diagnosis not present

## 2023-09-22 ENCOUNTER — Other Ambulatory Visit: Payer: Self-pay

## 2023-09-22 ENCOUNTER — Emergency Department (HOSPITAL_COMMUNITY): Payer: Medicare Other

## 2023-09-22 ENCOUNTER — Ambulatory Visit: Payer: Medicare HMO | Admitting: Internal Medicine

## 2023-09-22 ENCOUNTER — Telehealth: Payer: Self-pay | Admitting: Cardiovascular Disease

## 2023-09-22 ENCOUNTER — Emergency Department (HOSPITAL_COMMUNITY)
Admission: EM | Admit: 2023-09-22 | Discharge: 2023-09-22 | Disposition: A | Discharge status: Home / Self Care | Payer: Medicare Other | Attending: Emergency Medicine | Admitting: Emergency Medicine

## 2023-09-22 DIAGNOSIS — I160 Hypertensive urgency: Secondary | ICD-10-CM | POA: Insufficient documentation

## 2023-09-22 DIAGNOSIS — Z7982 Long term (current) use of aspirin: Secondary | ICD-10-CM | POA: Insufficient documentation

## 2023-09-22 DIAGNOSIS — R918 Other nonspecific abnormal finding of lung field: Secondary | ICD-10-CM | POA: Diagnosis not present

## 2023-09-22 DIAGNOSIS — Z79899 Other long term (current) drug therapy: Secondary | ICD-10-CM | POA: Insufficient documentation

## 2023-09-22 DIAGNOSIS — N3000 Acute cystitis without hematuria: Secondary | ICD-10-CM | POA: Diagnosis not present

## 2023-09-22 DIAGNOSIS — I10 Essential (primary) hypertension: Secondary | ICD-10-CM | POA: Diagnosis not present

## 2023-09-22 DIAGNOSIS — I700 Atherosclerosis of aorta: Secondary | ICD-10-CM | POA: Diagnosis not present

## 2023-09-22 LAB — CBC WITH DIFFERENTIAL/PLATELET
Abs Immature Granulocytes: 0.05 10*3/uL (ref 0.00–0.07)
Basophils Absolute: 0 10*3/uL (ref 0.0–0.1)
Basophils Relative: 1 %
Eosinophils Absolute: 0.1 10*3/uL (ref 0.0–0.5)
Eosinophils Relative: 2 %
HCT: 40.7 % (ref 39.0–52.0)
Hemoglobin: 13.2 g/dL (ref 13.0–17.0)
Immature Granulocytes: 1 %
Lymphocytes Relative: 11 %
Lymphs Abs: 0.7 10*3/uL (ref 0.7–4.0)
MCH: 30.6 pg (ref 26.0–34.0)
MCHC: 32.4 g/dL (ref 30.0–36.0)
MCV: 94.2 fL (ref 80.0–100.0)
Monocytes Absolute: 0.5 10*3/uL (ref 0.1–1.0)
Monocytes Relative: 8 %
Neutro Abs: 5.1 10*3/uL (ref 1.7–7.7)
Neutrophils Relative %: 77 %
Platelets: 198 10*3/uL (ref 150–400)
RBC: 4.32 MIL/uL (ref 4.22–5.81)
RDW: 13.1 % (ref 11.5–15.5)
WBC: 6.6 10*3/uL (ref 4.0–10.5)
nRBC: 0 % (ref 0.0–0.2)

## 2023-09-22 LAB — URINALYSIS, ROUTINE W REFLEX MICROSCOPIC
Bilirubin Urine: NEGATIVE
Glucose, UA: NEGATIVE mg/dL
Hgb urine dipstick: NEGATIVE
Ketones, ur: NEGATIVE mg/dL
Nitrite: NEGATIVE
Protein, ur: NEGATIVE mg/dL
Specific Gravity, Urine: 1.004 — ABNORMAL LOW (ref 1.005–1.030)
pH: 7 (ref 5.0–8.0)

## 2023-09-22 LAB — BASIC METABOLIC PANEL
Anion gap: 11 (ref 5–15)
BUN: 18 mg/dL (ref 8–23)
CO2: 25 mmol/L (ref 22–32)
Calcium: 9.6 mg/dL (ref 8.9–10.3)
Chloride: 104 mmol/L (ref 98–111)
Creatinine, Ser: 1.36 mg/dL — ABNORMAL HIGH (ref 0.61–1.24)
GFR, Estimated: 52 mL/min — ABNORMAL LOW (ref >60)
Glucose, Bld: 109 mg/dL — ABNORMAL HIGH (ref 70–99)
Potassium: 4.6 mmol/L (ref 3.5–5.1)
Sodium: 140 mmol/L (ref 135–145)

## 2023-09-22 LAB — MAGNESIUM: Magnesium: 2 mg/dL (ref 1.7–2.4)

## 2023-09-22 LAB — TSH: TSH: 4.62 u[IU]/mL — ABNORMAL HIGH (ref 0.350–4.500)

## 2023-09-22 LAB — TROPONIN I (HIGH SENSITIVITY): Troponin I (High Sensitivity): 17 ng/L (ref <18)

## 2023-09-22 MED ORDER — CEPHALEXIN 500 MG PO CAPS: 500.0000 mg | ORAL_CAPSULE | Freq: Four times a day (QID) | ORAL | 0 refills | Status: DC

## 2023-09-22 MED ORDER — LABETALOL HCL 5 MG/ML IV SOLN
10.0000 mg | Freq: Once | INTRAVENOUS | Status: AC
  Administered 2023-09-22: 10 mg via INTRAVENOUS
  Filled 2023-09-22: qty 4

## 2023-09-22 MED ORDER — CEPHALEXIN 250 MG PO CAPS
500.0000 mg | ORAL_CAPSULE | Freq: Once | ORAL | Status: AC
  Administered 2023-09-22: 500 mg via ORAL
  Filled 2023-09-22: qty 2

## 2023-09-22 MED ORDER — CARVEDILOL 12.5 MG PO TABS: 12.5000 mg | ORAL_TABLET | Freq: Two times a day (BID) | ORAL | Status: DC

## 2023-09-22 MED ORDER — HYDRALAZINE HCL 10 MG PO TABS
10.0000 mg | ORAL_TABLET | Freq: Once | ORAL | Status: AC
  Administered 2023-09-22: 10 mg via ORAL
  Filled 2023-09-22: qty 1

## 2023-09-22 NOTE — ED Provider Triage Note (Signed)
Emergency Medicine Provider Triage Evaluation Note  KIDD ASCHENBRENNER , a 83 y.o. male  was evaluated in triage.  Pt complains of hypertension.  He is otherwise asymptomatic.  Blood pressure noted to be 243/85.  He takes blood pressure medication took his medicines this morning this is abnormal for him.  He denies any neurologic symptoms or chest pain..  Review of Systems  Positive: Hypertension Negative: Neurologic symptoms  Physical Exam  BP (!) 243/85 (BP Location: Right Arm)   Pulse 65   Temp 97.7 F (36.5 C)   Resp 16   Ht 5\' 9"  (1.753 m)   Wt 72.6 kg   SpO2 100%   BMI 23.63 kg/m  Gen:   Awake, no distress    Resp:  Normal effort   MSK:   Moves extremities without difficulty   Other:     Medical Decision Making  Medically screening exam initiated at 12:36 PM.  Appropriate orders placed.  Maureen Ralphs was informed that the remainder of the evaluation will be completed by another provider, this initial triage assessment does not replace that evaluation, and the importance of remaining in the ED until their evaluation is complete.      Arthor Captain, PA-C 09/22/23 1238

## 2023-09-22 NOTE — ED Provider Notes (Signed)
[facility] AT [facility] Provider Note   CSN: [id number] Arrival date & time: 09/22/23  1047     History  Chief Complaint  Patient presents with   Hypertension    Omar Sawyer is a 83 y.o. male.  HPI Patient reports that blood pressure is usually controlled on his medications.  He is compliant with his medications.  He reports once before he had a high blood pressure spike about a year or 2 ago and is getting carotid ultrasounds.  He reports today he was monitoring his blood pressure and noticed it to be 243/85.  He denies any symptoms.  He denies any chest pain, shortness of breath, visual symptoms, headache, confusion.  He does not feel there have been other changes to his diet etc.  He reports he took his a.m. medications at about 7:30 AM.  He took Coreg and Cozaar.    Home Medications Prior to Admission medications   Medication Sig Start Date End Date Taking? Authorizing Provider  cephALEXin (KEFLEX) 500 MG capsule Take 1 capsule (500 mg total) by mouth 4 (four) times daily. 09/22/23  Yes Arby Barrette, MD  aspirin 81 MG tablet Take 81 mg by mouth daily.    [provider]  carvedilol (COREG) 6.25 MG tablet TAKE 1 TABLET TWICE DAILY 05/03/23   Nahser, Deloris Ping, MD  latanoprost (XALATAN) 0.005 % ophthalmic solution 1 drop at bedtime. 10/13/21   [provider]  losartan (COZAAR) 50 MG tablet Take 1 tablet (50 mg total) by mouth daily. 05/26/23   Nahser, Deloris Ping, MD  Misc Natural Products (ELDERBERRY ZINC/VIT C/IMMUNE MT)  08/13/20   [provider]  Multiple Vitamin (MULTIVITAMIN) tablet Take 1 tablet by mouth daily.    [provider]  omeprazole (PRILOSEC) 20 MG capsule Take 20 mg by mouth daily.  04/23/12   [provider]  rosuvastatin (CRESTOR) 5 MG tablet Take 1 tablet (5 mg total) by mouth daily. 01/07/23   Nahser, Deloris Ping, MD  sodium fluoride (FLUORISHIELD) 1.1 % GEL dental gel sodium fluoride 1.1  % dental paste 05/19/20   [provider]  tamsulosin (FLOMAX) 0.4 MG CAPS Take 0.4 mg by mouth daily.    [provider]  timolol (TIMOPTIC) 0.25 % ophthalmic solution Place 1 drop into both eyes daily. 04/27/23   [provider]      Allergies    Hydrocodone-acetaminophen and Hydrocodone    Review of Systems   Review of Systems  Physical Exam Updated Vital Signs BP (!) 159/63 (BP Location: Right Arm)   Pulse 64   Temp 98.6 F (37 C) (Oral)   Resp 14   Ht 5\' 9"  (1.753 m)   Wt 72.6 kg   SpO2 100%   BMI 23.63 kg/m  Physical Exam Constitutional:      Comments: Alert nontoxic well in appearance.  Clear mental status.  HENT:     Head: Normocephalic and atraumatic.     Mouth/Throat:     Pharynx: Oropharynx is clear.  Eyes:     Extraocular Movements: Extraocular movements intact.  Cardiovascular:     Rate and Rhythm: Normal rate and regular rhythm.  Pulmonary:     Effort: Pulmonary effort is normal.     Breath sounds: Normal breath sounds.  Abdominal:     General: There is no distension.     Palpations: Abdomen is soft.     Tenderness: There is no abdominal tenderness. There is no guarding.  Musculoskeletal:        General: No swelling or tenderness. Normal range of motion.     Right lower leg: No edema.     Left lower leg: No edema.  Skin:    General: Skin is warm and dry.  Neurological:     General: No focal deficit present.     Mental Status: He is oriented to person, place, and time.     Motor: No weakness.     Coordination: Coordination normal.     Comments: Patient has clear mental status.  He is ambulatory without difficulty.  No focal motor deficits.  Psychiatric:        Mood and Affect: Mood normal.     ED Results / Procedures / Treatments   Labs (all labs ordered are listed, but only abnormal results are displayed) Labs Reviewed  BASIC METABOLIC PANEL - Abnormal; Notable for the following components:      Result Value    Glucose, Bld 109 (*)    Creatinine, Ser 1.36 (*)    GFR, Estimated 52 (*)    All other components within normal limits  URINALYSIS, ROUTINE W REFLEX MICROSCOPIC - Abnormal; Notable for the following components:   Color, Urine STRAW (*)    Specific Gravity, Urine 1.004 (*)    Leukocytes,Ua LARGE (*)    Bacteria, UA RARE (*)    All other components within normal limits  TSH - Abnormal; Notable for the following components:   TSH 4.620 (*)    All other components within normal limits  URINE CULTURE  CBC WITH DIFFERENTIAL/PLATELET  MAGNESIUM  TROPONIN I (HIGH SENSITIVITY)  TROPONIN I (HIGH SENSITIVITY)    EKG EKG Interpretation Date/Time:  Thursday September 22 2023 12:51:49 EST Ventricular Rate:  70 PR Interval:  138 QRS Duration:  72 QT Interval:  384 QTC Calculation: 414 R Axis:   79  Text Interpretation: Sinus rhythm with frequent Premature ventricular complexes in a pattern of bigeminy Cannot rule out Anterior infarct , age undetermined Abnormal ECG When compared with ECG of 28-Jun-2023 08:29, PREVIOUS ECG IS PRESENT agree, bigeminey new since previous Confirmed by Arby Barrette [phone]) on 09/22/2023 7:33:19 PM  Radiology DG Chest Port 1 View Result Date: 09/22/2023 CLINICAL DATA:  Hypertension EXAM: PORTABLE CHEST 1 VIEW COMPARISON:  10/09/2014 FINDINGS: Minimal atelectasis or scarring at the lingula and left base. No acute consolidation, pleural effusion or pneumothorax. Stable cardiomediastinal silhouette with aortic atherosclerosis IMPRESSION: No active disease. Minimal atelectasis or scarring at the lingula and left base. Electronically Signed   By: Jasmine Pang M.D.   On: 09/22/2023 19:55    Procedures Procedures    Medications Ordered in ED Medications  carvedilol (COREG) tablet 12.5 mg (has no administration in time range)  cephALEXin (KEFLEX) capsule 500 mg (has no administration in time range)  hydrALAZINE (APRESOLINE) tablet 10 mg (10 mg Oral Given 09/22/23  1827)  labetalol (NORMODYNE) injection 10 mg (10 mg Intravenous Given 09/22/23 2039)    ED Course/ Medical Decision Making/ A&P                                 Medical Decision Making Amount and/or Complexity of Data Reviewed Labs: ordered. Radiology: ordered.  Risk Prescription drug management.  Patient had onset of hypertension today.  No evident precipitating event.  Patient was not symptomatic with this.  Pressures however were very elevated at 240s over 80s.  Patient reports  compliance with his medications.  No signs of endorgan damage.  However with significantly elevated pressures will opt to treat.  Patient had been given a tablet of hydralazine through triage.  There was some improvement with blood pressures down to 191/83.  However it subsequently patient rebounded again and had another high blood pressure at 218/74.  Still no complaints of headache chest pain or dizziness  Labetalol 10 mg and carvedilol 12.5 mg orally ordered.  Patient had good response to this and ultimately now over several hours blood pressures have trended down to 187/94 then 159/63.  Patient reports he feels fine.  He does not have any associated symptoms or complaints.  We did review that the patient has been having some urgency and slight dysuria.  With urinalysis positive for white cells and leuk esterase will opt to treat with Keflex.  At this time I have discussed the plan with the patient to increase his losartan to 100 mg in the morning and continue his carvedilol at 6.25 mg given heart rates in the 60s and low 70s.  He will monitor his blood pressures at home and to be determine if a dose increase in carvedilol also is appropriate.  We have carefully reviewed return precautions and patient will follow-up with his PCP or cardiologist as soon as possible.        Final Clinical Impression(s) / ED Diagnoses Final diagnoses:  Hypertensive urgency  Acute cystitis without hematuria    Rx / DC  Orders ED Discharge Orders          Ordered    cephALEXin (KEFLEX) 500 MG capsule  4 times daily        09/22/23 2227              Arby Barrette, MD 09/22/23 2246

## 2023-09-22 NOTE — Telephone Encounter (Signed)
Called patient back about his message. Patient complaining of BP being elevated. 168/81 and today 214/100. Patient's HR 60's. Patient will go to the ED to get evaluated.

## 2023-09-22 NOTE — Telephone Encounter (Signed)
Pt c/o BP issue: STAT if pt c/o blurred vision, one-sided weakness or slurred speech  1. What are your last 5 BP readings?   214/100 ("just a minute ago") 168/81  2. Are you having any other symptoms (ex. Dizziness, headache, blurred vision, passed out)?   No  3. What is your BP issue?   Patient is concerned his BP readings have been trending high.  Patient wants advice on next steps.

## 2023-09-22 NOTE — Discharge Instructions (Addendum)
1.  Increase your losartan to 100 mg daily in the morning.  This is 2 tablets. 2.  Continue your carvedilol 6.25 mg twice daily. 3.  Measure your blood pressures about 3 times a day at the same time in a calm situation.  You will need to monitor your response to increasing her Cozaar dose.  You may need your carvedilol dose increased however, carvedilol also slows the heart rate and you need to monitor your heart rate as well. 4.  Schedule a follow-up with your primary care doctor or cardiologist as soon as possible for recheck.  You will need your blood pressure medications monitored and potentially adjusted. 5.  Return to the emergency department immediately if you have chest pain, feel lightheaded like he might pass out, your heart is racing or skipping, you are getting unusual nausea or weakness or other concerning symptoms.  Take an ambulance, do not drive yourself. 6.  Your urine shows signs of infection.  You are being treated with an antibiotic called Keflex.  Follow-up with your doctor for recheck of your urine when you have completed antibiotics

## 2023-09-22 NOTE — ED Triage Notes (Signed)
Pt. Stated, Im here cause of high BP, dinies any other symptoms.

## 2023-09-22 NOTE — ED Notes (Signed)
Notified provider of pt vital signs. Will await orders and continue to monitor.

## 2023-09-24 LAB — URINE CULTURE: Culture: 80000 — AB

## 2023-09-25 ENCOUNTER — Telehealth (HOSPITAL_BASED_OUTPATIENT_CLINIC_OR_DEPARTMENT_OTHER): Payer: Self-pay | Admitting: *Deleted

## 2023-09-25 NOTE — Telephone Encounter (Signed)
Post ED Visit - Positive Culture Follow-up  Culture report reviewed by antimicrobial stewardship pharmacist: [facility] []  Enzo Bi, Pharm.D. []  Celedonio Miyamoto, Pharm.D., BCPS AQ-ID []  Garvin Fila, Pharm.D., BCPS []  Georgina Pillion, Pharm.D., BCPS []  Brandon, [address].D., BCPS, AAHIVP []  Estella Husk, Pharm.D., BCPS, AAHIVP []  Lysle Pearl, PharmD, BCPS []  Phillips Climes, PharmD, BCPS []  Agapito Games, PharmD, BCPS []  Verlan Friends, PharmD []  Mervyn Gay, PharmD, BCPS [x]  Ivery Quale, PharmD  [facility] []  Len Childs, PharmD []  Greer Pickerel, PharmD []  Adalberto Cole, PharmD []  Perlie Gold, Rph []  Lonell Face) Jean Rosenthal, PharmD []  Earl Many, PharmD []  Junita Push, PharmD []  Dorna Leitz, PharmD []  Terrilee Files, PharmD []  Lynann Beaver, PharmD []  Keturah Barre, PharmD []  Loralee Pacas, PharmD []  Bernadene Person, PharmD   Positive urine culture Treated with Cephalexin, organism sensitive to the same and no further patient follow-up is required at this time.  Patsey Berthold 09/25/2023, 8:28 AM

## 2023-09-26 ENCOUNTER — Telehealth: Payer: Self-pay | Admitting: Cardiovascular Disease

## 2023-09-26 NOTE — Telephone Encounter (Signed)
Patient reports that he went to the ER last week for elevated blood pressure at 240s/90s. He reports that they increased his losartan to 100 mg daily. He is also taking carvedilol 6.25 mg twice daily. He states that he also found out that he had a UTI when he was at the ER and was given antibiotics. He monitored his blood pressure over the weekend and reports 168/88, 188/78. Heart rates have been in the upper 60s. He took his blood pressure when he first got up this morning and it was 191/92. He had not taken his medications yet. He reports that he feels fine and has only noticed a slight headache that does not last long. He took his blood pressure medications about 15 minutes ago. I advised him to recheck his blood pressure in a couple of hours and call us back if it remains high. He is scheduled for an ER follow up appointment with Robin Searing, NP on 1/29.

## 2023-09-26 NOTE — Telephone Encounter (Signed)
Pt c/o BP issue: STAT if pt c/o blurred vision, one-sided weakness or slurred speech  1. What are your last 5 BP readings?   1/25 - 168/88 1/26 - 188/78 1/27 - 191/92  2. Are you having any other symptoms (ex. Dizziness, headache, blurred vision, passed out)?   No  3. What is your BP issue?   Patient is concerned his BP reading have been trending high.  Patient noted he was recently in the ED for high BP readings.

## 2023-09-27 NOTE — Progress Notes (Unsigned)
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   Tonsil =positive for p16(HR HPV MARKER)    sQUAMOUS CELL CARCINOMA   Metastasis to lymph nodes (HCC) Pet Scan 10/26/12   Right Level IIa abd Level III Lymph Nodes   Mixed hyperlipidemia    Mucositis 12/04/2012   Neuromuscular disorder (HCC)    b/l tremors,    Personal history of adenomatous colonic polyps 07/23/2012   2010   Renal insufficiency 12/16/2012   Renal insufficiency 12/16/2012   S/P radiation therapy 11/13/2012-12/29/2012     Right tonsil/ bilateral neck/ total dose 70 Gy in 35 fractions   Status post chemotherapy    concurrent chemo with q3 weeks Cisplatin and daily radiation between 11/13/2012 and 12/29/2012   Thrush, oral 05/15/2013   Unspecified sinusitis (chronic)     History of Present Illness  Omar Sawyer is a 83 y.o. male with a PMH of essential HTN, orthostatic hypotension, GERD, HLD, tonsillar cancer s/p radiation and chemotherapy, carotid artery disease (RICA 40-59% and LICA 1-39%) who presents today for post ED follow-up for hypertension.  Omar Sawyer has been followed by Dr. Elease Hashimoto for management of hypertension.  He underwent carotid Doppler studies on 02/2022 that revealed moderate plaque in R ICA with minimal plaque and LICA with recommendation of annual monitoring that was completed 02/2023 with no progression of disease.  He was last seen by Dr. Elease Hashimoto on 06/28/2023 and provided a log of his BPs that were all  within normal limits.  He contacted our office on 09/22/2023 with complaint of elevated BP with systolics in the 200s over 100s diastolic. He was seen in the ED for evaluation and was not symptomatic and was treated with hydralazine and labetalol along with carvedilol with trending downward of BP.  Evaluated for UTI due to urgency and was found to have positive white cells and was treated with Keflex.  He was advised to increase his losartan to 100 mg p.m. and continue current dose of carvedilol 6.25 mg.  [name] presents today with his wife for post ED follow-up. Despite recent adjustments to his losartan dosage, the patient's blood pressure readings have been consistently high.  During recent ED visit his TSH levels were elevated which may be causing his erratic BP levels.  The patient also reports a recent fall, which occurred after a rapid movement from the bathroom to the bed. The patient did not lose consciousness but did hit his head during the fall.  He was advised to go to the ED for CT scan to rule out possible brain bleed.    Omar Sawyer reports that the patient has been experiencing a persistent cough, which started the previous night. The patient denies any productive cough. The patient also reports a decreased appetite, which has been ongoing for some time. The patient's spouse confirms that the patient's fluid intake has  been inconsistent, and the patient has been relying on Ensure for nutrition. During today's visit the patient reports no new cardiac complaints.  Patient denies chest pain, palpitations, dyspnea, PND, orthopnea, nausea, vomiting, dizziness, syncope, edema, weight gain, or early satiety.  Review of Systems  Please see the history of present illness.    All other systems reviewed and are otherwise negative except as noted above.  Physical Exam    Wt Readings from Last 3 Encounters:  09/22/23 160 lb (72.6 kg)  06/28/23 160 lb 6.4 oz (72.8 kg)  03/06/23 166 lb 3.6 oz (75.4  kg)   MV:HQION were no vitals filed for this visit.,There is no height or weight on file to calculate BMI. GEN: Well nourished, well developed in no acute distress Neck: No JVD; No carotid bruits Pulmonary: Diminished in the bases with slight consolidation on the left lower lobe without rales, wheezing or rhonchi  Cardiovascular: Normal rate. Regular rhythm. Normal S1. Normal S2.   Murmurs: There is no murmur.  ABDOMEN: Soft, non-tender, non-distended EXTREMITIES:  No edema; No deformity   EKG/LABS/ Recent Cardiac Studies   ECG personally reviewed by me today -none completed today  Risk Assessment/Calculations:          Lab Results  Component Value Date   WBC 6.6 09/22/2023   HGB 13.2 09/22/2023   HCT 40.7 09/22/2023   MCV 94.2 09/22/2023   PLT 198 09/22/2023   Lab Results  Component Value Date   CREATININE 1.36 (H) 09/22/2023   BUN 18 09/22/2023   NA 140 09/22/2023   K 4.6 09/22/2023   CL 104 09/22/2023   CO2 25 09/22/2023   Lab Results  Component Value Date   CHOL 92 (L) 06/28/2023   HDL 35 (L) 06/28/2023   LDLCALC 39 06/28/2023   LDLDIRECT 70.0 10/17/2014   TRIG 92 06/28/2023   CHOLHDL 2.6 06/28/2023    No results found for: "HGBA1C" Assessment & Plan    1.  Essential hypertension: -Patient's blood pressure today was stable at 130/58 -His logs show some BP levels elevated at 207/90 despite increase in losartan. Elevated blood pressure despite recent increase in Losartan to 100mg . Possible contributors include recent UTI, potential thyroid dysfunction, and stress. -Continue Losartan 100mg  daily and carvedilol 6.25 mg -Check thyroid function (T4) and kidney function today. -Consider secondary causes of hypertension if blood pressure remains uncontrolled.  2.  Hyperlipidemia: -Patient's last LDL cholesterol was 39 -Crestor 5 mg daily  3.  Bilateral carotid artery stenosis: -RICA 40-59% and LICA 1-39% with annual surveillance and no progression since  previous scan on 7/thousand 24  4.Urinary Tract Infection Recent UTI treated with antibiotics, but still experiencing urinary frequency. -Recommend urinalysis after completion of antibiotics if frequency persists.  5.Presyncope Recent episode of near fainting with a fall, possibly related to blood pressure fluctuations or arrhythmia. -Order 7-day event monitor to assess for arrhythmias. -Recommend CT head to rule out subdural hematoma due to recent fall and head strike.  Disposition: Follow-up with Kristeen Miss, MD   1 month   Signed, Napoleon Form, Leodis Rains, NP 09/27/2023, 7:49 AM [facility]

## 2023-09-28 ENCOUNTER — Ambulatory Visit: Payer: Medicare Other | Attending: Nurse Practitioner | Admitting: Nurse Practitioner

## 2023-09-28 ENCOUNTER — Encounter: Payer: Self-pay | Admitting: Nurse Practitioner

## 2023-09-28 ENCOUNTER — Ambulatory Visit (INDEPENDENT_AMBULATORY_CARE_PROVIDER_SITE_OTHER): Payer: Medicare Other

## 2023-09-28 VITALS — BP 130/58 | HR 84 | Ht 69.0 in | Wt 157.8 lb

## 2023-09-28 DIAGNOSIS — R42 Dizziness and giddiness: Secondary | ICD-10-CM

## 2023-09-28 DIAGNOSIS — N390 Urinary tract infection, site not specified: Secondary | ICD-10-CM

## 2023-09-28 DIAGNOSIS — I6523 Occlusion and stenosis of bilateral carotid arteries: Secondary | ICD-10-CM

## 2023-09-28 DIAGNOSIS — I10 Essential (primary) hypertension: Secondary | ICD-10-CM | POA: Diagnosis not present

## 2023-09-28 DIAGNOSIS — E782 Mixed hyperlipidemia: Secondary | ICD-10-CM | POA: Diagnosis not present

## 2023-09-28 DIAGNOSIS — R55 Syncope and collapse: Secondary | ICD-10-CM

## 2023-09-28 LAB — BASIC METABOLIC PANEL
BUN/Creatinine Ratio: 16 (ref 10–24)
BUN: 25 mg/dL (ref 8–27)
CO2: 21 mmol/L (ref 20–29)
Calcium: 9.4 mg/dL (ref 8.6–10.2)
Chloride: 99 mmol/L (ref 96–106)
Creatinine, Ser: 1.57 mg/dL — ABNORMAL HIGH (ref 0.76–1.27)
Glucose: 119 mg/dL — ABNORMAL HIGH (ref 70–99)
Potassium: 4.9 mmol/L (ref 3.5–5.2)
Sodium: 139 mmol/L (ref 134–144)
eGFR: 44 mL/min/{1.73_m2} — ABNORMAL LOW (ref >59)

## 2023-09-28 LAB — T4, FREE: Free T4: 0.96 ng/dL (ref 0.82–1.77)

## 2023-09-28 NOTE — Progress Notes (Unsigned)
Enrolled for [employer] to mail a ZIO XT long term holter monitor to the patients address on file.   Dr. Elease Hashimoto to read.

## 2023-09-28 NOTE — Patient Instructions (Addendum)
Medication Instructions:  No changes  *If you need a refill on your cardiac medications before your next appointment, please call your pharmacy*   Lab Work: BMET FT4 If you have labs (blood work) drawn today and your tests are completely normal, you will receive your results only by: MyChart Message (if you have MyChart) OR A paper copy in the mail If you have any lab test that is abnormal or we need to change your treatment, we will call you to review the results.   Testing/Procedures: Your physician has recommended that you wear a 7 DAY ZIO-PATCH monitor. The Zio patch cardiac monitor continuously records heart rhythm data for up to 14 days, this is for patients being evaluated for multiple types heart rhythms. For the first 24 hours post application, please avoid getting the Zio monitor wet in the shower or by excessive sweating during exercise. After that, feel free to carry on with regular activities. Keep soaps and lotions away from the ZIO XT Patch.  This will be mailed to you, please expect 7-10 days to receive.    Applying the monitor   Shave hair from upper left chest.   Hold abrader disc by orange tab.  Rub abrader in 40 strokes over left upper chest as indicated in your monitor instructions.   Clean area with 4 enclosed alcohol pads .  Use all pads to assure are is cleaned thoroughly.  Let dry.   Apply patch as indicated in monitor instructions.  Patch will be place under collarbone on left side of chest with arrow pointing upward.   Rub patch adhesive wings for 2 minutes.Remove white label marked "1".  Remove white label marked "2".  Rub patch adhesive wings for 2 additional minutes.   While looking in a mirror, press and release button in center of patch.  A small green light will flash 3-4 times .  This will be your only indicator the monitor has been turned on.     Do not shower for the first 24 hours.  You may shower after the first 24 hours.   Press button if you  feel a symptom. You will hear a small click.  Record Date, Time and Symptom in the Patient Log Book.   When you are ready to remove patch, follow instructions on last 2 pages of Patient Log Book.  Stick patch monitor onto last page of Patient Log Book.   Place Patient Log Book in [place] box.  Use locking tab on box and tape box closed securely.  The [employer] and [employer] has [employer] on it.  Please place in mailbox as soon as possible.  Your physician should have your test results approximately 7 days after the monitor has been mailed back to [facility].   Call [facility] Customer Care at [phone] if you have questions regarding your ZIO XT patch monitor.  Call them immediately if you see an orange light blinking on your monitor.   If your monitor falls off in less than 4 days contact our Monitor department at [phone].  If your monitor becomes loose or falls off after 4 days call [employer] at [phone] for suggestions on securing your monitor    Follow-Up: At [facility], you and your health needs are our priority.  As part of our continuing mission to provide you with exceptional heart care, we have created designated Provider Care Teams.  These Care Teams include your primary Cardiologist (physician) and Advanced Practice Providers (APPs -  Physician Assistants and Nurse Practitioners) who all work together to provide you with the care you need, when you need it.  Your next appointment:   1 month(s)  Provider:   Robin Searing   Other Instructions   1st Floor: - Lobby - Registration  - Pharmacy  - Lab - Cafe  2nd Floor: - PV Lab - Diagnostic Testing (echo, CT, nuclear med)  3rd Floor: - Vacant  4th Floor: - TCTS (cardiothoracic surgery) - AFib Clinic - Structural Heart Clinic - Vascular Surgery  - Vascular Ultrasound  5th Floor: - HeartCare Cardiology (general and EP) - Clinical Pharmacy for coumadin, hypertension, lipid, weight-loss  medications, and med management appointments    Valet parking services will be available as well.

## 2023-09-29 ENCOUNTER — Encounter (HOSPITAL_COMMUNITY): Payer: Self-pay

## 2023-09-29 ENCOUNTER — Other Ambulatory Visit: Payer: Self-pay

## 2023-09-29 ENCOUNTER — Emergency Department (HOSPITAL_COMMUNITY)
Admission: EM | Admit: 2023-09-29 | Discharge: 2023-09-29 | Disposition: A | Discharge status: Home / Self Care | Payer: Medicare Other | Attending: Student | Admitting: Student

## 2023-09-29 ENCOUNTER — Emergency Department (HOSPITAL_COMMUNITY): Payer: Medicare Other

## 2023-09-29 DIAGNOSIS — J189 Pneumonia, unspecified organism: Secondary | ICD-10-CM | POA: Diagnosis not present

## 2023-09-29 DIAGNOSIS — J101 Influenza due to other identified influenza virus with other respiratory manifestations: Secondary | ICD-10-CM | POA: Insufficient documentation

## 2023-09-29 DIAGNOSIS — Z85818 Personal history of malignant neoplasm of other sites of lip, oral cavity, and pharynx: Secondary | ICD-10-CM | POA: Insufficient documentation

## 2023-09-29 DIAGNOSIS — J168 Pneumonia due to other specified infectious organisms: Secondary | ICD-10-CM | POA: Insufficient documentation

## 2023-09-29 DIAGNOSIS — Z7982 Long term (current) use of aspirin: Secondary | ICD-10-CM | POA: Diagnosis not present

## 2023-09-29 DIAGNOSIS — R0902 Hypoxemia: Secondary | ICD-10-CM | POA: Diagnosis not present

## 2023-09-29 DIAGNOSIS — R059 Cough, unspecified: Secondary | ICD-10-CM | POA: Diagnosis not present

## 2023-09-29 DIAGNOSIS — Z79899 Other long term (current) drug therapy: Secondary | ICD-10-CM | POA: Insufficient documentation

## 2023-09-29 DIAGNOSIS — Z85828 Personal history of other malignant neoplasm of skin: Secondary | ICD-10-CM | POA: Insufficient documentation

## 2023-09-29 DIAGNOSIS — Z20822 Contact with and (suspected) exposure to covid-19: Secondary | ICD-10-CM | POA: Insufficient documentation

## 2023-09-29 DIAGNOSIS — I10 Essential (primary) hypertension: Secondary | ICD-10-CM | POA: Insufficient documentation

## 2023-09-29 DIAGNOSIS — D72829 Elevated white blood cell count, unspecified: Secondary | ICD-10-CM | POA: Insufficient documentation

## 2023-09-29 DIAGNOSIS — R0602 Shortness of breath: Secondary | ICD-10-CM | POA: Diagnosis not present

## 2023-09-29 DIAGNOSIS — I700 Atherosclerosis of aorta: Secondary | ICD-10-CM | POA: Diagnosis not present

## 2023-09-29 DIAGNOSIS — R918 Other nonspecific abnormal finding of lung field: Secondary | ICD-10-CM | POA: Diagnosis not present

## 2023-09-29 DIAGNOSIS — R0600 Dyspnea, unspecified: Secondary | ICD-10-CM | POA: Diagnosis not present

## 2023-09-29 DIAGNOSIS — J1189 Influenza due to unidentified influenza virus with other manifestations: Secondary | ICD-10-CM | POA: Diagnosis not present

## 2023-09-29 LAB — CBC WITH DIFFERENTIAL/PLATELET
Abs Immature Granulocytes: 0.03 10*3/uL (ref 0.00–0.07)
Basophils Absolute: 0 10*3/uL (ref 0.0–0.1)
Basophils Relative: 0 %
Eosinophils Absolute: 0 10*3/uL (ref 0.0–0.5)
Eosinophils Relative: 0 %
HCT: 38.2 % — ABNORMAL LOW (ref 39.0–52.0)
Hemoglobin: 12.4 g/dL — ABNORMAL LOW (ref 13.0–17.0)
Immature Granulocytes: 0 %
Lymphocytes Relative: 5 %
Lymphs Abs: 0.5 10*3/uL — ABNORMAL LOW (ref 0.7–4.0)
MCH: 30.8 pg (ref 26.0–34.0)
MCHC: 32.5 g/dL (ref 30.0–36.0)
MCV: 95 fL (ref 80.0–100.0)
Monocytes Absolute: 1 10*3/uL (ref 0.1–1.0)
Monocytes Relative: 9 %
Neutro Abs: 9.6 10*3/uL — ABNORMAL HIGH (ref 1.7–7.7)
Neutrophils Relative %: 86 %
Platelets: 125 10*3/uL — ABNORMAL LOW (ref 150–400)
RBC: 4.02 MIL/uL — ABNORMAL LOW (ref 4.22–5.81)
RDW: 13.4 % (ref 11.5–15.5)
WBC Morphology: INCREASED
WBC: 11.2 10*3/uL — ABNORMAL HIGH (ref 4.0–10.5)
nRBC: 0 % (ref 0.0–0.2)

## 2023-09-29 LAB — COMPREHENSIVE METABOLIC PANEL
ALT: 23 U/L (ref 0–44)
AST: 32 U/L (ref 15–41)
Albumin: 3.3 g/dL — ABNORMAL LOW (ref 3.5–5.0)
Alkaline Phosphatase: 49 U/L (ref 38–126)
Anion gap: 9 (ref 5–15)
BUN: 32 mg/dL — ABNORMAL HIGH (ref 8–23)
CO2: 26 mmol/L (ref 22–32)
Calcium: 9 mg/dL (ref 8.9–10.3)
Chloride: 99 mmol/L (ref 98–111)
Creatinine, Ser: 1.68 mg/dL — ABNORMAL HIGH (ref 0.61–1.24)
GFR, Estimated: 40 mL/min — ABNORMAL LOW (ref >60)
Glucose, Bld: 132 mg/dL — ABNORMAL HIGH (ref 70–99)
Potassium: 4.4 mmol/L (ref 3.5–5.1)
Sodium: 134 mmol/L — ABNORMAL LOW (ref 135–145)
Total Bilirubin: 0.9 mg/dL (ref 0.0–1.2)
Total Protein: 6.7 g/dL (ref 6.5–8.1)

## 2023-09-29 LAB — URINALYSIS, ROUTINE W REFLEX MICROSCOPIC
Bilirubin Urine: NEGATIVE
Glucose, UA: NEGATIVE mg/dL
Hgb urine dipstick: NEGATIVE
Ketones, ur: NEGATIVE mg/dL
Leukocytes,Ua: NEGATIVE
Nitrite: NEGATIVE
Protein, ur: 30 mg/dL — AB
Specific Gravity, Urine: 1.013 (ref 1.005–1.030)
pH: 5 (ref 5.0–8.0)

## 2023-09-29 LAB — RESP PANEL BY RT-PCR (RSV, FLU A&B, COVID)  RVPGX2
Influenza A by PCR: POSITIVE — AB
Influenza B by PCR: NEGATIVE
Resp Syncytial Virus by PCR: NEGATIVE
SARS Coronavirus 2 by RT PCR: NEGATIVE

## 2023-09-29 MED ORDER — AMOXICILLIN-POT CLAVULANATE 875-125 MG PO TABS: 1.0000 | ORAL_TABLET | Freq: Two times a day (BID) | ORAL | 0 refills | Status: DC

## 2023-09-29 MED ORDER — AMOXICILLIN-POT CLAVULANATE 875-125 MG PO TABS
1.0000 | ORAL_TABLET | Freq: Once | ORAL | Status: AC
  Administered 2023-09-29: 1 via ORAL
  Filled 2023-09-29: qty 1

## 2023-09-29 MED ORDER — LIDOCAINE 5 % EX PTCH: 1.0000 | MEDICATED_PATCH | CUTANEOUS | 0 refills | Status: DC

## 2023-09-29 MED ORDER — FOSFOMYCIN TROMETHAMINE 3 G PO PACK
3.0000 g | PACK | Freq: Once | ORAL | Status: AC
  Administered 2023-09-29: 3 g via ORAL
  Filled 2023-09-29: qty 3

## 2023-09-29 MED ORDER — DICYCLOMINE HCL 20 MG PO TABS: 20.0000 mg | ORAL_TABLET | Freq: Two times a day (BID) | ORAL | 0 refills | Status: DC | PRN

## 2023-09-29 MED ORDER — IPRATROPIUM-ALBUTEROL 0.5-2.5 (3) MG/3ML IN SOLN
9.0000 mL | Freq: Once | RESPIRATORY_TRACT | Status: AC
  Administered 2023-09-29: 9 mL via RESPIRATORY_TRACT
  Filled 2023-09-29: qty 9
  Filled 2023-09-29: qty 3

## 2023-09-29 NOTE — ED Triage Notes (Signed)
Patient tested positive for flu a, pneumonia, UTI. Started last Thursday. Still taking keflex. Was sent from his PCP for admission.

## 2023-09-29 NOTE — ED Provider Triage Note (Signed)
Emergency Medicine Provider Triage Evaluation Note  Omar Sawyer , a 83 y.o. male  was evaluated in triage.  Reports he was sent over by urgent care due to concern for a low oxygen level.  They reported an oxygen level in the 80s, and he also tested positive for flu and they reported pneumonia on his chest x-ray.  He is currently undergoing treatment for UTI that was diagnosed last week after getting out of the hospital.  Patient denies any chest pain or shortness of breath, does not use oxygen at home.  Review of Systems  Positive: As above Negative: As above  Physical Exam  BP (!) 170/85   Pulse 77   Temp 98 F (36.7 C) (Oral)   Resp 17   Ht 5\' 9"  (1.753 m)   Wt 70.8 kg   SpO2 96%   BMI 23.04 kg/m  Gen:   Awake, no distress   Resp:  Normal effort.,  Breathing comfortably on room air.  Oxygen 96% on room air here. MSK:   Moves extremities without difficulty   Medical Decision Making  Medically screening exam initiated at 5:45 PM.  Appropriate orders placed.  Maureen Ralphs was informed that the remainder of the evaluation will be completed by another provider, this initial triage assessment does not replace that evaluation, and the importance of remaining in the ED until their evaluation is complete.     Arabella Merles, PA-C 09/29/23 1746

## 2023-09-30 NOTE — ED Provider Notes (Signed)
[place] EMERGENCY DEPARTMENT AT [facility] Provider Note  CSN: [id number] Arrival date & time: 09/29/23 1729  Chief Complaint(s) Pneumonia  HPI [name] is a 83 y.o. male with PMH tonsillar cancer, GERD, prostatitis who presents emerged department for evaluation of shortness of breath and cough.  Patient was seen in the emergency department on 09/22/2023 for hypertensive urgency and ultimately diagnosed with a UTI for which he is on Keflex and has 3 days left of his antibiotic regimen.  Patient reportedly saw a primary care physician or went to urgent care (unclear at this time) who found the patient to be hypoxic in the 80s and diagnosed him with pneumonia and influenza.  He was then sent to the emergency department for further evaluation.  Here in the Emergency Department, patient is saturating 96% on room air without significant respiratory distress or accessory muscle use.  Endorses persistent cough but denies chest pain, abdominal pain, nausea, vomiting or other systemic symptoms.   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   Tonsil =positive for p16(HR HPV MARKER)    sQUAMOUS CELL CARCINOMA   Metastasis to lymph nodes (HCC) Pet Scan 10/26/12   Right Level IIa abd Level III Lymph Nodes   Mixed hyperlipidemia    Mucositis 12/04/2012   Neuromuscular disorder (HCC)    b/l tremors,    Personal history of adenomatous colonic polyps 07/23/2012   2010   Renal insufficiency 12/16/2012   Renal insufficiency 12/16/2012   S/P radiation therapy 11/13/2012-12/29/2012     Right tonsil/ bilateral neck/ total dose 70 Gy in 35 fractions   Status post chemotherapy    concurrent chemo with  q3 weeks Cisplatin and daily radiation between 11/13/2012 and 12/29/2012   Thrush, oral 05/15/2013   Unspecified sinusitis (chronic)    Patient Active Problem List   Diagnosis Date Noted   Preventive measure 07/22/2014   Anemia, unspecified 11/06/2013   Renal insufficiency 12/16/2012   Cancer of tonsillar fossa (HCC) 10/27/2012   History of colonic polyps 07/23/2012   HTN (hypertension) 06/23/2012   Hyperlipidemia 06/23/2012   Home Medication(s) Prior to Admission medications   Medication Sig Start Date End Date Taking? Authorizing Provider  amoxicillin-clavulanate (AUGMENTIN) 875-125 MG tablet Take 1 tablet by mouth every 12 (twelve) hours. 09/29/23  Yes Evalene Vath, MD  aspirin 81 MG tablet Take 81 mg by mouth daily.    [provider]  carvedilol (COREG) 6.25 MG tablet TAKE 1 TABLET TWICE DAILY 05/03/23   Nahser, Deloris Ping, MD  cephALEXin (KEFLEX) 500 MG capsule Take 1 capsule (500 mg total) by mouth 4 (four) times daily. 09/22/23   Arby Barrette, MD  clotrimazole-betamethasone (LOTRISONE) cream Apply 1 Application topically daily at 6 (six) AM. 08/09/23   [provider]  latanoprost (XALATAN) 0.005 % ophthalmic solution 1 drop at bedtime. 10/13/21   [provider]  losartan (COZAAR) 50 MG tablet Take 1 tablet (50 mg total) by mouth daily. Patient taking differently: Take 100 mg by mouth daily. 05/26/23   Nahser, Deloris Ping, MD  Misc Natural Products [facility] ZINC/VIT  C/IMMUNE MT)  08/13/20   [provider]  Multiple Vitamin (MULTIVITAMIN) tablet Take 1 tablet by mouth daily.    [provider]  omeprazole (PRILOSEC) 20 MG capsule Take 20 mg by mouth daily.  04/23/12   [provider]  rosuvastatin (CRESTOR) 5 MG tablet Take 1 tablet (5 mg total) by mouth daily. 01/07/23   Nahser, Deloris Ping, MD  sodium fluoride (FLUORISHIELD) 1.1 % GEL dental gel sodium fluoride 1.1 % dental paste 05/19/20   [provider]  tamsulosin  (FLOMAX) 0.4 MG CAPS Take 0.4 mg by mouth daily.    [provider]  timolol (TIMOPTIC) 0.25 % ophthalmic solution Place 1 drop into both eyes daily. 04/27/23   [provider]                                                                                                                                    Past Surgical History Past Surgical History:  Procedure Laterality Date   Biopsy of Right Tonsil Right 10/03/2012   Squamous Cell Carcinoma      BLEPHAROPLASTY     CATARACT EXTRACTION, BILATERAL     COLONOSCOPY  2011   polyps. Dr. Leone Payor.  Due now 2013   POLYPECTOMY     Family History Family History  Problem Relation Age of Onset   Heart disease Mother    Hypertension Mother    Diabetes Mother    Hyperlipidemia Mother    Hypertension Father    Heart disease Father    Hypertension Brother    Cancer Sister        breast cancer   Breast cancer Sister    Heart attack Other    Colon cancer Neg Hx    Esophageal cancer Neg Hx    Rectal cancer Neg Hx    Stomach cancer Neg Hx     Social History Social History   Tobacco Use   Smoking status: Never   Smokeless tobacco: Never  Vaping Use   Vaping status: Never Used  Substance Use Topics   Alcohol use: No   Drug use: No   Allergies Hydrocodone-acetaminophen and Hydrocodone  Review of Systems Review of Systems  Respiratory:  Positive for cough.     Physical Exam Vital Signs  I have reviewed the triage vital signs BP (!) 180/74   Pulse 74   Temp 98 F (36.7 C) (Oral)   Resp 18   Ht 5\' 9"  (1.753 m)   Wt 70.8 kg   SpO2 96%   BMI 23.04 kg/m   Physical Exam Constitutional:      General: He is not in acute distress.    Appearance: Normal appearance.  HENT:     Head: Normocephalic and atraumatic.     Nose: No congestion or rhinorrhea.  Eyes:     General:        Right eye: No discharge.  Left eye: No discharge.     Extraocular Movements: Extraocular movements intact.     Pupils: Pupils  are equal, round, and reactive to light.  Cardiovascular:     Rate and Rhythm: Normal rate and regular rhythm.     Heart sounds: No murmur heard. Pulmonary:     Effort: No respiratory distress.     Breath sounds: Wheezing and rales present.  Abdominal:     General: There is no distension.     Tenderness: There is no abdominal tenderness.  Musculoskeletal:        General: Normal range of motion.     Cervical back: Normal range of motion.  Skin:    General: Skin is warm and dry.  Neurological:     General: No focal deficit present.     Mental Status: He is alert.     ED Results and Treatments Labs (all labs ordered are listed, but only abnormal results are displayed) Labs Reviewed  RESP PANEL BY RT-PCR (RSV, FLU A&B, COVID)  RVPGX2 - Abnormal; Notable for the following components:      Result Value   Influenza A by PCR POSITIVE (*)    All other components within normal limits  CBC WITH DIFFERENTIAL/PLATELET - Abnormal; Notable for the following components:   WBC 11.2 (*)    RBC 4.02 (*)    Hemoglobin 12.4 (*)    HCT 38.2 (*)    Platelets 125 (*)    Neutro Abs 9.6 (*)    Lymphs Abs 0.5 (*)    All other components within normal limits  COMPREHENSIVE METABOLIC PANEL - Abnormal; Notable for the following components:   Sodium 134 (*)    Glucose, Bld 132 (*)    BUN 32 (*)    Creatinine, Ser 1.68 (*)    Albumin 3.3 (*)    GFR, Estimated 40 (*)    All other components within normal limits  URINALYSIS, ROUTINE W REFLEX MICROSCOPIC - Abnormal; Notable for the following components:   APPearance HAZY (*)    Protein, ur 30 (*)    Bacteria, UA RARE (*)    All other components within normal limits                                                                                                                          Radiology DG Chest 2 View Result Date: 09/29/2023 CLINICAL DATA:  Dyspnea EXAM: CHEST - 2 VIEW COMPARISON:  09/22/2023 FINDINGS: The heart size and mediastinal  contours are within normal limits. Aortic atherosclerosis. Interval development of patchy airspace opacity within the right upper lobe. No pleural effusion or pneumothorax. The visualized skeletal structures are unremarkable. IMPRESSION: Interval development of patchy airspace opacity within the right upper lobe, suspicious for pneumonia. Electronically Signed   By: Duanne Guess D.O.   On: 09/29/2023 21:08    Pertinent labs & imaging results that were available during my care of the patient were reviewed by me and considered  in my medical decision making (see MDM for details).  Medications Ordered in ED Medications  ipratropium-albuterol (DUONEB) 0.5-2.5 (3) MG/3ML nebulizer solution 9 mL (9 mLs Nebulization Given 09/29/23 2107)  amoxicillin-clavulanate (AUGMENTIN) 875-125 MG per tablet 1 tablet (1 tablet Oral Given 09/29/23 2159)  fosfomycin (MONUROL) packet 3 g (3 g Oral Given 09/29/23 2159)                                                                                                                                     Procedures Procedures  (including critical care time)  Medical Decision Making / ED Course   This patient presents to the ED for concern of cough, hypoxia, this involves an extensive number of treatment options, and is a complaint that carries with it a high risk of complications and morbidity.  The differential diagnosis includes Pe, PTX, Pulmonary Edema, ARDS, COPD/Asthma, ACS, CHF exacerbation, Arrhythmia, Pericardial Effusion/Tamponade, Anemia, Sepsis, Acidosis/Hypercapnia, Anxiety, Viral URI  MDM: Patient seen emergency room for evaluation of cough and reported hypoxia.  Physical exam with wheezing and rales bilaterally but no accessory muscle use or tachypnea.  Patient is not hypoxic here in the emergency department.  Laboratory evaluation with leukocytosis to 11.2, hemoglobin 12.4, platelet count 125, BUN 32, creatinine 1.68 which is a mild increase for this patient.   He is influenza positive and chest x-ray is concerning for pneumonia.  Patient given 3 DuoNebs with improvement of his wheezing.  In regards to the patient's antibiotic regimen, he is on Keflex which would not provide adequate pulmonary coverage but he still has 3 days left of this medication.  As this is a q6h medication, in order to ensure compliance and makes taking his antibiotics easier, we will complete his UTI course today with a single dose of fosfomycin and start patient on Augmentin.  An ambulatory pulse ox was performed and patient did not have any hypoxia with exertion either.  At this time he does not meet inpatient criteria for admission and will be discharged with outpatient follow-up.  He was given strict return precautions of which he and his wife voiced understanding.   Additional history obtained: -Additional history obtained from wife -External records from outside source obtained and reviewed including: Chart review including previous notes, labs, imaging, consultation notes   Lab Tests: -I ordered, reviewed, and interpreted labs.   The pertinent results include:   Labs Reviewed  RESP PANEL BY RT-PCR (RSV, FLU A&B, COVID)  RVPGX2 - Abnormal; Notable for the following components:      Result Value   Influenza A by PCR POSITIVE (*)    All other components within normal limits  CBC WITH DIFFERENTIAL/PLATELET - Abnormal; Notable for the following components:   WBC 11.2 (*)    RBC 4.02 (*)    Hemoglobin 12.4 (*)    HCT 38.2 (*)    Platelets 125 (*)    Neutro Abs 9.6 (*)  Lymphs Abs 0.5 (*)    All other components within normal limits  COMPREHENSIVE METABOLIC PANEL - Abnormal; Notable for the following components:   Sodium 134 (*)    Glucose, Bld 132 (*)    BUN 32 (*)    Creatinine, Ser 1.68 (*)    Albumin 3.3 (*)    GFR, Estimated 40 (*)    All other components within normal limits  URINALYSIS, ROUTINE W REFLEX MICROSCOPIC - Abnormal; Notable for the following  components:   APPearance HAZY (*)    Protein, ur 30 (*)    Bacteria, UA RARE (*)    All other components within normal limits      Imaging Studies ordered: I ordered imaging studies including chest x-ray I independently visualized and interpreted imaging. I agree with the radiologist interpretation   Medicines ordered and prescription drug management: Meds ordered this encounter  Medications   ipratropium-albuterol (DUONEB) 0.5-2.5 (3) MG/3ML nebulizer solution 9 mL   amoxicillin-clavulanate (AUGMENTIN) 875-125 MG per tablet 1 tablet   fosfomycin (MONUROL) packet 3 g   DISCONTD: dicyclomine (BENTYL) 20 MG tablet    Sig: Take 1 tablet (20 mg total) by mouth 2 (two) times daily as needed for spasms.    Dispense:  20 tablet    Refill:  0   DISCONTD: lidocaine (LIDODERM) 5 %    Sig: Place 1 patch onto the skin daily. Remove & Discard patch within 12 hours or as directed by MD    Dispense:  30 patch    Refill:  0   amoxicillin-clavulanate (AUGMENTIN) 875-125 MG tablet    Sig: Take 1 tablet by mouth every 12 (twelve) hours.    Dispense:  14 tablet    Refill:  0    -I have reviewed the patients home medicines and have made adjustments as needed  Critical interventions none  Cardiac Monitoring: The patient was maintained on a cardiac monitor.  I personally viewed and interpreted the cardiac monitored which showed an underlying rhythm of: NSR  Social Determinants of Health:  Factors impacting patients care include: none   Reevaluation: After the interventions noted above, I reevaluated the patient and found that they have :improved  Co morbidities that complicate the patient evaluation  Past Medical History:  Diagnosis Date   Allergy    Anxiety    mild new dx   Arthritis    Basal cell carcinoma    Cataract    removed both eyes   Edema    Essential hypertension, benign    GERD (gastroesophageal reflux disease)    Hearing loss    Hx of prostatitis    Malignant  neoplasm of tonsil (HCC) 10/03/12 bx   Tonsil =positive for p16(HR HPV MARKER)    sQUAMOUS CELL CARCINOMA   Metastasis to lymph nodes (HCC) Pet Scan 10/26/12   Right Level IIa abd Level III Lymph Nodes   Mixed hyperlipidemia    Mucositis 12/04/2012   Neuromuscular disorder (HCC)    b/l tremors,    Personal history of adenomatous colonic polyps 07/23/2012   2010   Renal insufficiency 12/16/2012   Renal insufficiency 12/16/2012   S/P radiation therapy 11/13/2012-12/29/2012     Right tonsil/ bilateral neck/ total dose 70 Gy in 35 fractions   Status post chemotherapy    concurrent chemo with q3 weeks Cisplatin and daily radiation between 11/13/2012 and 12/29/2012   Thrush, oral 05/15/2013   Unspecified sinusitis (chronic)       Dispostion: I considered admission for  this patient, but at this time he does not meet inpatient criteria for admission and will be discharged with outpatient follow-up     Final Clinical Impression(s) / ED Diagnoses Final diagnoses:  Pneumonia due to infectious organism, unspecified laterality, unspecified part of lung     @PCDICTATION @    Glendora Score, MD 09/30/23 1207

## 2023-10-02 DIAGNOSIS — R55 Syncope and collapse: Secondary | ICD-10-CM

## 2023-10-02 DIAGNOSIS — R42 Dizziness and giddiness: Secondary | ICD-10-CM | POA: Diagnosis not present

## 2023-10-07 DIAGNOSIS — J189 Pneumonia, unspecified organism: Secondary | ICD-10-CM | POA: Diagnosis not present

## 2023-10-12 ENCOUNTER — Telehealth: Payer: Self-pay | Admitting: Cardiovascular Disease

## 2023-10-12 MED ORDER — ROSUVASTATIN CALCIUM 5 MG PO TABS: 5.0000 mg | ORAL_TABLET | Freq: Every day | ORAL | 3 refills | Status: DC

## 2023-10-12 NOTE — Telephone Encounter (Signed)
Pt's medication was sent to pt's pharmacy as requested. Confirmation received.

## 2023-10-12 NOTE — Telephone Encounter (Signed)
*  STAT* If patient is at the pharmacy, call can be transferred to refill team.   1. Which medications need to be refilled? (please list name of each medication and dose if known) rosuvastatin (CRESTOR) 5 MG tablet   2. Which pharmacy/location (including street and city if local pharmacy) is medication to be sent to? [facility] - [place], [place] - [address]   3. Do they need a 30 day or 90 day supply? 90   Patient only has one day of this medication left.

## 2023-10-16 DIAGNOSIS — R42 Dizziness and giddiness: Secondary | ICD-10-CM | POA: Diagnosis not present

## 2023-10-16 DIAGNOSIS — R55 Syncope and collapse: Secondary | ICD-10-CM | POA: Diagnosis not present

## 2023-10-21 DIAGNOSIS — J189 Pneumonia, unspecified organism: Secondary | ICD-10-CM | POA: Diagnosis not present

## 2023-10-21 DIAGNOSIS — L720 Epidermal cyst: Secondary | ICD-10-CM | POA: Diagnosis not present

## 2023-10-21 DIAGNOSIS — L02412 Cutaneous abscess of left axilla: Secondary | ICD-10-CM | POA: Diagnosis not present

## 2023-10-26 NOTE — Progress Notes (Unsigned)
 Cardiology Office Note    Patient Name: Omar Sawyer Date of Encounter: 10/28/2023  Primary Care Provider:  Elie Confer, NP Primary Cardiologist:  Donato Schultz, MD Primary Electrophysiologist: None   Past Medical History    Past Medical History:  Diagnosis Date   Allergy    Anxiety    mild new dx   Arthritis    Basal cell carcinoma    Cataract    removed both eyes   Edema    Essential hypertension, benign    GERD (gastroesophageal reflux disease)    Hearing loss    Hx of prostatitis    Malignant neoplasm of tonsil (HCC) 10/03/12 bx   Tonsil =positive for p16(HR HPV MARKER)    sQUAMOUS CELL CARCINOMA   Metastasis to lymph nodes (HCC) Pet Scan 10/26/12   Right Level IIa abd Level III Lymph Nodes   Mixed hyperlipidemia    Mucositis 12/04/2012   Neuromuscular disorder (HCC)    b/l tremors,    Personal history of adenomatous colonic polyps 07/23/2012   2010   Renal insufficiency 12/16/2012   Renal insufficiency 12/16/2012   S/P radiation therapy 11/13/2012-12/29/2012     Right tonsil/ bilateral neck/ total dose 70 Gy in 35 fractions   Status post chemotherapy    concurrent chemo with q3 weeks Cisplatin and daily radiation between 11/13/2012 and 12/29/2012   Thrush, oral 05/15/2013   Unspecified sinusitis (chronic)     History of Present Illness  Omar Sawyer is a 83 y.o. male with a PMH of essential HTN, orthostatic hypotension, GERD, HLD, tonsillar cancer s/p radiation and chemotherapy, carotid artery disease (RICA 40-59% and LICA 1-39%) who presents today for 1 month follow-up.  Mr. Dimmer was last seen on 09/28/2023 following ED visit for elevated BP.  He was found to have elevated TSH levels and blood pressure was erratic and during follow-up BP remained elevated.  He also reported a recent fall and struck the back of his head.  He was also noted to have some postural hypotension and wore a ZIO monitor for further evaluation.  He was noted to have persistent cough and was  advised to follow-up in the ED for evaluation.  He reported to the ED and was found to have hypoxia and Rales with chest x-ray completed concerning for pneumonia and was positive for influenza.  He was treated with DuoNebs for wheezing.  He completed an ambulatory pulse ox without any desaturation.  He was discharged and started on Augmentin in stable condition.  Mr. Priscille Kluver presents today for follow-up for hypertension.  During today's visit his blood pressure was elevated at 200/88 182/82 on recheck.  He was seen recently in the ED for management of pneumonia and losartan was increased to 50 mg twice daily. He reports that a subsequent chest x-ray showed improvement, but the pneumonia was still present. The patient has been managing his hypertension with losartan and carvedilol, but his blood pressure has remained consistently high. He denies any recent fainting spells, which had been a concern in the past. The patient also reports feeling tired, which he attributes to fighting off the pneumonia. He reports a marginal appetite but is consuming enough protein. He also mentions a history of chemotherapy and resultant kidney damage.  Today's visit we reviewed the results of his ZIO monitor and patient reported no further episodes of presyncope. Patient denies chest pain, palpitations, dyspnea, PND, orthopnea, nausea, vomiting, dizziness, syncope, edema, weight gain, or early satiety.  Review of Systems  Please see the history of present illness.    All other systems reviewed and are otherwise negative except as noted above.  Physical Exam    Wt Readings from Last 3 Encounters:  10/28/23 156 lb (70.8 kg)  09/29/23 156 lb (70.8 kg)  09/28/23 157 lb 12.8 oz (71.6 kg)   VS: Vitals:   10/28/23 0941 10/28/23 1018  BP: (!) 200/88 (!) 182/82  Pulse: 68   SpO2: 98%   ,Body mass index is 23.04 kg/m. GEN: Well nourished, well developed in no acute distress Neck: No JVD; No carotid bruits Pulmonary: Clear  to auscultation without rales, wheezing or rhonchi  Cardiovascular: Normal rate. Regular rhythm. Normal S1. Normal S2.   Murmurs: There is no murmur.  ABDOMEN: Soft, non-tender, non-distended EXTREMITIES:  No edema; No deformity   EKG/LABS/ Recent Cardiac Studies   ECG personally reviewed by me today -none completed today  Risk Assessment/Calculations:          Lab Results  Component Value Date   WBC 11.2 (H) 09/29/2023   HGB 12.4 (L) 09/29/2023   HCT 38.2 (L) 09/29/2023   MCV 95.0 09/29/2023   PLT 125 (L) 09/29/2023   Lab Results  Component Value Date   CREATININE 1.68 (H) 09/29/2023   BUN 32 (H) 09/29/2023   NA 134 (L) 09/29/2023   K 4.4 09/29/2023   CL 99 09/29/2023   CO2 26 09/29/2023   Lab Results  Component Value Date   CHOL 92 (L) 06/28/2023   HDL 35 (L) 06/28/2023   LDLCALC 39 06/28/2023   LDLDIRECT 70.0 10/17/2014   TRIG 92 06/28/2023   CHOLHDL 2.6 06/28/2023    No results found for: "HGBA1C" Assessment & Plan    1.  Essential HTN: -HYPERTENSION CONTROL Vitals:   10/28/23 0941 10/28/23 1018  BP: (!) 200/88 (!) 182/82    The patient's blood pressure is elevated above target today.  In order to address the patient's elevated BP: Blood pressure will be monitored at home to determine if medication changes need to be made.; A current anti-hypertensive medication was adjusted today.     -Patient will continue losartan 100 mg daily and will increase carvedilol to 12.5 mg twice daily -Will check blood pressures over the next 2 weeks and if remaining elevated we will refer to hypertension clinic for further evaluation. -We will check BMET today  2.  Presyncope: -Previous ZIO monitor completed due to complaint of syncope no evidence of arrhythmia or pauses -Today patient reports no further episodes of syncope or presyncope   3.Bilateral carotid artery stenosis: -RICA 40-59% and LICA 1-39% with annual surveillance and no progression since previous scan on  02/2023 with repeat scan scheduled for 02/2024  4.Pneumonia Recent diagnosis with improvement on Augmentin. Residual consolidation noted on auscultation. -Continue current antibiotic regimen. -Follow up with primary care provider as planned.  5.  History of renal insufficiency: -Currently followed by nephrology -Losartan recently increased to 100 mg and BMET to be completed today  Disposition: Follow-up with Donato Schultz, MD or APP in 8 months    Signed, Napoleon Form, Leodis Rains, NP 10/28/2023, 10:18 AM [facility]

## 2023-10-28 ENCOUNTER — Ambulatory Visit: Payer: Medicare Other | Attending: Nurse Practitioner | Admitting: Nurse Practitioner

## 2023-10-28 ENCOUNTER — Encounter: Payer: Self-pay | Admitting: Nurse Practitioner

## 2023-10-28 VITALS — BP 182/82 | HR 68 | Ht 69.0 in | Wt 156.0 lb

## 2023-10-28 DIAGNOSIS — I10 Essential (primary) hypertension: Secondary | ICD-10-CM | POA: Diagnosis not present

## 2023-10-28 DIAGNOSIS — E782 Mixed hyperlipidemia: Secondary | ICD-10-CM | POA: Diagnosis not present

## 2023-10-28 DIAGNOSIS — I6523 Occlusion and stenosis of bilateral carotid arteries: Secondary | ICD-10-CM | POA: Diagnosis not present

## 2023-10-28 DIAGNOSIS — J189 Pneumonia, unspecified organism: Secondary | ICD-10-CM

## 2023-10-28 DIAGNOSIS — N289 Disorder of kidney and ureter, unspecified: Secondary | ICD-10-CM

## 2023-10-28 MED ORDER — LOSARTAN POTASSIUM 100 MG PO TABS: 100.0000 mg | ORAL_TABLET | Freq: Every day | ORAL | 3 refills | Status: DC

## 2023-10-28 MED ORDER — CARVEDILOL 12.5 MG PO TABS: 12.5000 mg | ORAL_TABLET | Freq: Two times a day (BID) | ORAL | 3 refills | Status: DC

## 2023-10-28 NOTE — Patient Instructions (Addendum)
 Medication Instructions:  Your physician has recommended you make the following change in your medication:  CONTINUE Losartan 100 mg taking 1 daily  INCREASE the Carvedilol to 12.5 mg taking 1 twice a day  *If you need a refill on your cardiac medications before your next appointment, please call your pharmacy*   Lab Work: TODAY:  BMET  If you have labs (blood work) drawn today and your tests are completely normal, you will receive your results only by: MyChart Message (if you have MyChart) OR A paper copy in the mail If you have any lab test that is abnormal or we need to change your treatment, we will call you to review the results.   Testing/Procedures: None ordered   Follow-Up: At [facility], you and your health needs are our priority.  As part of our continuing mission to provide you with exceptional heart care, we have created designated Provider Care Teams.  These Care Teams include your primary Cardiologist (physician) and Advanced Practice Providers (APPs -  Physician Assistants and Nurse Practitioners) who all work together to provide you with the care you need, when you need it.  We recommend signing up for the patient portal called "MyChart".  Sign up information is provided on this After Visit Summary.  MyChart is used to connect with patients for Virtual Visits (Telemedicine).  Patients are able to view lab/test results, encounter notes, upcoming appointments, etc.  Non-urgent messages can be sent to your provider as well.   To learn more about what you can do with MyChart, go to [email or web address].    Your next appointment:   8 month(s)  Provider:   Donato Schultz, MD   Other Instructions Your physician has requested that you regularly monitor and record your blood pressure readings at home. Please use the same machine at the same time of day to check your readings and record them to bring to your follow-up visit.   Please monitor blood pressures and  keep a log of your readings for 2 weeks. Make sure to check 2 hours after your medications.    AVOID these things for 30 minutes before checking your blood pressure: No Drinking caffeine. No Drinking alcohol. No Eating. No Smoking. No Exercising.   Five minutes before checking your blood pressure: Pee. Sit in a dining chair. Avoid sitting in a soft couch or armchair. Be quiet. Do not talk     1st Floor: - Lobby - Registration  - Pharmacy  - Lab - Cafe  2nd Floor: - PV Lab - Diagnostic Testing (echo, CT, nuclear med)  3rd Floor: - Vacant  4th Floor: - TCTS (cardiothoracic surgery) - AFib Clinic - Structural Heart Clinic - Vascular Surgery  - Vascular Ultrasound  5th Floor: - HeartCare Cardiology (general and EP) - Clinical Pharmacy for coumadin, hypertension, lipid, weight-loss medications, and med management appointments    Valet parking services will be available as well.

## 2023-10-29 DIAGNOSIS — L02412 Cutaneous abscess of left axilla: Secondary | ICD-10-CM | POA: Diagnosis not present

## 2023-10-29 DIAGNOSIS — I10 Essential (primary) hypertension: Secondary | ICD-10-CM | POA: Diagnosis not present

## 2023-10-29 DIAGNOSIS — N189 Chronic kidney disease, unspecified: Secondary | ICD-10-CM | POA: Diagnosis not present

## 2023-10-29 LAB — BASIC METABOLIC PANEL
BUN/Creatinine Ratio: 17 (ref 10–24)
BUN: 21 mg/dL (ref 8–27)
CO2: 23 mmol/L (ref 20–29)
Calcium: 9.1 mg/dL (ref 8.6–10.2)
Chloride: 103 mmol/L (ref 96–106)
Creatinine, Ser: 1.25 mg/dL (ref 0.76–1.27)
Glucose: 137 mg/dL — ABNORMAL HIGH (ref 70–99)
Potassium: 4.9 mmol/L (ref 3.5–5.2)
Sodium: 139 mmol/L (ref 134–144)
eGFR: 57 mL/min/{1.73_m2} — ABNORMAL LOW (ref >59)

## 2023-11-01 DIAGNOSIS — L080 Pyoderma: Secondary | ICD-10-CM | POA: Diagnosis not present

## 2023-11-01 DIAGNOSIS — L02432 Carbuncle of left axilla: Secondary | ICD-10-CM | POA: Diagnosis not present

## 2023-11-01 DIAGNOSIS — L910 Hypertrophic scar: Secondary | ICD-10-CM | POA: Diagnosis not present

## 2023-11-04 DIAGNOSIS — H16223 Keratoconjunctivitis sicca, not specified as Sjogren's, bilateral: Secondary | ICD-10-CM | POA: Diagnosis not present

## 2023-11-04 DIAGNOSIS — H35372 Puckering of macula, left eye: Secondary | ICD-10-CM | POA: Diagnosis not present

## 2023-11-04 DIAGNOSIS — H401131 Primary open-angle glaucoma, bilateral, mild stage: Secondary | ICD-10-CM | POA: Diagnosis not present

## 2023-11-04 DIAGNOSIS — H43813 Vitreous degeneration, bilateral: Secondary | ICD-10-CM | POA: Diagnosis not present

## 2023-11-14 ENCOUNTER — Other Ambulatory Visit: Payer: Self-pay | Admitting: Surgery

## 2023-11-14 DIAGNOSIS — L089 Local infection of the skin and subcutaneous tissue, unspecified: Secondary | ICD-10-CM | POA: Diagnosis not present

## 2023-11-14 DIAGNOSIS — L723 Sebaceous cyst: Secondary | ICD-10-CM | POA: Diagnosis not present

## 2023-11-23 NOTE — Progress Notes (Addendum)
 Anesthesia Review:  PCP: Vida Rigger , NP  Cardiologist : Nahser LOV  06/28/23  Ernest DickNP LOV  10/28/23   PPM/ ICD: Device Orders: Rep Notified:  Chest x-ray : 09/29/23- 2 view  EKG : 09/23/23  Monitor- 10/17/23  Echo : Stress test: Cardiac Cath :   Activity level: can do a flight of stairs without difficutoy  Sleep Study/ CPAP : none  Fasting Blood Sugar :      / Checks Blood Sugar -- times a day:    Blood Thinner/ Instructions /Last Dose: ASA / Instructions/ Last Dose :    81 mg aspirin  IN ED on 09/29/23   09/29/23- Positive Flu A  and Pneumonia- At preop on 11/28/23 pt has no symptoms.  Feeling fine.

## 2023-11-23 NOTE — Patient Instructions (Signed)
 SURGICAL WAITING ROOM VISITATION  Patients having surgery or a procedure may have no more than 2 support people in the waiting area - these visitors may rotate.    Children under the age of 67 must have an adult with them who is not the patient.  Due to an increase in RSV and influenza rates and associated hospitalizations, children ages 5 and under may not visit patients in [facility] hospitals.  Visitors with respiratory illnesses are discouraged from visiting and should remain at home.  If the patient needs to stay at the hospital during part of their recovery, the visitor guidelines for inpatient rooms apply. Pre-op nurse will coordinate an appropriate time for 1 support person to accompany patient in pre-op.  This support person may not rotate.    Please refer to the [facility] website for the visitor guidelines for Inpatients (after your surgery is over and you are in a regular room).       Your procedure is scheduled on:  12/07/2023    Report to [facility] Main Entrance    Report to admitting at   0830 AM   Call this number if you have problems the morning of surgery [phone]   Do not eat food :After Midnight.   After Midnight you may have the following liquids until _ 0730_____ AM DAY OF SURGERY  Water Non-Citrus Juices (without pulp, NO RED-Apple, White grape, White cranberry) Black Coffee (NO MILK/CREAM OR CREAMERS, sugar ok)  Clear Tea (NO MILK/CREAM OR CREAMERS, sugar ok) regular and decaf                             Plain Jell-O (NO RED)                                           Fruit ices (not with fruit pulp, NO RED)                                     Popsicles (NO RED)                                                               Sports drinks like Gatorade (NO RED)                  The day of surgery:  Drink ONE (1) Pre-Surgery Clear Ensure or G2 at  0730AM  ( have completed by ) the morning of surgery. Drink in one sitting. Do not sip.  This  drink was given to you during your hospital  pre-op appointment visit. Nothing else to drink after completing the  Pre-Surgery Clear Ensure or G2.          If you have questions, please contact your surgeon's office.       Oral Hygiene is also important to reduce your risk of infection.  Remember - BRUSH YOUR TEETH THE MORNING OF SURGERY WITH YOUR REGULAR TOOTHPASTE  DENTURES WILL BE REMOVED PRIOR TO SURGERY PLEASE DO NOT APPLY "Poly grip" OR ADHESIVES!!!   Do NOT smoke after Midnight   Stop all vitamins and herbal supplements 7 days before surgery.   Take these medicines the morning of surgery with A SIP OF WATER:  coreg, omeprazole, flomax, eye drops as usual   DO NOT TAKE ANY ORAL DIABETIC MEDICATIONS DAY OF YOUR SURGERY  Bring CPAP mask and tubing day of surgery.                              You may not have any metal on your body including hair pins, jewelry, and body piercing             Do not wear make-up, lotions, powders, perfumes/cologne, or deodorant  Do not wear nail polish including gel and S&S, artificial/acrylic nails, or any other type of covering on natural nails including finger and toenails. If you have artificial nails, gel coating, etc. that needs to be removed by a nail salon please have this removed prior to surgery or surgery may need to be canceled/ delayed if the surgeon/ anesthesia feels like they are unable to be safely monitored.   Do not shave  48 hours prior to surgery.               Men may shave face and neck.   Do not bring valuables to the hospital. [facility] IS NOT             RESPONSIBLE   FOR VALUABLES.   Contacts, glasses, dentures or bridgework may not be worn into surgery.   Bring small overnight bag day of surgery.   DO NOT BRING YOUR HOME MEDICATIONS TO THE HOSPITAL. PHARMACY WILL DISPENSE MEDICATIONS LISTED ON YOUR MEDICATION LIST TO YOU DURING YOUR ADMISSION IN THE HOSPITAL!    Patients  discharged on the day of surgery will not be allowed to drive home.  Someone NEEDS to stay with you for the first 24 hours after anesthesia.   Special Instructions: Bring a copy of your healthcare power of attorney and living will documents the day of surgery if you haven't scanned them before.              Please read over the following fact sheets you were given: IF YOU HAVE QUESTIONS ABOUT YOUR PRE-OP INSTRUCTIONS PLEASE CALL [phone]   If you received a COVID test during your pre-op visit  it is requested that you wear a mask when out in public, stay away from anyone that may not be feeling well and notify your surgeon if you develop symptoms. If you test positive for Covid or have been in contact with anyone that has tested positive in the last 10 days please notify you surgeon.    [facility] - Preparing for Surgery Before surgery, you can play an important role.  Because skin is not sterile, your skin needs to be as free of germs as possible.  You can reduce the number of germs on your skin by washing with CHG (chlorahexidine gluconate) soap before surgery.  CHG is an antiseptic cleaner which kills germs and bonds with the skin to continue killing germs even after washing. Please DO NOT use if you have an allergy to CHG or antibacterial soaps.  If your skin becomes reddened/irritated stop using the CHG  and inform your nurse when you arrive at Short Stay. Do not shave (including legs and underarms) for at least 48 hours prior to the first CHG shower.  You may shave your face/neck. Please follow these instructions carefully:  1.  Shower with CHG Soap the night before surgery and the  morning of Surgery.  2.  If you choose to wash your hair, wash your hair first as usual with your  normal  shampoo.  3.  After you shampoo, rinse your hair and body thoroughly to remove the  shampoo.                           4.  Use CHG as you would any other liquid soap.  You can apply chg directly  to the  skin and wash                       Gently with a scrungie or clean washcloth.  5.  Apply the CHG Soap to your body ONLY FROM THE NECK DOWN.   Do not use on face/ open                           Wound or open sores. Avoid contact with eyes, ears mouth and genitals (private parts).                       Wash face,  Genitals (private parts) with your normal soap.             6.  Wash thoroughly, paying special attention to the area where your surgery  will be performed.  7.  Thoroughly rinse your body with warm water from the neck down.  8.  DO NOT shower/wash with your normal soap after using and rinsing off  the CHG Soap.                9.  Pat yourself dry with a clean towel.            10.  Wear clean pajamas.            11.  Place clean sheets on your bed the night of your first shower and do not  sleep with pets. Day of Surgery : Do not apply any lotions/deodorants the morning of surgery.  Please wear clean clothes to the hospital/surgery center.  FAILURE TO FOLLOW THESE INSTRUCTIONS MAY RESULT IN THE CANCELLATION OF YOUR SURGERY PATIENT SIGNATURE_________________________________  NURSE SIGNATURE__________________________________  ________________________________________________________________________

## 2023-11-24 DIAGNOSIS — N401 Enlarged prostate with lower urinary tract symptoms: Secondary | ICD-10-CM | POA: Diagnosis not present

## 2023-11-24 DIAGNOSIS — N3020 Other chronic cystitis without hematuria: Secondary | ICD-10-CM | POA: Diagnosis not present

## 2023-11-24 DIAGNOSIS — R351 Nocturia: Secondary | ICD-10-CM | POA: Diagnosis not present

## 2023-11-24 DIAGNOSIS — R972 Elevated prostate specific antigen [PSA]: Secondary | ICD-10-CM | POA: Diagnosis not present

## 2023-11-28 ENCOUNTER — Other Ambulatory Visit: Payer: Self-pay

## 2023-11-28 ENCOUNTER — Encounter (HOSPITAL_COMMUNITY): Payer: Self-pay

## 2023-11-28 ENCOUNTER — Encounter (HOSPITAL_COMMUNITY)
Admission: RE | Admit: 2023-11-28 | Discharge: 2023-11-28 | Disposition: A | Discharge status: Home / Self Care | Source: Ambulatory Visit | Attending: Surgery | Admitting: Surgery

## 2023-11-28 VITALS — BP 180/79 | HR 68 | Temp 97.7°F | Resp 16 | Ht 68.0 in | Wt 159.0 lb

## 2023-11-28 DIAGNOSIS — M1990 Unspecified osteoarthritis, unspecified site: Secondary | ICD-10-CM | POA: Diagnosis not present

## 2023-11-28 DIAGNOSIS — L089 Local infection of the skin and subcutaneous tissue, unspecified: Secondary | ICD-10-CM | POA: Insufficient documentation

## 2023-11-28 DIAGNOSIS — I2510 Atherosclerotic heart disease of native coronary artery without angina pectoris: Secondary | ICD-10-CM | POA: Diagnosis not present

## 2023-11-28 DIAGNOSIS — D631 Anemia in chronic kidney disease: Secondary | ICD-10-CM | POA: Insufficient documentation

## 2023-11-28 DIAGNOSIS — K219 Gastro-esophageal reflux disease without esophagitis: Secondary | ICD-10-CM | POA: Diagnosis not present

## 2023-11-28 DIAGNOSIS — Z01812 Encounter for preprocedural laboratory examination: Secondary | ICD-10-CM | POA: Insufficient documentation

## 2023-11-28 DIAGNOSIS — I129 Hypertensive chronic kidney disease with stage 1 through stage 4 chronic kidney disease, or unspecified chronic kidney disease: Secondary | ICD-10-CM | POA: Insufficient documentation

## 2023-11-28 DIAGNOSIS — N189 Chronic kidney disease, unspecified: Secondary | ICD-10-CM | POA: Insufficient documentation

## 2023-11-28 DIAGNOSIS — Z01818 Encounter for other preprocedural examination: Secondary | ICD-10-CM | POA: Diagnosis not present

## 2023-11-28 DIAGNOSIS — Z923 Personal history of irradiation: Secondary | ICD-10-CM | POA: Diagnosis not present

## 2023-11-28 DIAGNOSIS — L723 Sebaceous cyst: Secondary | ICD-10-CM | POA: Diagnosis not present

## 2023-11-28 DIAGNOSIS — R251 Tremor, unspecified: Secondary | ICD-10-CM | POA: Insufficient documentation

## 2023-11-28 DIAGNOSIS — Z85818 Personal history of malignant neoplasm of other sites of lip, oral cavity, and pharynx: Secondary | ICD-10-CM | POA: Insufficient documentation

## 2023-11-28 DIAGNOSIS — Z9221 Personal history of antineoplastic chemotherapy: Secondary | ICD-10-CM | POA: Diagnosis not present

## 2023-11-28 DIAGNOSIS — F419 Anxiety disorder, unspecified: Secondary | ICD-10-CM | POA: Insufficient documentation

## 2023-11-28 HISTORY — DX: Other complications of anesthesia, initial encounter: T88.59XA

## 2023-11-28 HISTORY — DX: Pneumonia, unspecified organism: J18.9

## 2023-11-28 LAB — CBC
HCT: 36.1 % — ABNORMAL LOW (ref 39.0–52.0)
Hemoglobin: 11.6 g/dL — ABNORMAL LOW (ref 13.0–17.0)
MCH: 30.9 pg (ref 26.0–34.0)
MCHC: 32.1 g/dL (ref 30.0–36.0)
MCV: 96.3 fL (ref 80.0–100.0)
Platelets: 154 10*3/uL (ref 150–400)
RBC: 3.75 MIL/uL — ABNORMAL LOW (ref 4.22–5.81)
RDW: 14.1 % (ref 11.5–15.5)
WBC: 7 10*3/uL (ref 4.0–10.5)
nRBC: 0 % (ref 0.0–0.2)

## 2023-11-28 LAB — BASIC METABOLIC PANEL WITH GFR
Anion gap: 7 (ref 5–15)
BUN: 22 mg/dL (ref 8–23)
CO2: 24 mmol/L (ref 22–32)
Calcium: 8.5 mg/dL — ABNORMAL LOW (ref 8.9–10.3)
Chloride: 106 mmol/L (ref 98–111)
Creatinine, Ser: 1.38 mg/dL — ABNORMAL HIGH (ref 0.61–1.24)
GFR, Estimated: 51 mL/min — ABNORMAL LOW (ref >60)
Glucose, Bld: 164 mg/dL — ABNORMAL HIGH (ref 70–99)
Potassium: 4.2 mmol/L (ref 3.5–5.1)
Sodium: 137 mmol/L (ref 135–145)

## 2023-11-30 ENCOUNTER — Encounter (HOSPITAL_COMMUNITY): Payer: Self-pay

## 2023-11-30 NOTE — Anesthesia Preprocedure Evaluation (Addendum)
 Anesthesia Evaluation  Patient identified by MRN, date of birth, ID band Patient awake    Reviewed: Allergy & Precautions, NPO status , Patient's Chart, lab work & pertinent test results, reviewed documented beta blocker date and time   History of Anesthesia Complications Negative for: history of anesthetic complications  Airway Mallampati: II  TM Distance: >3 FB Neck ROM: Limited    Dental no notable dental hx.    Pulmonary neg pulmonary ROS   Pulmonary exam normal        Cardiovascular hypertension, Pt. on medications and Pt. on home beta blockers Normal cardiovascular exam     Neuro/Psych   Anxiety     negative neurological ROS     GI/Hepatic Neg liver ROS,GERD  Medicated,,  Endo/Other  negative endocrine ROS    Renal/GU Renal InsufficiencyRenal disease (Cr 1.38)     Musculoskeletal  (+) Arthritis ,    Abdominal   Peds  Hematology  (+) Blood dyscrasia (Hgb 11.6), anemia   Anesthesia Other Findings Right tonsillar ca s/p neck radiation 2014, CHRONICALLY INFECTED LEFT AXILLARY SEBACEOUS CYST  Reproductive/Obstetrics                              Anesthesia Physical Anesthesia Plan  ASA: 2  Anesthesia Plan: General   Post-op Pain Management: Tylenol PO (pre-op)*   Induction: Intravenous  PONV Risk Score and Plan: 2 and Treatment may vary due to age or medical condition, Ondansetron and Dexamethasone  Airway Management Planned: LMA  Additional Equipment: None  Intra-op Plan:   Post-operative Plan: Extubation in OR  Informed Consent: I have reviewed the patients History and Physical, chart, labs and discussed the procedure including the risks, benefits and alternatives for the proposed anesthesia with the patient or authorized representative who has indicated his/her understanding and acceptance.     Dental advisory given  Plan Discussed with: CRNA  Anesthesia Plan  Comments: (See PAT note from 3/31)         Anesthesia Quick Evaluation

## 2023-11-30 NOTE — Progress Notes (Signed)
 Case: [id number] Date/Time: 12/07/23 1015   Procedure: EXCISION MASS UPPER EXTREMITIES (Left) - EXCISION LEFT AXILLARY CHRONICALLY INFECTED SEBACEOUS CYST   Anesthesia type: Choice   Diagnosis: Infected sebaceous cyst [L72.3, L08.9]   Pre-op diagnosis: CHRONICALLY INFECTED LEFT AXILLARY SEBACEOUS CYST   Location: [facility] ROOM 01 / [facility]   Surgeons: Abigail Miyamoto, MD       DISCUSSION: Omar Sawyer is an 83 yo male who presents to PAT prior to surgery above. PMH of HTN, carotid artery disease, tremors, GERD, CKD, anemia, hx of tonsil cancer s/p chemo and XRT (2014), anxiety, arthritis  Prior complication from anesthesia includes hiccups  Patient follows with Cardiology for hx of HTN, HLD, and carotid artery disease. Last seen on 10/28/23. BP was persistently high and dosage of Coreg was increased. He was advised to keep a record of BP and he would be referred to HTN clinic if persistently elevated. Carotid US in 03/01/23 showed 60-79% stenosis of the R ICA and 1-39% of L ICA.  Patient was diagnosed with the flu and pneumonia on 09/29/23. At PAT visit he reports feeling recovered.  VS: BP (!) 180/79   Pulse 68   Temp 36.5 C (Oral)   Resp 16   Ht 5\' 8"  (1.727 m)   Wt 72.1 kg   SpO2 100%   BMI 24.18 kg/m   PROVIDERS: Elie Confer, NP Cardiology: Kristeen Miss, MD  LABS: Labs reviewed: Acceptable for surgery. CKD, anemia stable (all labs ordered are listed, but only abnormal results are displayed)  Labs Reviewed  CBC - Abnormal; Notable for the following components:      Result Value   RBC 3.75 (*)    Hemoglobin 11.6 (*)    HCT 36.1 (*)    All other components within normal limits  BASIC METABOLIC PANEL WITH GFR - Abnormal; Notable for the following components:   Glucose, Bld 164 (*)    Creatinine, Ser 1.38 (*)    Calcium 8.5 (*)    GFR, Estimated 51 (*)    All other components within normal limits     IMAGES:  CXR 09/29/23:  FINDINGS: The heart size and  mediastinal contours are within normal limits. Aortic atherosclerosis. Interval development of patchy airspace opacity within the right upper lobe. No pleural effusion or pneumothorax. The visualized skeletal structures are unremarkable.   IMPRESSION: Interval development of patchy airspace opacity within the right upper lobe, suspicious for pneumonia.   EKG:   CV:  Cardiac monitor 10/17/23:    Predominant rhythm is sinus.   Rare premature atrial contractions   Rare premature ventricular contractions   No significant arrhythmias observed.      Carotid US 03/21/23:  Summary:  Right Carotid: Velocities in the right ICA are consistent with a 60-79%                 stenosis. The ECA appears >50% stenosed.   Left Carotid: Velocities in the left ICA are consistent with a 1-39%  stenosis.   Vertebrals: Bilateral vertebral arteries demonstrate antegrade flow.  Subclavians: Normal flow hemodynamics were seen in bilateral subclavian               arteries.    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 10/26/12   Right Level IIa abd Level III Lymph Nodes   Mixed hyperlipidemia    Mucositis 12/04/2012   Neuromuscular disorder (HCC)    b/l tremors,    Personal history of adenomatous colonic polyps 07/23/2012   2010   Pneumonia    Renal insufficiency 12/16/2012   Renal insufficiency 12/16/2012   S/P radiation therapy 11/13/2012-12/29/2012     Right tonsil/ bilateral neck/ total dose 70 Gy in 35 fractions   Status post chemotherapy    concurrent chemo with  q3 weeks Cisplatin and daily radiation between 11/13/2012 and 12/29/2012   Thrush, oral 05/15/2013   Unspecified sinusitis (chronic)     Past Surgical History:  Procedure Laterality Date   Biopsy of Right Tonsil Right 10/03/2012   Squamous Cell Carcinoma      BLEPHAROPLASTY     CATARACT EXTRACTION, BILATERAL     CHOLECYSTECTOMY     COLONOSCOPY  2011   polyps. Dr. Leone Payor.  Due now 2013   POLYPECTOMY      MEDICATIONS:  aspirin 81 MG tablet   brimonidine (ALPHAGAN) 0.2 % ophthalmic solution   carvedilol (COREG) 12.5 MG tablet   clotrimazole-betamethasone (LOTRISONE) cream   losartan (COZAAR) 100 MG tablet   Misc Natural Products (ELDERBERRY ZINC/VIT C/IMMUNE MT)   Multiple Vitamin (MULTIVITAMIN) tablet   omeprazole (PRILOSEC) 20 MG capsule   Propylene Glycol (SYSTANE COMPLETE) 0.6 % SOLN   rosuvastatin (CRESTOR) 5 MG tablet   tamsulosin (FLOMAX) 0.4 MG CAPS   timolol (TIMOPTIC) 0.25 % ophthalmic solution   No current facility-administered medications for this encounter.    Marcille Blanco [facility] Surgical Short Stay/Anesthesiology [facility] Phone [phone] 11/30/2023 9:49 AM

## 2023-12-06 NOTE — H&P (Signed)
 REFERRING PHYSICIAN: Tollie Eth, PA PROVIDER: Wayne Both, MD MRN: [id number] DOB: [birth year] DATE OF ENCOUNTER: 11/14/2023 Subjective   Chief Complaint: New Consultation (Recurrent abscess- left axilla)  History of Present Illness: Omar Sawyer. is a 83 y.o. male who is seen today as an office consultation for evaluation of New Consultation (Recurrent abscess- left axilla)  This is an 83 year old gentleman referred here for a chronic abscess in the left axilla. He reports that it appeared several weeks ago. He underwent incision and drainage and was placed on antibiotics and then recurred requiring a second procedure. He has had no previous history of similar infections. He is otherwise without complaints. He now has minimal discomfort in the axilla and is on his last day of antibiotics  Review of Systems: A complete review of systems was obtained from the patient. I have reviewed this information and discussed as appropriate with the patient. See HPI as well for other ROS.  ROS   Medical History: Past Medical History:  Diagnosis Date  Arthritis  History of cancer  Hyperlipidemia  Hypertension   Patient Active Problem List  Diagnosis  (none) - all problems resolved or deleted   Past Surgical History:  Procedure Laterality Date  EYELID SURGERY Bilateral 07/2019  CHOLECYSTECTOMY    Allergies  Allergen Reactions  Hydrocodone-Acetaminophen Nausea And Vomiting   Current Outpatient Medications on File Prior to Visit  Medication Sig Dispense Refill  aspirin 81 MG EC tablet Take 81 mg by mouth once daily  brimonidine (ALPHAGAN) 0.2 % ophthalmic solution  carvediloL (COREG) 12.5 MG tablet Take 12.5 mg by mouth 2 (two) times daily  doxycycline (VIBRAMYCIN) 100 MG capsule Take 100 mg by mouth 2 (two) times daily  ELDERBERRY FRUIT ORAL  latanoprost, PF, 0.005 % Dpet  losartan (COZAAR) 100 MG tablet Take 100 mg by mouth once daily  multivit-min/ferrous fumarate  (MULTI VITAMIN ORAL)  omeprazole (PRILOSEC) 20 MG DR capsule Take 20 mg by mouth once daily  rosuvastatin (CRESTOR) 5 MG tablet  tamsulosin (FLOMAX) 0.4 mg capsule Take 0.4 mg by mouth once daily  timoloL maleate (TIMOPTIC) 0.25 % ophthalmic solution  VYZULTA 0.024 % eye drops   No current facility-administered medications on file prior to visit.   Family History  Problem Relation Age of Onset  Skin cancer Mother  High blood pressure (Hypertension) Mother  High blood pressure (Hypertension) Father  Breast cancer Sister    Social History   Tobacco Use  Smoking Status Never  Smokeless Tobacco Never    Social History   Socioeconomic History  Marital status: Married  Tobacco Use  Smoking status: Never  Smokeless tobacco: Never  Vaping Use  Vaping status: Never Used  Substance and Sexual Activity  Alcohol use: Never  Drug use: Never   Social Drivers of Corporate investment banker Strain: Low Risk (08/22/2020)  Received from [facility]  Overall Financial Resource Strain (CARDIA)  Difficulty of Paying Living Expenses: Not hard at all  Food Insecurity: No Food Insecurity (09/02/2020)  Received from [facility]  Hunger Vital Sign  Worried About Running Out of Food in the Last Year: Never true  Ran Out of Food in the Last Year: Never true  Stress: No Stress Concern Present (08/22/2020)  Received from [facility] - Occupational Stress Questionnaire  Feeling of Stress : Not at all  Received from [facility]  Social Network  Housing Stability: Unknown (11/14/2023)  Housing Stability Vital Sign  Homeless  in the Last Year: No   Objective:   Vitals:  11/14/23 1404  BP: (!) 209/100  Pulse: 71  Temp: 36.6 C (97.8 F)  SpO2: 99%  Weight: 71.1 kg (156 lb 12.8 oz)  Height: 175.3 cm (5\' 9" )  PainSc: 0-No pain  PainLoc: Arm   Body mass index is 23.16 kg/m.  Physical Exam   He appears well on exam  There is a 2 cm  area of chronic induration in the left axilla toward the arm. There is no open draining wound and it is nontender today.  Labs, Imaging and Diagnostic Testing: I have reviewed the notes in the electronic medical records  Assessment and Plan:   Diagnoses and all orders for this visit:  Infected sebaceous cyst of skin of left axilla   I suspect this is a chronically infected sebaceous cyst of the left axilla although I cannot rule out hidradenitis which would be surprising in his age. Regardless, surgical excision of this area is recommended to prevent ongoing infections and for histologic evaluation to rule out a malignancy. I discussed this with the patient and his wife. We discussed the procedure in detail. We discussed the risk which includes but is not limited to bleeding, infection, injury to surrounding structures, recurrence, the need for further procedures, postoperative recovery, etc. They understand and wish to proceed with surgery which will be scheduled

## 2023-12-07 ENCOUNTER — Ambulatory Visit (HOSPITAL_COMMUNITY)
Admission: RE | Admit: 2023-12-07 | Discharge: 2023-12-07 | Disposition: A | Discharge status: Home / Self Care | Source: Ambulatory Visit | Attending: Surgery | Admitting: Surgery

## 2023-12-07 ENCOUNTER — Other Ambulatory Visit: Payer: Self-pay

## 2023-12-07 ENCOUNTER — Ambulatory Visit (HOSPITAL_BASED_OUTPATIENT_CLINIC_OR_DEPARTMENT_OTHER): Payer: Self-pay | Admitting: Anesthesiology

## 2023-12-07 ENCOUNTER — Ambulatory Visit (HOSPITAL_COMMUNITY): Payer: Self-pay | Admitting: Medical

## 2023-12-07 ENCOUNTER — Encounter (HOSPITAL_COMMUNITY): Payer: Self-pay | Admitting: Surgery

## 2023-12-07 ENCOUNTER — Encounter (HOSPITAL_COMMUNITY)
Admission: RE | Disposition: A | Discharge status: Home / Self Care | Payer: Self-pay | Source: Ambulatory Visit | Attending: Surgery

## 2023-12-07 DIAGNOSIS — L720 Epidermal cyst: Secondary | ICD-10-CM | POA: Insufficient documentation

## 2023-12-07 DIAGNOSIS — Z79899 Other long term (current) drug therapy: Secondary | ICD-10-CM | POA: Diagnosis not present

## 2023-12-07 DIAGNOSIS — L02412 Cutaneous abscess of left axilla: Secondary | ICD-10-CM | POA: Insufficient documentation

## 2023-12-07 DIAGNOSIS — L723 Sebaceous cyst: Secondary | ICD-10-CM | POA: Diagnosis present

## 2023-12-07 DIAGNOSIS — K219 Gastro-esophageal reflux disease without esophagitis: Secondary | ICD-10-CM | POA: Insufficient documentation

## 2023-12-07 DIAGNOSIS — Z8249 Family history of ischemic heart disease and other diseases of the circulatory system: Secondary | ICD-10-CM | POA: Diagnosis not present

## 2023-12-07 DIAGNOSIS — N289 Disorder of kidney and ureter, unspecified: Secondary | ICD-10-CM | POA: Insufficient documentation

## 2023-12-07 DIAGNOSIS — M1990 Unspecified osteoarthritis, unspecified site: Secondary | ICD-10-CM | POA: Diagnosis not present

## 2023-12-07 DIAGNOSIS — Z01818 Encounter for other preprocedural examination: Secondary | ICD-10-CM

## 2023-12-07 DIAGNOSIS — L089 Local infection of the skin and subcutaneous tissue, unspecified: Secondary | ICD-10-CM

## 2023-12-07 DIAGNOSIS — I10 Essential (primary) hypertension: Secondary | ICD-10-CM | POA: Insufficient documentation

## 2023-12-07 SURGERY — EXCISION MASS UPPER EXTREMITIES
Anesthesia: General | Laterality: Left

## 2023-12-07 MED ORDER — ONDANSETRON HCL 4 MG/2ML IJ SOLN
INTRAMUSCULAR | Status: DC | PRN
  Administered 2023-12-07: 4 mg via INTRAVENOUS

## 2023-12-07 MED ORDER — EPHEDRINE SULFATE-NACL 50-0.9 MG/10ML-% IV SOSY
PREFILLED_SYRINGE | INTRAVENOUS | Status: DC | PRN
  Administered 2023-12-07: 7.5 mg via INTRAVENOUS

## 2023-12-07 MED ORDER — ENSURE PRE-SURGERY PO LIQD
296.0000 mL | Freq: Once | ORAL | Status: DC
  Filled 2023-12-07: qty 296

## 2023-12-07 MED ORDER — FENTANYL CITRATE (PF) 100 MCG/2ML IJ SOLN
INTRAMUSCULAR | Status: DC | PRN
Start: 2023-12-07 — End: 2023-12-07
  Administered 2023-12-07: 50 ug via INTRAVENOUS

## 2023-12-07 MED ORDER — OXYCODONE HCL 5 MG/5ML PO SOLN: 5.0000 mg | Freq: Once | ORAL | Status: DC | PRN

## 2023-12-07 MED ORDER — PHENYLEPHRINE HCL (PRESSORS) 10 MG/ML IV SOLN: INTRAVENOUS | Status: DC | PRN

## 2023-12-07 MED ORDER — 0.9 % SODIUM CHLORIDE (POUR BTL) OPTIME
TOPICAL | Status: DC | PRN
  Administered 2023-12-07: 1000 mL

## 2023-12-07 MED ORDER — ROCURONIUM BROMIDE 10 MG/ML (PF) SYRINGE
PREFILLED_SYRINGE | INTRAVENOUS | Status: DC | PRN
  Administered 2023-12-07: 60 mg via INTRAVENOUS

## 2023-12-07 MED ORDER — OXYCODONE HCL 5 MG PO TABS: 5.0000 mg | ORAL_TABLET | Freq: Once | ORAL | Status: DC | PRN

## 2023-12-07 MED ORDER — FENTANYL CITRATE (PF) 100 MCG/2ML IJ SOLN
INTRAMUSCULAR | Status: AC
  Filled 2023-12-07: qty 2

## 2023-12-07 MED ORDER — BUPIVACAINE HCL (PF) 0.5 % IJ SOLN
INTRAMUSCULAR | Status: AC
  Filled 2023-12-07: qty 30

## 2023-12-07 MED ORDER — BUPIVACAINE HCL (PF) 0.5 % IJ SOLN
INTRAMUSCULAR | Status: DC | PRN
Start: 2023-12-07 — End: 2023-12-07
  Administered 2023-12-07: 10 mL

## 2023-12-07 MED ORDER — CHLORHEXIDINE GLUCONATE 0.12 % MT SOLN
15.0000 mL | Freq: Once | OROMUCOSAL | Status: AC
  Administered 2023-12-07: 15 mL via OROMUCOSAL

## 2023-12-07 MED ORDER — CHLORHEXIDINE GLUCONATE CLOTH 2 % EX PADS: 6.0000 | MEDICATED_PAD | Freq: Once | CUTANEOUS | Status: DC

## 2023-12-07 MED ORDER — ORAL CARE MOUTH RINSE: 15.0000 mL | Freq: Once | OROMUCOSAL | Status: AC

## 2023-12-07 MED ORDER — LIDOCAINE 2% (20 MG/ML) 5 ML SYRINGE
INTRAMUSCULAR | Status: DC | PRN
Start: 2023-12-07 — End: 2023-12-07
  Administered 2023-12-07: 100 mg via INTRAVENOUS

## 2023-12-07 MED ORDER — PROPOFOL 10 MG/ML IV BOLUS
INTRAVENOUS | Status: DC | PRN
  Administered 2023-12-07: 120 mg via INTRAVENOUS

## 2023-12-07 MED ORDER — DEXAMETHASONE SODIUM PHOSPHATE 10 MG/ML IJ SOLN
INTRAMUSCULAR | Status: DC | PRN
Start: 2023-12-07 — End: 2023-12-07
  Administered 2023-12-07: 10 mg via INTRAVENOUS

## 2023-12-07 MED ORDER — CEFAZOLIN SODIUM-DEXTROSE 2-4 GM/100ML-% IV SOLN
2.0000 g | INTRAVENOUS | Status: AC
Start: 2023-12-07 — End: 2023-12-07
  Administered 2023-12-07: 2 g via INTRAVENOUS
  Filled 2023-12-07: qty 100

## 2023-12-07 MED ORDER — ACETAMINOPHEN 500 MG PO TABS
1000.0000 mg | ORAL_TABLET | ORAL | Status: AC
  Administered 2023-12-07: 1000 mg via ORAL
  Filled 2023-12-07: qty 2

## 2023-12-07 MED ORDER — SUGAMMADEX SODIUM 200 MG/2ML IV SOLN
INTRAVENOUS | Status: DC | PRN
  Administered 2023-12-07: 200 mg via INTRAVENOUS

## 2023-12-07 MED ORDER — TRAMADOL HCL 50 MG PO TABS: 50.0000 mg | ORAL_TABLET | Freq: Four times a day (QID) | ORAL | 0 refills | Status: AC | PRN

## 2023-12-07 MED ORDER — DROPERIDOL 2.5 MG/ML IJ SOLN
0.6250 mg | Freq: Once | INTRAMUSCULAR | Status: DC | PRN
Start: 2023-12-07 — End: 2023-12-07

## 2023-12-07 MED ORDER — LACTATED RINGERS IV SOLN: INTRAVENOUS | Status: DC

## 2023-12-07 MED ORDER — FENTANYL CITRATE PF 50 MCG/ML IJ SOSY: 25.0000 ug | PREFILLED_SYRINGE | INTRAMUSCULAR | Status: DC | PRN

## 2023-12-07 MED ORDER — PHENYLEPHRINE 80 MCG/ML (10ML) SYRINGE FOR IV PUSH (FOR BLOOD PRESSURE SUPPORT)
PREFILLED_SYRINGE | INTRAVENOUS | Status: DC | PRN
  Administered 2023-12-07: 80 ug via INTRAVENOUS
  Administered 2023-12-07: 160 ug via INTRAVENOUS

## 2023-12-07 SURGICAL SUPPLY — 32 items
BAG COUNTER SPONGE SURGICOUNT (BAG) IMPLANT
BLADE HEX COATED 2.75 (ELECTRODE) ×1 IMPLANT
BLADE SURG 15 STRL LF DISP TIS (BLADE) ×1 IMPLANT
BLADE SURG SZ10 CARB STEEL (BLADE) ×1 IMPLANT
CHLORAPREP W/TINT 26 (MISCELLANEOUS) ×1 IMPLANT
DERMABOND ADVANCED .7 DNX12 (GAUZE/BANDAGES/DRESSINGS) IMPLANT
DRAIN PENROSE 0.5X18 (DRAIN) ×1 IMPLANT
DRAPE LAPAROTOMY TRNSV 102X78 (DRAPES) ×1 IMPLANT
ELECT REM PT RETURN 15FT ADLT (MISCELLANEOUS) ×1 IMPLANT
GAUZE 4X4 16PLY ~~LOC~~+RFID DBL (SPONGE) IMPLANT
GAUZE SPONGE 4X4 12PLY STRL (GAUZE/BANDAGES/DRESSINGS) IMPLANT
GLOVE BIO SURGEON STRL SZ7.5 (GLOVE) ×1 IMPLANT
GOWN STRL REUS W/ TWL XL LVL3 (GOWN DISPOSABLE) ×2 IMPLANT
KIT BASIN OR (CUSTOM PROCEDURE TRAY) ×1 IMPLANT
KIT TURNOVER KIT A (KITS) IMPLANT
NDL HYPO 22X1.5 SAFETY MO (MISCELLANEOUS) IMPLANT
NDL HYPO 25X1 1.5 SAFETY (NEEDLE) ×1 IMPLANT
NEEDLE HYPO 22X1.5 SAFETY MO (MISCELLANEOUS) IMPLANT
NEEDLE HYPO 25X1 1.5 SAFETY (NEEDLE) ×1 IMPLANT
NS IRRIG 1000ML POUR BTL (IV SOLUTION) ×1 IMPLANT
PACK BASIC VI WITH GOWN DISP (CUSTOM PROCEDURE TRAY) ×1 IMPLANT
PENCIL SMOKE EVACUATOR (MISCELLANEOUS) IMPLANT
SPIKE FLUID TRANSFER (MISCELLANEOUS) IMPLANT
SPONGE T-LAP 4X18 ~~LOC~~+RFID (SPONGE) ×1 IMPLANT
STRIP CLOSURE SKIN 1/2X4 (GAUZE/BANDAGES/DRESSINGS) IMPLANT
SUT MNCRL AB 4-0 PS2 18 (SUTURE) ×1 IMPLANT
SUT VIC AB 2-0 CT1 TAPERPNT 27 (SUTURE) IMPLANT
SUT VIC AB 3-0 54XBRD REEL (SUTURE) IMPLANT
SUT VIC AB 3-0 SH 27XBRD (SUTURE) IMPLANT
SYR BULB IRRIG 60ML STRL (SYRINGE) IMPLANT
SYR CONTROL 10ML LL (SYRINGE) ×1 IMPLANT
TOWEL OR 17X26 10 PK STRL BLUE (TOWEL DISPOSABLE) ×1 IMPLANT

## 2023-12-07 NOTE — Interval H&P Note (Signed)
 History and Physical Interval Note:  12/07/2023 9:46 AM  Omar Sawyer  has presented today for surgery, with the diagnosis of CHRONICALLY INFECTED LEFT AXILLARY SEBACEOUS CYST.  The various methods of treatment have been discussed with the patient and family. After consideration of risks, benefits and other options for treatment, the patient has consented to EXCISION CHRONICALLY INFECTED SEBACEOUS CYST LEFT AXILLA as a surgical intervention.  The patient's history has been reviewed, patient examined, no change in status, stable for surgery.  I have reviewed the patient's chart and labs.  Questions were answered to the patient's satisfaction.     Abigail Miyamoto

## 2023-12-07 NOTE — Anesthesia Postprocedure Evaluation (Signed)
 Anesthesia Post Note  Patient: Omar Sawyer  Procedure(s) Performed: EXCISION MASS LEFT AXILLA (Left)     Patient location during evaluation: PACU Anesthesia Type: General Level of consciousness: awake and alert Pain management: pain level controlled Vital Signs Assessment: post-procedure vital signs reviewed and stable Respiratory status: spontaneous breathing, nonlabored ventilation and respiratory function stable Cardiovascular status: blood pressure returned to baseline Postop Assessment: no apparent nausea or vomiting Anesthetic complications: no   No notable events documented.  Last Vitals:  Vitals:   12/07/23 1145 12/07/23 1200  BP: (!) 160/82 (!) 160/82  Pulse: (!) 56 (!) 55  Resp:    Temp:    SpO2: 100% 100%    Last Pain:  Vitals:   12/07/23 1145  TempSrc:   PainSc: 0-No pain                 Shanda Howells

## 2023-12-07 NOTE — Transfer of Care (Signed)
 Immediate Anesthesia Transfer of Care Note  Patient: Omar Sawyer  Procedure(s) Performed: EXCISION MASS LEFT AXILLA (Left)  Patient Location: PACU  Anesthesia Type:General  Level of Consciousness: awake, alert , and oriented  Airway & Oxygen Therapy: Patient Spontanous Breathing  Post-op Assessment: Report given to RN  Post vital signs: Reviewed and stable  Last Vitals:  Vitals Value Taken Time  BP 150/73 12/07/23 1103  Temp    Pulse 60 12/07/23 1109  Resp 15 12/07/23 1109  SpO2 99 % 12/07/23 1109  Vitals shown include unfiled device data.  Last Pain:  Vitals:   12/07/23 0820  TempSrc: Oral         Complications: No notable events documented.

## 2023-12-07 NOTE — Discharge Instructions (Signed)
 You may shower starting tomorrow  No vigorous activity for 1 week  Ice pack, Tylenol, and ibuprofen also for pain

## 2023-12-07 NOTE — Op Note (Signed)
   Omar Sawyer 12/07/2023   Pre-op Diagnosis: CHRONICALLY INFECTED LEFT AXILLARY SEBACEOUS CYST     Post-op Diagnosis: same  Procedure(s): EXCISION 2 CM CHRONICALLY INFECTED SEBACEOUS CYST LEFT AXILLA  Surgeon(s): Abigail Miyamoto, MD  Anesthesia: Choice  Staff:  Circulator: Graciella Freer, RN Scrub Person: Lilian Kapur  Estimated Blood Loss: Minimal               Specimens: Sent to pathology  Procedure: The patient was brought to the operating identifies correct patient.  He was placed upon on the operating table and general anesthesia was induced.  His left axilla was then prepped and draped in usual sterile fashion.  I anesthetized the skin around the palpable cyst and chronic scar tissue with Marcaine.  I then performed an elliptical incision with a scalpel.  The cyst measured approximately 2 cm.  I excised the skin and underlying subcutaneous tissue containing the cyst in its entirety.  There was no purulence involved.  There were no other underlying abnormalities identified.  I achieved hemostasis with the cautery.  I then closed the subcutaneous tissue with interrupted 3-0 Vicryl sutures and closed the skin with a running 4-0 Monocryl.  Dermabond was then applied.  The patient tolerated the procedure well.  All the counts were correct at the end of the procedure.  The patient was then extubated in the operating room and taken in a stable condition to the recovery room.          Abigail Miyamoto   Date: 12/07/2023  Time: 10:51 AM

## 2023-12-07 NOTE — Anesthesia Procedure Notes (Signed)
 Procedure Name: Intubation Date/Time: 12/07/2023 10:24 AM  Performed by: Randa Evens, CRNAPre-anesthesia Checklist: Patient identified, Emergency Drugs available, Suction available and Patient being monitored Patient Re-evaluated:Patient Re-evaluated prior to induction Oxygen Delivery Method: Circle System Utilized Preoxygenation: Pre-oxygenation with 100% oxygen Induction Type: IV induction Ventilation: Mask ventilation without difficulty Laryngoscope Size: Glidescope and 4 (Lopro S4) Grade View: Grade I Tube type: Oral Tube size: 7.0 mm Number of attempts: 1 Airway Equipment and Method: Stylet and Oral airway Placement Confirmation: ETT inserted through vocal cords under direct vision, positive ETCO2 and breath sounds checked- equal and bilateral Secured at: 22 cm Tube secured with: Tape Dental Injury: Teeth and Oropharynx as per pre-operative assessment

## 2023-12-08 ENCOUNTER — Encounter (HOSPITAL_COMMUNITY): Payer: Self-pay | Admitting: Surgery

## 2023-12-08 LAB — SURGICAL PATHOLOGY

## 2024-03-16 ENCOUNTER — Ambulatory Visit (HOSPITAL_COMMUNITY)
Admission: RE | Admit: 2024-03-16 | Discharge: 2024-03-16 | Disposition: A | Discharge status: Home / Self Care | Source: Ambulatory Visit | Attending: Cardiovascular Disease | Admitting: Cardiovascular Disease

## 2024-03-16 DIAGNOSIS — I6523 Occlusion and stenosis of bilateral carotid arteries: Secondary | ICD-10-CM | POA: Insufficient documentation

## 2024-03-21 ENCOUNTER — Ambulatory Visit: Payer: Self-pay | Admitting: Cardiology

## 2024-09-20 ENCOUNTER — Other Ambulatory Visit: Payer: Self-pay | Admitting: Nurse Practitioner

## 2024-09-26 NOTE — Telephone Encounter (Signed)
 Overdue Labs. Can get at F/U appt in February 2026

## 2024-10-01 ENCOUNTER — Other Ambulatory Visit: Payer: Self-pay | Admitting: Nurse Practitioner

## 2024-10-03 NOTE — Telephone Encounter (Signed)
 In accordance with refill protocols, please review and address the following requirements before this medication refill can be authorized:  Labs  Pt needs labs within 365 days within normal range. Pt has an upcoming appt with Dr. Jeffrie on 10/18/2024

## 2024-10-18 ENCOUNTER — Ambulatory Visit: Admitting: Cardiology
